# Patient Record
Sex: Male | Born: 1940 | Race: White | Hispanic: No | Marital: Married | Smoking: Never smoker
Health system: Southern US, Community
[De-identification: ages and names within clinical notes are randomized; demographics above are authoritative.]

## PROBLEM LIST (undated history)

## (undated) DIAGNOSIS — D126 Benign neoplasm of colon, unspecified: Secondary | ICD-10-CM

## (undated) DIAGNOSIS — N400 Enlarged prostate without lower urinary tract symptoms: Secondary | ICD-10-CM

## (undated) DIAGNOSIS — E785 Hyperlipidemia, unspecified: Secondary | ICD-10-CM

## (undated) DIAGNOSIS — N429 Disorder of prostate, unspecified: Secondary | ICD-10-CM

## (undated) DIAGNOSIS — C439 Malignant melanoma of skin, unspecified: Secondary | ICD-10-CM

## (undated) DIAGNOSIS — E0500 Thyrotoxicosis with diffuse goiter without thyrotoxic crisis or storm: Secondary | ICD-10-CM

## (undated) DIAGNOSIS — Z87442 Personal history of urinary calculi: Secondary | ICD-10-CM

## (undated) DIAGNOSIS — I6529 Occlusion and stenosis of unspecified carotid artery: Secondary | ICD-10-CM

## (undated) DIAGNOSIS — G43119 Migraine with aura, intractable, without status migrainosus: Secondary | ICD-10-CM

## (undated) DIAGNOSIS — K219 Gastro-esophageal reflux disease without esophagitis: Secondary | ICD-10-CM

## (undated) DIAGNOSIS — R0600 Dyspnea, unspecified: Secondary | ICD-10-CM

## (undated) DIAGNOSIS — J302 Other seasonal allergic rhinitis: Secondary | ICD-10-CM

## (undated) DIAGNOSIS — M1990 Unspecified osteoarthritis, unspecified site: Secondary | ICD-10-CM

## (undated) DIAGNOSIS — K648 Other hemorrhoids: Secondary | ICD-10-CM

## (undated) DIAGNOSIS — I639 Cerebral infarction, unspecified: Secondary | ICD-10-CM

## (undated) DIAGNOSIS — E079 Disorder of thyroid, unspecified: Secondary | ICD-10-CM

## (undated) HISTORY — PX: LITHOTRIPSY: SUR834

## (undated) HISTORY — DX: Benign neoplasm of colon, unspecified: D12.6

## (undated) HISTORY — PX: BACK SURGERY: SHX140

## (undated) HISTORY — DX: Disorder of prostate, unspecified: N42.9

## (undated) HISTORY — PX: ESOPHAGOGASTRODUODENOSCOPY: SHX1529

## (undated) HISTORY — DX: Occlusion and stenosis of unspecified carotid artery: I65.29

## (undated) HISTORY — PX: MELANOMA EXCISION: SHX5266

## (undated) HISTORY — DX: Hyperlipidemia, unspecified: E78.5

## (undated) HISTORY — DX: Unspecified osteoarthritis, unspecified site: M19.90

## (undated) HISTORY — PX: COLONOSCOPY: SHX174

## (undated) HISTORY — DX: Benign prostatic hyperplasia without lower urinary tract symptoms: N40.0

## (undated) HISTORY — DX: Migraine with aura, intractable, without status migrainosus: G43.119

## (undated) HISTORY — PX: HYDROCELE EXCISION: SHX482

## (undated) HISTORY — DX: Disorder of thyroid, unspecified: E07.9

## (undated) HISTORY — DX: Other hemorrhoids: K64.8

---

## 1997-11-19 ENCOUNTER — Emergency Department (HOSPITAL_COMMUNITY): Admission: EM | Admit: 1997-11-19 | Discharge: 1997-11-19 | Payer: Self-pay | Admitting: Emergency Medicine

## 2003-08-06 ENCOUNTER — Ambulatory Visit (HOSPITAL_COMMUNITY): Admission: EM | Admit: 2003-08-06 | Discharge: 2003-08-06 | Payer: Self-pay | Admitting: Emergency Medicine

## 2003-08-17 ENCOUNTER — Ambulatory Visit (HOSPITAL_COMMUNITY): Admission: RE | Admit: 2003-08-17 | Discharge: 2003-08-17 | Payer: Self-pay | Admitting: Urology

## 2009-09-04 ENCOUNTER — Ambulatory Visit (HOSPITAL_BASED_OUTPATIENT_CLINIC_OR_DEPARTMENT_OTHER): Admission: RE | Admit: 2009-09-04 | Discharge: 2009-09-04 | Payer: Self-pay | Admitting: Urology

## 2010-10-09 LAB — POCT HEMOGLOBIN-HEMACUE: Hemoglobin: 15.4 g/dL (ref 13.0–17.0)

## 2010-12-06 NOTE — Op Note (Signed)
NAMECHIEF, WALKUP                            ACCOUNT NO.:  [id number]   MEDICAL RECORD NO.:  [id number]                   PATIENT TYPE:  EMS   LOCATION:  ED                                   FACILITY:  [facility]   PHYSICIAN:  Bertram Millard. Dahlstedt, M.D.          DATE OF BIRTH:  09-22-1940   DATE OF PROCEDURE:  08/06/2003  DATE OF DISCHARGE:                                 OPERATIVE REPORT   PREOPERATIVE DIAGNOSIS:  Right upper ureteral stone.   POSTOPERATIVE DIAGNOSIS:  Right upper ureteral stone.   PRINCIPAL PROCEDURE:  1. Cystoscopy.  2. Double-J stent placement.  3. Interpretive fluoroscopy.   SURGEON:  Bertram Millard. Dahlstedt, M.D.   ANESTHESIA:  General endotracheal.   COMPLICATIONS:  None.   BRIEF HISTORY:  A 70 year old gentleman, who has had intermittent right back  pain for the past 2-3 weeks.  He had a flare-up of this about a week ago and  then this morning at 8:00, had severe onset of right flank pain associated  with nausea and vomiting.  It took quite a bit of Dilaudid in the emergency  room here at [facility] to get the patient comfortable.  Evaluation  revealed a right UPJ stone, measuring about 7 mm in size as well as one  other right renal stone.   I saw the patient in the emergency room.  As he has had intermittent pain  for a few weeks and a moderate-sized stone as well as significant  hydronephrosis, I recommended stent placement followed by lithotripsy.  I  would prefer in situ lithotripsy tomorrow, but the patient has been on  aspirin.   He presents at this time for cystoscopy and double-J stent placement.  Risks  and complications as well as alternatives have been discussed.   DESCRIPTION OF PROCEDURE:  The patient was administered 1 g of Ancef in the  holding area and taken to the operating room where a general anesthetic was  administered.  He was placed in the dorsal lithotomy position.  Genitalia  and perineum were prepped and draped.  A 22 French  panendoscope was advanced  through his urethra which was normal.  Prostate was nonobstructive.  The  bladder was then entered and inspected circumferentially.  It was normal.  The right ureteral orifice was cannulated with a 0.038 inch guidewire.  This  was navigated past the stone which was quite tight at the UPJ.  Over top of  this, a 6 French x 24 cm double-J stent was placed.  The string  was removed.  Good curls were seen proximally and distally using  fluoroscopic and cystoscopic views, respectively.  The bladder was drained  and the procedure terminated.  The patient tolerated the procedure well.  He  was awakened and taken to the PACU in stable condition.  Bertram Millard. Dahlstedt, M.D.    SMD/MEDQ  D:  08/06/2003  T:  08/06/2003  Job:  [id number]   cc:   Ernestina Penna, M.D.  [address] [place]  [place] [address]  Fax: [phone]

## 2012-01-26 ENCOUNTER — Ambulatory Visit: Payer: Medicare Other | Attending: Orthopedic Surgery | Admitting: Physical Therapy

## 2012-01-26 DIAGNOSIS — R5381 Other malaise: Secondary | ICD-10-CM | POA: Insufficient documentation

## 2012-01-26 DIAGNOSIS — IMO0001 Reserved for inherently not codable concepts without codable children: Secondary | ICD-10-CM | POA: Insufficient documentation

## 2012-01-26 DIAGNOSIS — M25579 Pain in unspecified ankle and joints of unspecified foot: Secondary | ICD-10-CM | POA: Insufficient documentation

## 2012-01-30 ENCOUNTER — Ambulatory Visit: Payer: Medicare Other | Admitting: *Deleted

## 2012-02-02 ENCOUNTER — Ambulatory Visit: Payer: Medicare Other | Admitting: Physical Therapy

## 2012-02-06 ENCOUNTER — Ambulatory Visit: Payer: Medicare Other | Admitting: *Deleted

## 2012-11-13 ENCOUNTER — Other Ambulatory Visit: Payer: Self-pay | Admitting: Family Medicine

## 2012-11-15 NOTE — Telephone Encounter (Signed)
LAST OV 6/13

## 2012-12-27 ENCOUNTER — Encounter: Payer: Self-pay | Admitting: General Practice

## 2012-12-27 ENCOUNTER — Ambulatory Visit (INDEPENDENT_AMBULATORY_CARE_PROVIDER_SITE_OTHER): Payer: Medicare Other | Admitting: General Practice

## 2012-12-27 VITALS — BP 138/76 | HR 70 | Temp 97.3°F | Ht 67.0 in | Wt 183.0 lb

## 2012-12-27 DIAGNOSIS — H6120 Impacted cerumen, unspecified ear: Secondary | ICD-10-CM

## 2012-12-27 DIAGNOSIS — H6122 Impacted cerumen, left ear: Secondary | ICD-10-CM

## 2012-12-27 NOTE — Progress Notes (Signed)
  Subjective:    Patient ID: Jacob Conner, male    DOB: [birth year], 72 y.o.   MRN: [id number]  HPI Presents today with ear pain and clogged sensation which started one week ago. Reports cleaning ears with Q-tip and symptoms started. Denies OTC medications.    Review of Systems  Constitutional: Negative for fever and chills.  HENT: Positive for ear pain. Negative for hearing loss, congestion, rhinorrhea, sneezing, neck pain, neck stiffness, postnasal drip and sinus pressure.        Left ear pain and discomfort  Eyes: Negative for pain.  Respiratory: Negative for chest tightness and shortness of breath.   Cardiovascular: Negative for chest pain and palpitations.  All other systems reviewed and are negative.       Objective:   Physical Exam  Constitutional: He is oriented to person, place, and time. He appears well-developed and well-nourished.  HENT:  Head: Normocephalic and atraumatic.  Right Ear: Tympanic membrane, external ear and ear canal normal.  Left Ear: There is tenderness.  Mouth/Throat: Oropharynx is clear and moist.  Cerumen impaction noted to left ear canal, TM non visible at this time  Eyes: EOM are normal.  Cardiovascular: Normal rate, regular rhythm and normal heart sounds.   Pulmonary/Chest: Effort normal and breath sounds normal. No respiratory distress. He exhibits no tenderness.  Neurological: He is alert and oriented to person, place, and time.  Skin: Skin is warm and dry.  Psychiatric: He has a normal mood and affect.          Assessment & Plan:  1. Cerumen impaction, left -left ear irrigation (small amount of cerumen removed) -TM more visible -patient reports less clogged sensation -RTO on Wednesday or Thursday for recheck and sooner if symptoms worsen -refrain from placing any objects in ears for cleaning -Patient verbalized understanding -Coralie Keens, FNP-C

## 2012-12-27 NOTE — Patient Instructions (Signed)
Cerumen Impaction A cerumen impaction is when the wax in your ear forms a plug. This plug usually causes reduced hearing. Sometimes it also causes an earache or dizziness. Removing a cerumen impaction can be difficult and painful. The wax sticks to the ear canal. The canal is sensitive and bleeds easily. If you try to remove a heavy wax buildup with a cotton tipped swab, you may push it in further. Irrigation with water, suction, and small ear curettes may be used to clear out the wax. If the impaction is fixed to the skin in the ear canal, ear drops may be needed for a few days to loosen the wax. People who build up a lot of wax frequently can use ear wax removal products available in your local drugstore. SEEK MEDICAL CARE IF:  You develop an earache, increased hearing loss, or marked dizziness. Document Released: 08/14/2004 Document Revised: 09/29/2011 Document Reviewed: 10/04/2009 Exit[employer] Patient Information 2014 [employer].  

## 2012-12-31 ENCOUNTER — Other Ambulatory Visit: Payer: Self-pay | Admitting: General Practice

## 2012-12-31 DIAGNOSIS — H6122 Impacted cerumen, left ear: Secondary | ICD-10-CM

## 2013-03-25 ENCOUNTER — Other Ambulatory Visit: Payer: Self-pay | Admitting: Family Medicine

## 2013-03-29 NOTE — Telephone Encounter (Signed)
No labwork since 2012

## 2013-04-05 ENCOUNTER — Ambulatory Visit (INDEPENDENT_AMBULATORY_CARE_PROVIDER_SITE_OTHER): Payer: Medicare Other | Admitting: Family Medicine

## 2013-04-05 ENCOUNTER — Encounter: Payer: Self-pay | Admitting: Family Medicine

## 2013-04-05 VITALS — BP 116/63 | HR 77 | Temp 97.7°F | Wt 186.3 lb

## 2013-04-05 DIAGNOSIS — H109 Unspecified conjunctivitis: Secondary | ICD-10-CM

## 2013-04-05 MED ORDER — POLYMYXIN B-TRIMETHOPRIM 10000-0.1 UNIT/ML-% OP SOLN: 1.0000 [drp] | OPHTHALMIC | Status: DC

## 2013-04-05 NOTE — Patient Instructions (Signed)

## 2013-04-05 NOTE — Progress Notes (Signed)
  Subjective:    Patient ID: Jacob Conner, male    DOB: [birth year], 72 y.o.   MRN: [id number]  HPI  This 72 y.o. male presents for evaluation of eye swelling and irritation for A week.  His wife states she has noticed that he has swelling and redness in Is bilateral conjunctival sacs..  Review of Systems C/o bilateral eye irritation and redness.   No chest pain, SOB, HA, dizziness, vision change, N/V, diarrhea, constipation, dysuria, urinary urgency or frequency, myalgias, arthralgias or rash.  Objective:   Physical Exam Vital signs noted  Well developed well nourished male.  HEENT - Head atraumatic Normocephalic                Eyes - PERRLA, Conjuctiva - injected and with edema bilateral Sclera- Clear EOMI                Ears - EAC's Wnl TM's Wnl Gross Hearing WNL                Nose - Nares patent                 Throat - oropharanx wnl Respiratory - Lungs CTA bilateral Cardiac - RRR S1 and S2 without murmur        Assessment & Plan:  Conjunctivitis - Plan: trimethoprim-polymyxin b (POLYTRIM) ophthalmic solution 1-2 gtt's OU qid x 7days

## 2013-04-19 ENCOUNTER — Ambulatory Visit (INDEPENDENT_AMBULATORY_CARE_PROVIDER_SITE_OTHER): Payer: Medicare Other | Admitting: Family Medicine

## 2013-04-19 ENCOUNTER — Ambulatory Visit (INDEPENDENT_AMBULATORY_CARE_PROVIDER_SITE_OTHER): Payer: Medicare Other

## 2013-04-19 ENCOUNTER — Encounter: Payer: Self-pay | Admitting: Family Medicine

## 2013-04-19 VITALS — BP 135/78 | HR 67 | Temp 97.1°F | Ht 66.0 in | Wt 185.0 lb

## 2013-04-19 DIAGNOSIS — E785 Hyperlipidemia, unspecified: Secondary | ICD-10-CM | POA: Insufficient documentation

## 2013-04-19 DIAGNOSIS — N4340 Spermatocele of epididymis, unspecified: Secondary | ICD-10-CM | POA: Insufficient documentation

## 2013-04-19 DIAGNOSIS — H109 Unspecified conjunctivitis: Secondary | ICD-10-CM

## 2013-04-19 DIAGNOSIS — Z77090 Contact with and (suspected) exposure to asbestos: Secondary | ICD-10-CM

## 2013-04-19 DIAGNOSIS — N400 Enlarged prostate without lower urinary tract symptoms: Secondary | ICD-10-CM

## 2013-04-19 DIAGNOSIS — G8929 Other chronic pain: Secondary | ICD-10-CM | POA: Insufficient documentation

## 2013-04-19 DIAGNOSIS — Z8669 Personal history of other diseases of the nervous system and sense organs: Secondary | ICD-10-CM

## 2013-04-19 DIAGNOSIS — R609 Edema, unspecified: Secondary | ICD-10-CM

## 2013-04-19 DIAGNOSIS — Z0000 Encounter for general adult medical examination without abnormal findings: Secondary | ICD-10-CM

## 2013-04-19 DIAGNOSIS — Z86010 Personal history of colonic polyps: Secondary | ICD-10-CM | POA: Insufficient documentation

## 2013-04-19 DIAGNOSIS — W57XXXA Bitten or stung by nonvenomous insect and other nonvenomous arthropods, initial encounter: Secondary | ICD-10-CM

## 2013-04-19 DIAGNOSIS — M25559 Pain in unspecified hip: Secondary | ICD-10-CM

## 2013-04-19 DIAGNOSIS — Z23 Encounter for immunization: Secondary | ICD-10-CM

## 2013-04-19 LAB — POCT CBC
Granulocyte percent: 65.7 %G (ref 37–80)
HCT, POC: 44.8 % (ref 43.5–53.7)
Hemoglobin: 14.5 g/dL (ref 14.1–18.1)
Lymph, poc: 1.9 (ref 0.6–3.4)
MCH, POC: 28.9 pg (ref 27–31.2)
MCHC: 32.4 g/dL (ref 31.8–35.4)
MCV: 89.2 fL (ref 80–97)
MPV: 6.4 fL (ref 0–99.8)
POC Granulocyte: 4.3 (ref 2–6.9)
POC LYMPH PERCENT: 28.8 %L (ref 10–50)
Platelet Count, POC: 338 10*3/uL (ref 142–424)
RBC: 5 M/uL (ref 4.69–6.13)
RDW, POC: 12.2 %
WBC: 6.6 10*3/uL (ref 4.6–10.2)

## 2013-04-19 LAB — POCT URINALYSIS DIPSTICK
Bilirubin, UA: NEGATIVE
Blood, UA: NEGATIVE
Glucose, UA: NEGATIVE
Ketones, UA: NEGATIVE
Leukocytes, UA: NEGATIVE
Nitrite, UA: NEGATIVE
Protein, UA: NEGATIVE
Spec Grav, UA: 1.015
Urobilinogen, UA: NEGATIVE
pH, UA: 5

## 2013-04-19 LAB — POCT UA - MICROSCOPIC ONLY
Bacteria, U Microscopic: NEGATIVE
Casts, Ur, LPF, POC: NEGATIVE
Crystals, Ur, HPF, POC: NEGATIVE
Epithelial cells, urine per micros: NEGATIVE
Mucus, UA: NEGATIVE
RBC, urine, microscopic: NEGATIVE
WBC, Ur, HPF, POC: NEGATIVE
Yeast, UA: NEGATIVE

## 2013-04-19 MED ORDER — CEPHALEXIN 500 MG PO CAPS: 500.0000 mg | ORAL_CAPSULE | Freq: Three times a day (TID) | ORAL | Status: DC

## 2013-04-19 MED ORDER — FINASTERIDE 5 MG PO TABS: ORAL_TABLET | ORAL | Status: DC

## 2013-04-19 MED ORDER — TOBRAMYCIN 0.3 % OP SOLN: 1.0000 [drp] | OPHTHALMIC | Status: DC

## 2013-04-19 NOTE — Progress Notes (Addendum)
Subjective:    Patient ID: Jacob Conner, male    DOB: April 05, 1941, 72 y.o.   MRN: [id number]  HPIpt here today for complete physical. Patient complains of conjunctivitis for a couple weeks with no response with current treatment. Patient also complains of edema in the lower ankles for 2-3 months. Another complaint is continuous fecal leakage after bowel movements. There is no blood noted. Another important bit of information has to do with taking Naprosyn 500 mg twice daily.    There are no active problems to display for this patient.  Outpatient Encounter Prescriptions as of 04/19/2013  Medication Sig Dispense Refill  . aspirin EC 81 MG tablet Take 81 mg by mouth daily.      . clobetasol cream (TEMOVATE) 0.05 %       . finasteride (PROSCAR) 5 MG tablet TAKE 1 TABLET ONCE DAILY.  30 tablet  0  . naproxen (NAPROSYN) 500 MG tablet       . trimethoprim-polymyxin b (POLYTRIM) ophthalmic solution Place 1 drop into the left eye every 4 (four) hours.  10 mL  0  . UNABLE TO FIND Take 1 tablet by mouth daily. Med Name: occuvit- eye vit      . [DISCONTINUED] Vitamin Mixture (VITAMIN E COMPLETE PO) Take by mouth.       No facility-administered encounter medications on file as of 04/19/2013.       Review of Systems  Constitutional: Negative.   HENT: Negative.   Eyes: Positive for discharge (matted and swollen every am).  Respiratory: Negative.   Cardiovascular: Positive for leg swelling.  Gastrointestinal: Negative.   Endocrine: Negative.   Genitourinary: Negative.   Musculoskeletal: Negative.   Skin: Negative.   Allergic/Immunologic: Negative.   Neurological: Negative.   Hematological: Negative.   Psychiatric/Behavioral: Negative.        Objective:   Physical Exam  Nursing note and vitals reviewed. Constitutional: He is oriented to person, place, and time. He appears well-developed and well-nourished. No distress.  HENT:  Head: Normocephalic and atraumatic.  Right Ear: External  ear normal.  Left Ear: External ear normal.  Nose: Nose normal.  Mouth/Throat: Oropharynx is clear and moist. No oropharyngeal exudate.  Eyes: EOM are normal. Pupils are equal, round, and reactive to light. Right eye exhibits discharge. Left eye exhibits discharge. No scleral icterus.  Patient has bilateral edema in both eyes with red conjunctiva and matting of both eyes  Neck: Normal range of motion. Neck supple. No tracheal deviation present. No thyromegaly present.  Cardiovascular: Normal rate, regular rhythm, normal heart sounds and intact distal pulses.  Exam reveals no gallop and no friction rub.   No murmur heard. At 72 per minute  Pulmonary/Chest: Effort normal and breath sounds normal. No respiratory distress. He has no wheezes. He has no rales. He exhibits no tenderness.  Abdominal: Soft. Bowel sounds are normal. He exhibits no mass. There is no tenderness. There is no rebound and no guarding.  Genitourinary: Rectum normal and penis normal.  The prostate is slightly enlarged without lumps. There were no rectal masses. There were no external hemorrhoids. 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  Musculoskeletal: Normal range of motion. He exhibits edema (slight ankle edema bilaterally). He exhibits no tenderness.  Lymphadenopathy:    He has no cervical adenopathy.  Neurological: He is alert and oriented to person, place, and time. He has normal reflexes. No cranial nerve deficit.  Skin: Skin is warm and dry. Rash noted. There is erythema. No pallor.  The  patient has what appears to be multiple bites on both lower extremities and abdomen  Psychiatric: He has a normal mood and affect. His behavior is normal. Judgment and thought content normal.   BP 135/78  Pulse 67  Temp(Src) 97.1 F (36.2 C) (Oral)  Ht 5\' 6"  (1.676 m)  Wt 185 lb (83.915 kg)  BMI 29.87 kg/m2  ZOX:[id number] from previous tracings  [facility] reading (PRIMARY) by  Dr.Jabree Pernice; chest x-ray; degenerative changes in spine  otherwise normal                                      Assessment & Plan:  1. Annual physical exam - POCT CBC - Hepatic function panel - BMP8+EGFR - PSA, total and free - NMR, lipoprofile - Vit D  25 hydroxy (rtn osteoporosis monitoring) - POCT UA - Microscopic Only - POCT urinalysis dipstick - DG Chest 2 View; Future - EKG 12-Lead  2. Hyperlipidemia - Hepatic function panel - BMP8+EGFR - NMR, lipoprofile  3. BPH (benign prostatic hyperplasia) - PSA, total and free - POCT UA - Microscopic Only - POCT urinalysis dipstick  4. Chronic arthralgias of knees and hips, unspecified laterality  5. 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 Spermatocele  7. Hx of colonic polyp  8. History of migraine headaches  9. Edema  10. Conjunctivitis - tobramycin (TOBREX) 0.3 % ophthalmic solution; Place 1-2 drops into both eyes every 4 (four) hours.  Dispense: 5 mL; Refill: 1 - cephALEXin (KEFLEX) 500 MG capsule; Take 1 capsule (500 mg total) by mouth 3 (three) times daily.  Dispense: 30 capsule; Refill: 1  11. Multiple insect bites - cephALEXin (KEFLEX) 500 MG capsule; Take 1 capsule (500 mg total) by mouth 3 (three) times daily.   Dispense: 30 capsule; Refill: 1  Orders Placed This Encounter  Procedures  . DG Chest 2 View    Standing Status: Future     Number of Occurrences: 1     Standing Expiration Date: 06/19/2014    Order Specific Question:  Reason for Exam (SYMPTOM  OR DIAGNOSIS REQUIRED)    Answer:  annual    Order Specific Question:  Preferred imaging location?    Answer:  Internal  . Pneumococcal polysaccharide vaccine 23-valent greater than or equal to 2yo subcutaneous/IM  . Hepatic function panel  . BMP8+EGFR  . PSA, total and free  . NMR, lipoprofile  . Vit D  25 hydroxy (rtn osteoporosis monitoring)  . POCT CBC  . POCT UA - Microscopic Only  . POCT urinalysis dipstick  . EKG 12-Lead   Patient Instructions  Continue current medications. Continue good therapeutic lifestyle  changes.  Fall precautions discussed with patient. Follow up as planned and earlier as needed.     Return to clinic in 2 weeks for followup Discontinue Naprosyn as this may be contributing to edema Bring back FOBT

## 2013-04-19 NOTE — Patient Instructions (Signed)
Continue current medications. Continue good therapeutic lifestyle changes.  Fall precautions discussed with patient. Follow up as planned and earlier as needed.   

## 2013-04-21 LAB — BMP8+EGFR
BUN/Creatinine Ratio: 20 (ref 10–22)
BUN: 16 mg/dL (ref 8–27)
CO2: 28 mmol/L (ref 18–29)
Calcium: 9.8 mg/dL (ref 8.6–10.2)
Chloride: 100 mmol/L (ref 97–108)
Creatinine, Ser: 0.79 mg/dL (ref 0.76–1.27)
GFR calc Af Amer: 102 mL/min/{1.73_m2} (ref >59)
GFR calc non Af Amer: 88 mL/min/{1.73_m2} (ref >59)
Glucose: 93 mg/dL (ref 65–99)
Potassium: 4.4 mmol/L (ref 3.5–5.2)
Sodium: 142 mmol/L (ref 134–144)

## 2013-04-21 LAB — NMR, LIPOPROFILE
Cholesterol: 192 mg/dL (ref <200)
HDL Cholesterol by NMR: 35 mg/dL — ABNORMAL LOW (ref >=40)
HDL Particle Number: 26.7 umol/L — ABNORMAL LOW (ref >=30.5)
LDL Particle Number: 2084 nmol/L — ABNORMAL HIGH (ref <1000)
LDL Size: 20.1 nm — ABNORMAL LOW (ref >20.5)
LDLC SERPL CALC-MCNC: 123 mg/dL — ABNORMAL HIGH (ref <100)
LP-IR Score: 48 — ABNORMAL HIGH (ref <=45)
Small LDL Particle Number: 1312 nmol/L — ABNORMAL HIGH (ref <=527)
Triglycerides by NMR: 171 mg/dL — ABNORMAL HIGH (ref <150)

## 2013-04-21 LAB — HEPATIC FUNCTION PANEL
ALT: 16 IU/L (ref 0–44)
AST: 18 IU/L (ref 0–40)
Albumin: 4.5 g/dL (ref 3.5–4.8)
Alkaline Phosphatase: 126 IU/L — ABNORMAL HIGH (ref 39–117)
Bilirubin, Direct: 0.1 mg/dL (ref 0.00–0.40)
Total Bilirubin: 0.5 mg/dL (ref 0.0–1.2)
Total Protein: 6.5 g/dL (ref 6.0–8.5)

## 2013-04-21 LAB — PSA, TOTAL AND FREE
PSA, Free Pct: 24.3 %
PSA, Free: 0.17 ng/mL
PSA: 0.7 ng/mL (ref 0.0–4.0)

## 2013-04-21 LAB — VITAMIN D 25 HYDROXY (VIT D DEFICIENCY, FRACTURES): Vit D, 25-Hydroxy: 28.4 ng/mL — ABNORMAL LOW (ref 30.0–100.0)

## 2013-04-21 NOTE — Progress Notes (Signed)
Notified pt's wife of results Verbalizes understanding

## 2013-05-10 ENCOUNTER — Ambulatory Visit (INDEPENDENT_AMBULATORY_CARE_PROVIDER_SITE_OTHER): Payer: Medicare Other | Admitting: Family Medicine

## 2013-05-10 ENCOUNTER — Encounter: Payer: Self-pay | Admitting: Family Medicine

## 2013-05-10 VITALS — BP 117/65 | HR 82 | Temp 97.3°F | Ht 66.0 in | Wt 183.0 lb

## 2013-05-10 DIAGNOSIS — N400 Enlarged prostate without lower urinary tract symptoms: Secondary | ICD-10-CM

## 2013-05-10 DIAGNOSIS — R5381 Other malaise: Secondary | ICD-10-CM

## 2013-05-10 DIAGNOSIS — J309 Allergic rhinitis, unspecified: Secondary | ICD-10-CM

## 2013-05-10 DIAGNOSIS — E559 Vitamin D deficiency, unspecified: Secondary | ICD-10-CM

## 2013-05-10 DIAGNOSIS — M7989 Other specified soft tissue disorders: Secondary | ICD-10-CM

## 2013-05-10 DIAGNOSIS — E785 Hyperlipidemia, unspecified: Secondary | ICD-10-CM

## 2013-05-10 DIAGNOSIS — R42 Dizziness and giddiness: Secondary | ICD-10-CM

## 2013-05-10 MED ORDER — MECLIZINE HCL 12.5 MG PO TABS: ORAL_TABLET | ORAL | Status: DC

## 2013-05-10 MED ORDER — FLUTICASONE PROPIONATE 50 MCG/ACT NA SUSP: NASAL | Status: DC

## 2013-05-10 MED ORDER — ATORVASTATIN CALCIUM 10 MG PO TABS: 10.0000 mg | ORAL_TABLET | Freq: Every day | ORAL | Status: DC

## 2013-05-10 MED ORDER — VITAMIN D (ERGOCALCIFEROL) 1.25 MG (50000 UNIT) PO CAPS: 50000.0000 [IU] | ORAL_CAPSULE | ORAL | Status: DC

## 2013-05-10 NOTE — Progress Notes (Signed)
Subjective:    Patient ID: Jacob Conner, male    DOB: 1940-12-08, 72 y.o.   MRN: [id number]  HPI Patient here today due to film over eyes, legs swelling, and to go over recent labwork. Weight was 185 pounds the end of September is 183 pounds today. He does complain of some dizziness especially with certain head movements. He denies any symptoms of allergic rhinitis. He does complain of some increased shortness of breath with activity but no chest pain or tightness. He also complains of being issues specifically when he arises in the morning how slow it is to get his urine started. He also has symptoms of urgency. Since the last visit he has not taken any more Naprosyn.    Patient Active Problem List   Diagnosis Date Noted  . BPH (benign prostatic hyperplasia) 04/19/2013  . Hyperlipidemia 04/19/2013  . 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 Spermatocele 04/19/2013  . Hx of colonic polyp 04/19/2013  . History of migraine headaches 04/19/2013   Outpatient Encounter Prescriptions as of 05/10/2013  Medication Sig Dispense Refill  . aspirin EC 81 MG tablet Take 81 mg by mouth daily.      . clobetasol cream (TEMOVATE) 0.05 %       . finasteride (PROSCAR) 5 MG tablet TAKE 1 TABLET ONCE DAILY.  30 tablet  6  . naproxen (NAPROSYN) 500 MG tablet       . tobramycin (TOBREX) 0.3 % ophthalmic solution Place 1-2 drops into both eyes every 4 (four) hours.  5 mL  1  . trimethoprim-polymyxin b (POLYTRIM) ophthalmic solution Place 1 drop into the left eye every 4 (four) hours.  10 mL  0  . UNABLE TO FIND Take 1 tablet by mouth daily. Med Name: occuvit- eye vit      . [DISCONTINUED] cephALEXin (KEFLEX) 500 MG capsule Take 1 capsule (500 mg total) by mouth 3 (three) times daily.  30 capsule  1   No facility-administered encounter medications on file as of 05/10/2013.    Review of Systems  Constitutional: Negative.   HENT: Negative.   Eyes: Positive for discharge  (clear film).       "film" over eyes   Respiratory: Negative.   Cardiovascular: Positive for leg swelling (worse at night).  Gastrointestinal: Negative.   Endocrine: Negative.   Genitourinary: Negative.   Musculoskeletal: Negative.   Skin: Negative.   Allergic/Immunologic: Negative.   Neurological: Negative.   Hematological: Negative.   Psychiatric/Behavioral: Negative.        Objective:   Physical Exam  Nursing note and vitals reviewed. Constitutional: He is oriented to person, place, and time. He appears well-developed and well-nourished. No distress.  HENT:  Head: Normocephalic and atraumatic.  Right Ear: External ear normal.  Left Ear: External ear normal.  Mouth/Throat: Oropharynx is clear and moist. No oropharyngeal exudate.  Left ear has slight cerumen present There was some nasal congestion bilateral  Eyes: Conjunctivae and EOM are normal. Right eye exhibits no discharge. Left eye exhibits no discharge. No scleral icterus.  Patient continues to have congestion bilaterally  Neck: Normal range of motion. Neck supple. No thyromegaly present.  Cardiovascular: Normal rate, regular rhythm and normal heart sounds.  Exam reveals no gallop and no friction rub.   No murmur heard. At 84 per minute  Pulmonary/Chest: Effort normal and breath sounds normal. No respiratory distress. He has no wheezes. He has no rales. He exhibits no tenderness.  Musculoskeletal: Normal range  of motion. He exhibits no tenderness. Edema: minimal edema today.  Lymphadenopathy:    He has no cervical adenopathy.  Neurological: He is alert and oriented to person, place, and time.  Skin: Skin is warm. No rash noted. He is not diaphoretic. No erythema. No pallor.  Healing insect bites on lower extremity  Psychiatric: He has a normal mood and affect. His behavior is normal. Judgment and thought content normal.   BP 117/65  Pulse 82  Temp(Src) 97.3 F (36.3 C) (Oral)  Ht 5\' 6"  (1.676 m)  Wt 183 lb  (83.008 kg)  BMI 29.55 kg/m2        Assessment & Plan:   1. Other malaise and fatigue   2. Leg swelling   3. Dizziness and giddiness   4. BPH (benign prostatic hyperplasia)   5. Allergic rhinitis   6. Hyperlipidemia   7. Vitamin D deficiency    Orders Placed This Encounter  Procedures  . Thyroid Panel With TSH   We will also make referrals to the cardiologist for a stress test and the urologist for his BPH  Meds ordered this encounter  Medications  . fluticasone (FLONASE) 50 MCG/ACT nasal spray    Sig: 1-2 sprays each nostril at bedtime    Dispense:  16 g    Refill:  6  . meclizine (ANTIVERT) 12.5 MG tablet    Sig: 1   4 times daily with meals and at bedtime    Dispense:  40 tablet    Refill:  1  . Vitamin D, Ergocalciferol, (DRISDOL) 50000 UNITS CAPS capsule    Sig: Take 1 capsule (50,000 Units total) by mouth every 7 (seven) days.    Dispense:  12 capsule    Refill:  1  . atorvastatin (LIPITOR) 10 MG tablet    Sig: Take 1 tablet (10 mg total) by mouth daily.    Dispense:  30 tablet    Refill:  3   Patient Instructions  Continue current medications. Continue good therapeutic lifestyle changes.  Fall precautions discussed with patient. Follow up as planned and earlier as needed.  Continue to watch sodium intake Take medication prescribed today as directed  continue to hold Naprosyn and if anything is needed for pain take extra strength Tylenol instead We will do referrals for a stress test , a visit with the urologist, and we will call you with the results of the thyroid profile once those test results are back   Nyra Capes MD

## 2013-05-10 NOTE — Addendum Note (Signed)
Addended by: Magdalene River on: 05/10/2013 09:01 AM   Modules accepted: Orders

## 2013-05-10 NOTE — Patient Instructions (Addendum)
Continue current medications. Continue good therapeutic lifestyle changes.  Fall precautions discussed with patient. Follow up as planned and earlier as needed.  Continue to watch sodium intake Take medication prescribed today as directed  continue to hold Naprosyn and if anything is needed for pain take extra strength Tylenol instead We will do referrals for a stress test , a visit with the urologist, and we will call you with the results of the thyroid profile once those test results are back

## 2013-05-11 ENCOUNTER — Other Ambulatory Visit: Payer: Self-pay | Admitting: *Deleted

## 2013-05-11 ENCOUNTER — Telehealth: Payer: Self-pay | Admitting: Family Medicine

## 2013-05-11 DIAGNOSIS — E0590 Thyrotoxicosis, unspecified without thyrotoxic crisis or storm: Secondary | ICD-10-CM

## 2013-05-11 LAB — THYROID PANEL WITH TSH
Free Thyroxine Index: 6.7 — ABNORMAL HIGH (ref 1.2–4.9)
T3 Uptake Ratio: 40 % — ABNORMAL HIGH (ref 24–39)
T4, Total: 16.7 ug/dL — ABNORMAL HIGH (ref 4.5–12.0)
TSH: 0.005 u[IU]/mL — ABNORMAL LOW (ref 0.450–4.500)

## 2013-05-11 NOTE — Telephone Encounter (Signed)
I called patient's wife with her husband's results of lab work from yesterday. The thyroid tests came back with him being very hyperthyroid. He will need a referral to a and endocrinologist. I am going to recommend Dr. Cherene Altes in [facility]. We will try to call and make arrangements to see him as soon as possible

## 2013-05-25 ENCOUNTER — Ambulatory Visit: Payer: Medicare Other | Admitting: Cardiology

## 2013-05-27 ENCOUNTER — Other Ambulatory Visit: Payer: Self-pay

## 2013-05-27 DIAGNOSIS — E0500 Thyrotoxicosis with diffuse goiter without thyrotoxic crisis or storm: Secondary | ICD-10-CM

## 2013-06-01 ENCOUNTER — Other Ambulatory Visit: Payer: Self-pay | Admitting: *Deleted

## 2013-06-03 ENCOUNTER — Other Ambulatory Visit (INDEPENDENT_AMBULATORY_CARE_PROVIDER_SITE_OTHER): Payer: Medicare Other

## 2013-06-03 DIAGNOSIS — E0500 Thyrotoxicosis with diffuse goiter without thyrotoxic crisis or storm: Secondary | ICD-10-CM

## 2013-06-03 NOTE — Progress Notes (Signed)
Labs collested for dr.kristen g hairston md fax [phone]

## 2013-06-04 LAB — T4, FREE: Free T4: 2.25 ng/dL — ABNORMAL HIGH (ref 0.82–1.77)

## 2013-06-04 LAB — BMP8+EGFR
BUN: 15 mg/dL (ref 8–27)
CO2: 26 mmol/L (ref 18–29)
Calcium: 10 mg/dL (ref 8.6–10.2)
Chloride: 102 mmol/L (ref 97–108)
Creatinine, Ser: 0.81 mg/dL (ref 0.76–1.27)
GFR calc non Af Amer: 88 mL/min/{1.73_m2} (ref >59)
Glucose: 110 mg/dL — ABNORMAL HIGH (ref 65–99)
Potassium: 4.8 mmol/L (ref 3.5–5.2)
Sodium: 141 mmol/L (ref 134–144)

## 2013-06-07 NOTE — Progress Notes (Signed)
Pt's wife notified of results. 

## 2013-06-08 ENCOUNTER — Encounter: Payer: Self-pay | Admitting: Cardiology

## 2013-06-08 ENCOUNTER — Ambulatory Visit (INDEPENDENT_AMBULATORY_CARE_PROVIDER_SITE_OTHER): Payer: Medicare Other | Admitting: Cardiology

## 2013-06-08 VITALS — BP 109/56 | HR 67 | Ht 66.0 in | Wt 178.0 lb

## 2013-06-08 DIAGNOSIS — R0609 Other forms of dyspnea: Secondary | ICD-10-CM

## 2013-06-08 DIAGNOSIS — R0600 Dyspnea, unspecified: Secondary | ICD-10-CM

## 2013-06-08 DIAGNOSIS — E0590 Thyrotoxicosis, unspecified without thyrotoxic crisis or storm: Secondary | ICD-10-CM

## 2013-06-08 NOTE — Patient Instructions (Signed)
Your physician recommends that you schedule a follow-up appointment in: 1 month with Dr. Branch. This appointment will be scheduled today before you leave.  Your physician recommends that you continue on your current medications as directed. Please refer to the Current Medication list given to you today.  Your physician has requested that you have an echocardiogram. Echocardiography is a painless test that uses sound waves to create images of your heart. It provides your doctor with information about the size and shape of your heart and how well your heart's chambers and valves are working. This procedure takes approximately one hour. There are no restrictions for this procedure.    

## 2013-06-08 NOTE — Progress Notes (Signed)
Clinical Summary Mr. Dorner is a 72 y.o.male referred by Dr Christell Constant for evaluation of dyspnea and lower extremity edema.   1. Dyspnea - reports some SOB and DOE over the same time. Still remains active, reports DOE at about 1/2 mile which is new. No palpitations at rest, though with exertion with activity.  No orthopnea, no PND. Leg swelling x 2 months - initiated on lasix yesterday by PCP  2. 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 Prostate disorder   . Allergy   . BPH (benign prostatic hyperplasia)   . Hyperlipidemia   . Arthritis      No Known Allergies   Current Outpatient Prescriptions  Medication Sig Dispense Refill  . aspirin EC 81 MG tablet Take 81 mg by mouth daily.      Marland Kitchen atorvastatin (LIPITOR) 10 MG tablet Take 1 tablet (10 mg total) by mouth daily.  30 tablet  3  . clobetasol cream (TEMOVATE) 0.05 %       . finasteride (PROSCAR) 5 MG tablet TAKE 1 TABLET ONCE DAILY.  30 tablet  6  . fluticasone (FLONASE) 50 MCG/ACT nasal spray 1-2 sprays each nostril at bedtime  16 g  6  . meclizine (ANTIVERT) 12.5 MG tablet 1   4 times daily with meals and at bedtime  40 tablet  1  . naproxen (NAPROSYN) 500 MG tablet       . tobramycin (TOBREX) 0.3 % ophthalmic solution Place 1-2 drops into both eyes every 4 (four) hours.  5 mL  1  . trimethoprim-polymyxin b (POLYTRIM) ophthalmic solution Place 1 drop into the left eye every 4 (four) hours.  10 mL  0  . UNABLE TO FIND Take 1 tablet by mouth daily. Med Name: occuvit- eye vit      . Vitamin D, Ergocalciferol, (DRISDOL) 50000 UNITS CAPS capsule Take 1 capsule (50,000 Units total) by mouth every 7 (seven) days.  12 capsule  1   No current facility-administered medications for this visit.     Past Surgical History  Procedure Laterality Date  . Lithotripsy    . Hydrocele excision       No Known  Allergies    Family History  Problem Relation Age of Onset  . Liver disease Mother      Social History Mr. Lewin reports that he has never smoked. His smokeless tobacco use includes Chew. Mr. Micke reports that he does not drink alcohol.   Review of Systems CONSTITUTIONAL: No weight loss, fever, chills, weakness or fatigue.  HEENT: Eyes: No visual loss, blurred vision, double vision or yellow sclerae.No hearing loss, sneezing, congestion, runny nose or sore throat.  SKIN: No rash or itching.  CARDIOVASCULAR: per HPI RESPIRATORY: per HPI  GASTROINTESTINAL: No anorexia, nausea, vomiting or diarrhea. No abdominal pain or blood.  GENITOURINARY: No burning on urination, no polyuria NEUROLOGICAL: No headache, dizziness, syncope, paralysis, ataxia, numbness or tingling in the extremities. No change in bowel or bladder control.  MUSCULOSKELETAL: No muscle, back pain, joint pain or stiffness.  LYMPHATICS: No enlarged nodes. No history of splenectomy.  PSYCHIATRIC: No history of depression or anxiety.  ENDOCRINOLOGIC: No reports of sweating, cold or heat intolerance. No polyuria or polydipsia.  Marland Kitchen   Physical Examination p 67 bp 109/56 Wt 178 lbs BMI 29 Gen: resting comfortably, no acute distress HEENT: no scleral icterus, pupils equal round  and reactive, no palptable cervical adenopathy,  CV: RRR, no m/r/g,no JVD, no carotid bruits Resp: Clear to auscultation bilaterally GI: abdomen is soft, non-tender, non-distended, normal bowel sounds, no hepatosplenomegaly MSK: extremities are warm, 3+ bilateral edema Skin: warm, no rash Neuro:  no focal deficits Psych: appropriate affect   Diagnostic Studies  04/19/13 EKG: SR, LAE, anteroseptal Q-waves  Pertinent labs 06/03/13: Free T4 2.25, Na 141 K 4.8 Cr 0.81 BUN 15 TSH 0.005 Hgb 14.5 Hct 44.8 LDL 123 HDL 35 TG 171 TC 192  Assessment and Plan  1. Dyspnea - mild dyspnea over the last few month with the development of significant  bilateral LE edema - will obtain an echocardiogram to evaluate for possible cardiac etiology of symptoms.       Antoine Poche, M.D., F.A.C.C.

## 2013-06-09 ENCOUNTER — Other Ambulatory Visit: Payer: Self-pay | Admitting: *Deleted

## 2013-06-09 MED ORDER — FUROSEMIDE 20 MG PO TABS: 20.0000 mg | ORAL_TABLET | Freq: Every day | ORAL | Status: DC

## 2013-06-15 ENCOUNTER — Other Ambulatory Visit (INDEPENDENT_AMBULATORY_CARE_PROVIDER_SITE_OTHER): Payer: Medicare Other

## 2013-06-15 ENCOUNTER — Other Ambulatory Visit: Payer: Self-pay

## 2013-06-15 DIAGNOSIS — R609 Edema, unspecified: Secondary | ICD-10-CM

## 2013-06-15 DIAGNOSIS — R0609 Other forms of dyspnea: Secondary | ICD-10-CM

## 2013-06-15 DIAGNOSIS — R0600 Dyspnea, unspecified: Secondary | ICD-10-CM

## 2013-06-28 ENCOUNTER — Telehealth: Payer: Self-pay | Admitting: Cardiology

## 2013-06-28 NOTE — Telephone Encounter (Signed)
Pt informed of results.

## 2013-06-28 NOTE — Telephone Encounter (Signed)
Message copied by Burnice Logan on Tue Jun 28, 2013 12:13 PM ------      Message from: [place] F      Created: Mon Jun 27, 2013  4:33 PM       Please let patient know his echo shows normal pumping function of the heart. The heart is a little bit stiff which can happen as we age, and that may be why he has had SOB and swelling in his legs. We will discuss further when I see him in follow up later this month                  Dina Rich MD ------

## 2013-07-05 ENCOUNTER — Encounter: Payer: Self-pay | Admitting: Cardiology

## 2013-07-05 ENCOUNTER — Ambulatory Visit (INDEPENDENT_AMBULATORY_CARE_PROVIDER_SITE_OTHER): Payer: Medicare Other | Admitting: Cardiology

## 2013-07-05 VITALS — BP 119/64 | HR 68 | Ht 66.0 in | Wt 177.4 lb

## 2013-07-05 DIAGNOSIS — I5189 Other ill-defined heart diseases: Secondary | ICD-10-CM

## 2013-07-05 DIAGNOSIS — I519 Heart disease, unspecified: Secondary | ICD-10-CM

## 2013-07-05 DIAGNOSIS — R0609 Other forms of dyspnea: Secondary | ICD-10-CM

## 2013-07-05 DIAGNOSIS — R0600 Dyspnea, unspecified: Secondary | ICD-10-CM

## 2013-07-05 NOTE — Patient Instructions (Signed)
Your physician recommends that you schedule a follow-up appointment in:  As needed  Your physician recommends that you continue on your current medications as directed. Please refer to the Current Medication list given to you today.  

## 2013-07-05 NOTE — Progress Notes (Signed)
Clinical Summary Mr. Portocarrero is a 72 y.o.male seen today for follow up of the following medical problems.    1. Dyspnea  - reports some SOB and DOE over the same time. Still remains active, reports DOE at about 1/2 mile which is new. No palpitations at rest, though with exertion and with activity. No orthopnea, no PND. Leg swelling x 2 months  - initiated on lasix by PCP with improved of LE edema - reports breathing is significantly improved.   2. Hyperthyroidism  - found on recent thyroid studies, recent diagnosis of Graves disease per family  - on methimazole   Past Medical History  Diagnosis Date  . Prostate disorder   . Allergy   . BPH (benign prostatic hyperplasia)   . Hyperlipidemia   . Arthritis      No Known Allergies   Current Outpatient Prescriptions  Medication Sig Dispense Refill  . Artificial Tear Ointment (ARTIFICIAL TEARS) ointment Place 1 application into both eyes as needed.      Marland Kitchen aspirin EC 81 MG tablet Take 81 mg by mouth daily.      . finasteride (PROSCAR) 5 MG tablet TAKE 1 TABLET ONCE DAILY.  30 tablet  6  . fluticasone (FLONASE) 50 MCG/ACT nasal spray Place 2 sprays into the nose daily as needed.      . furosemide (LASIX) 20 MG tablet Take 1 tablet (20 mg total) by mouth daily.  30 tablet  6  . methimazole (TAPAZOLE) 10 MG tablet Take 1 tablet by mouth 2 (two) times daily.      . Multiple Vitamins-Minerals (OCUVITE PO) Take 1 tablet by mouth daily.      . Vitamin D, Ergocalciferol, (DRISDOL) 50000 UNITS CAPS capsule Take 1 capsule (50,000 Units total) by mouth every 7 (seven) days.  12 capsule  1   No current facility-administered medications for this visit.     Past Surgical History  Procedure Laterality Date  . Lithotripsy    . Hydrocele excision       No Known Allergies    Family History  Problem Relation Age of Onset  . Liver disease Mother      Social History Mr. Haque reports that he has never smoked. His smokeless  tobacco use includes Chew. Mr. Beyl reports that he does not drink alcohol.   Review of Systems CONSTITUTIONAL: No weight loss, fever, chills, weakness or fatigue.  HEENT: Eyes: No visual loss, blurred vision, double vision or yellow sclerae.No hearing loss, sneezing, congestion, runny nose or sore throat.  SKIN: No rash or itching.  CARDIOVASCULAR: per HPI RESPIRATORY: No shortness of breath, cough or sputum.  GASTROINTESTINAL: No anorexia, nausea, vomiting or diarrhea. No abdominal pain or blood.  GENITOURINARY: No burning on urination, no polyuria NEUROLOGICAL: No headache, dizziness, syncope, paralysis, ataxia, numbness or tingling in the extremities. No change in bowel or bladder control.  MUSCULOSKELETAL: No muscle, back pain, joint pain or stiffness.  LYMPHATICS: No enlarged nodes. No history of splenectomy.  PSYCHIATRIC: No history of depression or anxiety.  ENDOCRINOLOGIC: No reports of sweating, cold or heat intolerance. No polyuria or polydipsia.  Marland Kitchen   Physical Examination   Gen: resting comfortably, no acute distress HEENT: no scleral icterus, pupils equal round and reactive, no palptable cervical adenopathy,  CV: RRR, no m/r/g, no JVD, no carotid bruits Resp: Clear to auscultation bilaterally GI: abdomen is soft, non-tender, non-distended, normal bowel sounds, no hepatosplenomegaly MSK: extremities are warm, no edema.  Skin: warm, no  rash Neuro:  no focal deficits Psych: appropriate affect   Diagnostic Studies 04/19/13 EKG: SR, LAE, anteroseptal Q-waves   Pertinent labs  06/03/13: Free T4 2.25, Na 141 K 4.8 Cr 0.81 BUN 15 TSH 0.005 Hgb 14.5 Hct 44.8 LDL 123 HDL 35 TG 171 TC 192  06/15/13 Echo LVEF 65-70%, mild LVH, grade II diastolic dysfunction    Assessment and Plan  1. Dyspnea  - improved since last visit, possibly related to grade II diastolic dysfunction. Volume status appears improved today - continue lasix as presrcibed - may improve as thyroid  function normalizes as well    May follow up in cardiology clinic as needed. No further cardiac testing or intervention planned at this time.    Antoine Poche, M.D., F.A.C.C.

## 2013-08-02 ENCOUNTER — Other Ambulatory Visit: Payer: Self-pay | Admitting: Family Medicine

## 2013-08-25 ENCOUNTER — Other Ambulatory Visit: Payer: Self-pay | Admitting: Family Medicine

## 2013-11-01 ENCOUNTER — Other Ambulatory Visit: Payer: Self-pay | Admitting: *Deleted

## 2013-11-01 DIAGNOSIS — R252 Cramp and spasm: Secondary | ICD-10-CM

## 2013-11-02 ENCOUNTER — Other Ambulatory Visit (INDEPENDENT_AMBULATORY_CARE_PROVIDER_SITE_OTHER): Payer: Medicare Other

## 2013-11-02 ENCOUNTER — Encounter (INDEPENDENT_AMBULATORY_CARE_PROVIDER_SITE_OTHER): Payer: Self-pay

## 2013-11-02 DIAGNOSIS — R252 Cramp and spasm: Secondary | ICD-10-CM

## 2013-11-02 LAB — POCT CBC
GRANULOCYTE PERCENT: 72.6 % (ref 37–80)
HEMATOCRIT: 50.7 % (ref 43.5–53.7)
Hemoglobin: 16.9 g/dL (ref 14.1–18.1)
LYMPH, POC: 1.8 (ref 0.6–3.4)
MCH, POC: 30.7 pg (ref 27–31.2)
MCHC: 33.3 g/dL (ref 31.8–35.4)
MCV: 92.1 fL (ref 80–97)
MPV: 6.9 fL (ref 0–99.8)
PLATELET COUNT, POC: 236 10*3/uL (ref 142–424)
POC GRANULOCYTE: 5.2 (ref 2–6.9)
POC LYMPH PERCENT: 25.5 %L (ref 10–50)
RBC: 5.5 M/uL (ref 4.69–6.13)
RDW, POC: 14.2 %
WBC: 7.2 10*3/uL (ref 4.6–10.2)

## 2013-11-02 NOTE — Progress Notes (Signed)
Pt came in for labs only 

## 2013-11-03 LAB — BMP8+EGFR
BUN/Creatinine Ratio: 20 (ref 10–22)
BUN: 20 mg/dL (ref 8–27)
CHLORIDE: 98 mmol/L (ref 97–108)
CO2: 27 mmol/L (ref 18–29)
Calcium: 9.9 mg/dL (ref 8.6–10.2)
Creatinine, Ser: 1.02 mg/dL (ref 0.76–1.27)
GFR, EST AFRICAN AMERICAN: 83 mL/min/{1.73_m2} (ref >59)
GFR, EST NON AFRICAN AMERICAN: 72 mL/min/{1.73_m2} (ref >59)
Glucose: 79 mg/dL (ref 65–99)
POTASSIUM: 3.9 mmol/L (ref 3.5–5.2)
Sodium: 141 mmol/L (ref 134–144)

## 2014-03-02 ENCOUNTER — Other Ambulatory Visit: Payer: Self-pay | Admitting: *Deleted

## 2014-03-02 DIAGNOSIS — E0500 Thyrotoxicosis with diffuse goiter without thyrotoxic crisis or storm: Secondary | ICD-10-CM

## 2014-03-03 ENCOUNTER — Other Ambulatory Visit (INDEPENDENT_AMBULATORY_CARE_PROVIDER_SITE_OTHER): Payer: Medicare Other

## 2014-03-03 DIAGNOSIS — E0500 Thyrotoxicosis with diffuse goiter without thyrotoxic crisis or storm: Secondary | ICD-10-CM

## 2014-03-04 LAB — TSH+FREE T4
FREE T4: 1.11 ng/dL (ref 0.82–1.77)
TSH: 5.98 u[IU]/mL — AB (ref 0.450–4.500)

## 2014-04-26 ENCOUNTER — Other Ambulatory Visit: Payer: Self-pay | Admitting: Family Medicine

## 2014-04-28 ENCOUNTER — Encounter: Payer: Self-pay | Admitting: Family Medicine

## 2014-04-28 ENCOUNTER — Ambulatory Visit (INDEPENDENT_AMBULATORY_CARE_PROVIDER_SITE_OTHER): Payer: Medicare Other

## 2014-04-28 ENCOUNTER — Ambulatory Visit (INDEPENDENT_AMBULATORY_CARE_PROVIDER_SITE_OTHER): Payer: Medicare Other | Admitting: Family Medicine

## 2014-04-28 ENCOUNTER — Encounter (INDEPENDENT_AMBULATORY_CARE_PROVIDER_SITE_OTHER): Payer: Self-pay

## 2014-04-28 VITALS — BP 109/67 | HR 64 | Temp 97.9°F | Ht 66.0 in | Wt 189.0 lb

## 2014-04-28 DIAGNOSIS — Z0000 Encounter for general adult medical examination without abnormal findings: Secondary | ICD-10-CM

## 2014-04-28 DIAGNOSIS — N400 Enlarged prostate without lower urinary tract symptoms: Secondary | ICD-10-CM

## 2014-04-28 DIAGNOSIS — E785 Hyperlipidemia, unspecified: Secondary | ICD-10-CM

## 2014-04-28 DIAGNOSIS — W57XXXA Bitten or stung by nonvenomous insect and other nonvenomous arthropods, initial encounter: Secondary | ICD-10-CM

## 2014-04-28 DIAGNOSIS — I700 Atherosclerosis of aorta: Secondary | ICD-10-CM

## 2014-04-28 DIAGNOSIS — E0590 Thyrotoxicosis, unspecified without thyrotoxic crisis or storm: Secondary | ICD-10-CM

## 2014-04-28 DIAGNOSIS — Z23 Encounter for immunization: Secondary | ICD-10-CM

## 2014-04-28 DIAGNOSIS — E559 Vitamin D deficiency, unspecified: Secondary | ICD-10-CM

## 2014-04-28 DIAGNOSIS — R8290 Unspecified abnormal findings in urine: Secondary | ICD-10-CM

## 2014-04-28 LAB — POCT URINALYSIS DIPSTICK
Bilirubin, UA: NEGATIVE
Blood, UA: NEGATIVE
Glucose, UA: NEGATIVE
Ketones, UA: NEGATIVE
NITRITE UA: NEGATIVE
Protein, UA: NEGATIVE
Spec Grav, UA: 1.025
UROBILINOGEN UA: NEGATIVE
pH, UA: 6

## 2014-04-28 LAB — POCT UA - MICROSCOPIC ONLY
CASTS, UR, LPF, POC: NEGATIVE
Crystals, Ur, HPF, POC: NEGATIVE
YEAST UA: NEGATIVE

## 2014-04-28 NOTE — Addendum Note (Signed)
Addended by: Zannie Cove on: 04/28/2014 12:40 PM   Modules accepted: Orders

## 2014-04-28 NOTE — Addendum Note (Signed)
Addended by: Pollyann Kennedy F on: 04/28/2014 05:11 PM   Modules accepted: Orders

## 2014-04-28 NOTE — Patient Instructions (Addendum)
Medicare Annual Wellness Visit  [facility] and the medical providers at [facility] strive to bring you the best medical care.  In doing so we not only want to address your current medical conditions and concerns but also to detect new conditions early and prevent illness, disease and health-related problems.    Medicare offers a yearly Wellness Visit which allows our clinical staff to assess your need for preventative services including immunizations, lifestyle education, counseling to decrease risk of preventable diseases and screening for fall risk and other medical concerns.    This visit is provided free of charge (no copay) for all Medicare recipients. The clinical pharmacists at [facility] have begun to conduct these Wellness Visits which will also include a thorough review of all your medications.    As you primary medical provider recommend that you make an appointment for your Annual Wellness Visit if you have not done so already this year.  You may set up this appointment before you leave today or you may call back ([phone]) and schedule an appointment.  Please make sure when you call that you mention that you are scheduling your Annual Wellness Visit with the clinical pharmacist so that the appointment may be made for the proper length of time.     Continue current medications. Continue good therapeutic lifestyle changes which include good diet and exercise. Fall precautions discussed with patient. If an FOBT was given today- please return it to our front desk. If you are over 82 years old - you may need Prevnar 25 or the adult Pneumonia vaccine.  Flu Shots will be available at our office starting mid- September. Please call and schedule a FLU CLINIC APPOINTMENT.   Continue followup with specialists regarding Graves' disease Return  the FOBT Drink plenty of fluids

## 2014-04-28 NOTE — Progress Notes (Addendum)
Subjective:    Patient ID: Jacob Conner, male    DOB: 11-11-1940, 73 y.o.   MRN: [id number]  HPI Pt here for annual physical. The patient comes to the visit today with his wife. He is due to get a prostate and PSA. He is also due for his fecal occult blood test. He will return to the office fasting for lab work. He'll also get his flu shot today. Is significant pain with the patient is that he has been treated and continuing to be treated for hyperthyroidism. He is followed by the endocrinologist and the ophthalmologist at [facility]. He is also been seen by the cardiologist. His thyroid issues seem to be finally improving. The patient has a history of multiple insect bites and these are currently in the healing phase .        Patient Active Problem List   Diagnosis Date Noted  . Hyperthyroidism 06/08/2013  . BPH (benign prostatic hyperplasia) 04/19/2013  . Hyperlipidemia 04/19/2013  . 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 Spermatocele 04/19/2013  . Hx of colonic polyp 04/19/2013  . History of migraine headaches 04/19/2013   Outpatient Encounter Prescriptions as of 04/28/2014  Medication Sig  . Artificial Tear Ointment (ARTIFICIAL TEARS) ointment Place 1 application into both eyes as needed.  Marland Kitchen aspirin EC 81 MG tablet Take 81 mg by mouth daily.  . finasteride (PROSCAR) 5 MG tablet TAKE 1 TABLET ONCE DAILY.  . furosemide (LASIX) 20 MG tablet Take 1 tablet (20 mg total) by mouth daily.  . methimazole (TAPAZOLE) 10 MG tablet Take 1 tablet by mouth 2 (two) times daily.  . [DISCONTINUED] Multiple Vitamins-Minerals (OCUVITE PO) Take 1 tablet by mouth daily.  . [DISCONTINUED] ammonium lactate (LAC-HYDRIN) 12 % lotion AS DIRECTED  . [DISCONTINUED] predniSONE (DELTASONE) 20 MG tablet Take 60 mg by mouth daily.  . [DISCONTINUED] Propylene Glycol-Glycerin (SOOTHE) 0.6-0.6 % SOLN Apply to eye. As needed  . [DISCONTINUED] Vitamin D,  Ergocalciferol, (DRISDOL) 50000 UNITS CAPS capsule TAKE 1 CAPSULE ON THE SAME DAY EVERY WEEK.    Review of Systems  Constitutional: Negative.   HENT: Negative.   Eyes: Negative.   Respiratory: Negative.   Cardiovascular: Negative.   Gastrointestinal: Negative.   Endocrine: Negative.   Genitourinary: Negative.   Musculoskeletal: Negative.   Skin: Negative.   Allergic/Immunologic: Negative.   Neurological: Negative.   Hematological: Negative.   Psychiatric/Behavioral: Negative.        Objective:   Physical Exam  Nursing note and vitals reviewed. Constitutional: He is oriented to person, place, and time. He appears well-developed and well-nourished.  HENT:  Head: Normocephalic and atraumatic.  Right Ear: External ear normal.  Left Ear: External ear normal.  Mouth/Throat: Oropharynx is clear and moist. No oropharyngeal exudate.  Nasal congestion bilaterally  Eyes: Conjunctivae and EOM are normal. Pupils are equal, round, and reactive to light. Right eye exhibits no discharge. Left eye exhibits no discharge. No scleral icterus.  Neck: Normal range of motion. Neck supple. No thyromegaly present.  No carotid bruits or  adenopathy  Cardiovascular: Normal rate, regular rhythm, normal heart sounds and intact distal pulses.   No murmur heard. At 60 per minute  Pulmonary/Chest: Effort normal and breath sounds normal. No respiratory distress. He has no wheezes. He has no rales. He exhibits no tenderness.  No axillary adenopathy  Abdominal: Soft. Bowel sounds are normal. He exhibits no mass. There is no tenderness. There is no rebound  and no guarding.  No abdominal tenderness or masses  Genitourinary: Rectum normal and penis normal. No penile tenderness.  The prostate was slightly enlarged, but soft and smooth. There were no rectal masses. There were no inguinal hernias. The external genitalia were normal. There were no inguinal nodes.  Musculoskeletal: Normal range of motion. He  exhibits no edema and no tenderness.  Lymphadenopathy:    He has no cervical adenopathy.  Neurological: He is alert and oriented to person, place, and time. He has normal reflexes. No cranial nerve deficit.  Skin: Skin is warm and dry. Rash noted. No erythema. No pallor.  The patient has multiple healing bug bites on his lower extremities  Psychiatric: He has a normal mood and affect. His behavior is normal. Judgment and thought content normal.   BP 109/67  Pulse 64  Temp(Src) 97.9 F (36.6 C) (Oral)  Ht 5' 6"  (1.676 m)  Wt 189 lb (85.73 kg)  BMI 30.52 kg/m2  Preliminary reading by Chipper Herb, MD--chest x-ray- degenerative changes in the thoracic spine and atherosclerotic changes in the thoracic aorta       Assessment & Plan:  1. BPH (benign prostatic hyperplasia) - POCT UA - Microscopic Only - POCT urinalysis dipstick - POCT CBC; Future - PSA, total and free; Future  2. Hyperlipidemia - POCT CBC; Future - BMP8+EGFR; Future - Hepatic function panel; Future - NMR, lipoprofile; Future - DG Chest 2 View; Future  3. Hyperthyroidism - POCT CBC; Future - DG Chest 2 View; Future  4. Vitamin D deficiency - POCT CBC; Future - Vit D  25 hydroxy (rtn osteoporosis monitoring); Future  5. Physical exam, annual - DG Chest 2 View; Future  6. Multiple insect bites  7. Aortic arch atherosclerosis  Patient Instructions                       Medicare Annual Wellness Visit  [facility] and the medical providers at [facility] strive to bring you the best medical care.  In doing so we not only want to address your current medical conditions and concerns but also to detect new conditions early and prevent illness, disease and health-related problems.    Medicare offers a yearly Wellness Visit which allows our clinical staff to assess your need for preventative services including immunizations, lifestyle education, counseling to decrease risk of preventable  diseases and screening for fall risk and other medical concerns.    This visit is provided free of charge (no copay) for all Medicare recipients. The clinical pharmacists at [facility] have begun to conduct these Wellness Visits which will also include a thorough review of all your medications.    As you primary medical provider recommend that you make an appointment for your Annual Wellness Visit if you have not done so already this year.  You may set up this appointment before you leave today or you may call back ([phone]) and schedule an appointment.  Please make sure when you call that you mention that you are scheduling your Annual Wellness Visit with the clinical pharmacist so that the appointment may be made for the proper length of time.     Continue current medications. Continue good therapeutic lifestyle changes which include good diet and exercise. Fall precautions discussed with patient. If an FOBT was given today- please return it to our front desk. If you are over 79 years old - you may need Prevnar 63 or the adult Pneumonia  vaccine.  Flu Shots will be available at our office starting mid- September. Please call and schedule a FLU CLINIC APPOINTMENT.   Continue followup with specialists regarding Graves' disease Return  the FOBT Drink plenty of fluids   Arrie Senate MD

## 2014-04-30 LAB — URINE CULTURE

## 2014-05-05 ENCOUNTER — Other Ambulatory Visit (INDEPENDENT_AMBULATORY_CARE_PROVIDER_SITE_OTHER): Payer: Medicare Other

## 2014-05-05 DIAGNOSIS — E559 Vitamin D deficiency, unspecified: Secondary | ICD-10-CM

## 2014-05-05 DIAGNOSIS — E0590 Thyrotoxicosis, unspecified without thyrotoxic crisis or storm: Secondary | ICD-10-CM

## 2014-05-05 DIAGNOSIS — E785 Hyperlipidemia, unspecified: Secondary | ICD-10-CM

## 2014-05-05 DIAGNOSIS — N400 Enlarged prostate without lower urinary tract symptoms: Secondary | ICD-10-CM

## 2014-05-05 NOTE — Addendum Note (Signed)
Addended by: Selmer Dominion on: 05/05/2014 08:58 AM   Modules accepted: Orders

## 2014-05-05 NOTE — Progress Notes (Signed)
Lab only 

## 2014-05-06 LAB — BMP8+EGFR
BUN/Creatinine Ratio: 11 (ref 10–22)
BUN: 12 mg/dL (ref 8–27)
CHLORIDE: 101 mmol/L (ref 97–108)
CO2: 26 mmol/L (ref 18–29)
Calcium: 9.7 mg/dL (ref 8.6–10.2)
Creatinine, Ser: 1.1 mg/dL (ref 0.76–1.27)
GFR calc non Af Amer: 65 mL/min/{1.73_m2} (ref >59)
GFR, EST AFRICAN AMERICAN: 76 mL/min/{1.73_m2} (ref >59)
Glucose: 90 mg/dL (ref 65–99)
Potassium: 4.4 mmol/L (ref 3.5–5.2)
SODIUM: 140 mmol/L (ref 134–144)

## 2014-05-06 LAB — PSA, TOTAL AND FREE
PSA FREE: 0.1 ng/mL
PSA, Free Pct: 14.3 %
PSA: 0.7 ng/mL (ref 0.0–4.0)

## 2014-05-06 LAB — THYROID PANEL WITH TSH
Free Thyroxine Index: 1.9 (ref 1.2–4.9)
T3 Uptake Ratio: 26 % (ref 24–39)
T4, Total: 7.4 ug/dL (ref 4.5–12.0)
TSH: 5.42 u[IU]/mL — AB (ref 0.450–4.500)

## 2014-05-06 LAB — CBC WITH DIFFERENTIAL
Basophils Absolute: 0 10*3/uL (ref 0.0–0.2)
Basos: 0 %
Eos: 3 %
Eosinophils Absolute: 0.2 10*3/uL (ref 0.0–0.4)
HEMATOCRIT: 44.2 % (ref 37.5–51.0)
Hemoglobin: 15 g/dL (ref 12.6–17.7)
IMMATURE GRANULOCYTES: 0 %
Immature Grans (Abs): 0 10*3/uL (ref 0.0–0.1)
LYMPHS ABS: 1.5 10*3/uL (ref 0.7–3.1)
LYMPHS: 27 %
MCH: 30.4 pg (ref 26.6–33.0)
MCHC: 33.9 g/dL (ref 31.5–35.7)
MCV: 90 fL (ref 79–97)
MONOCYTES: 10 %
Monocytes Absolute: 0.6 10*3/uL (ref 0.1–0.9)
Neutrophils Absolute: 3.4 10*3/uL (ref 1.4–7.0)
Neutrophils Relative %: 60 %
PLATELETS: 289 10*3/uL (ref 150–379)
RBC: 4.94 x10E6/uL (ref 4.14–5.80)
RDW: 13 % (ref 12.3–15.4)
WBC: 5.6 10*3/uL (ref 3.4–10.8)

## 2014-05-06 LAB — NMR, LIPOPROFILE
Cholesterol: 237 mg/dL — ABNORMAL HIGH (ref 100–199)
HDL CHOLESTEROL BY NMR: 38 mg/dL — AB (ref >39)
HDL PARTICLE NUMBER: 27.4 umol/L — AB (ref >=30.5)
LDL Particle Number: 2202 nmol/L — ABNORMAL HIGH (ref <1000)
LDL Size: 20.1 nm (ref >20.5)
LDLC SERPL CALC-MCNC: 169 mg/dL — AB (ref 0–99)
LP-IR Score: 49 — ABNORMAL HIGH (ref <=45)
SMALL LDL PARTICLE NUMBER: 1167 nmol/L — AB (ref <=527)
Triglycerides by NMR: 148 mg/dL (ref 0–149)

## 2014-05-06 LAB — HEPATIC FUNCTION PANEL
ALBUMIN: 4.5 g/dL (ref 3.5–4.8)
ALK PHOS: 109 IU/L (ref 39–117)
ALT: 11 IU/L (ref 0–44)
AST: 17 IU/L (ref 0–40)
BILIRUBIN TOTAL: 0.5 mg/dL (ref 0.0–1.2)
Bilirubin, Direct: 0.09 mg/dL (ref 0.00–0.40)
Total Protein: 6.3 g/dL (ref 6.0–8.5)

## 2014-05-06 LAB — VITAMIN D 25 HYDROXY (VIT D DEFICIENCY, FRACTURES): Vit D, 25-Hydroxy: 22.8 ng/mL — ABNORMAL LOW (ref 30.0–100.0)

## 2014-05-17 ENCOUNTER — Other Ambulatory Visit: Payer: Self-pay | Admitting: *Deleted

## 2014-05-17 MED ORDER — VITAMIN D (ERGOCALCIFEROL) 1.25 MG (50000 UNIT) PO CAPS: 50000.0000 [IU] | ORAL_CAPSULE | ORAL | Status: DC

## 2014-05-24 ENCOUNTER — Ambulatory Visit: Payer: Medicare Other | Admitting: Nurse Practitioner

## 2014-05-25 ENCOUNTER — Encounter: Payer: Self-pay | Admitting: Nurse Practitioner

## 2014-05-25 ENCOUNTER — Ambulatory Visit (INDEPENDENT_AMBULATORY_CARE_PROVIDER_SITE_OTHER): Payer: Medicare Other | Admitting: Nurse Practitioner

## 2014-05-25 ENCOUNTER — Ambulatory Visit (INDEPENDENT_AMBULATORY_CARE_PROVIDER_SITE_OTHER): Payer: Medicare Other

## 2014-05-25 VITALS — BP 148/79 | HR 67 | Temp 97.0°F | Ht 66.0 in | Wt 188.6 lb

## 2014-05-25 DIAGNOSIS — M79645 Pain in left finger(s): Secondary | ICD-10-CM

## 2014-05-25 NOTE — Progress Notes (Signed)
   Subjective:    Patient ID: Jacob Conner, male    DOB: [birth year], 73 y.o.   MRN: [id number]  HPI Patient was working in his wood shop with a wood splitter - a piece of wood flew out of spliter and hit the tip of his left thumb- Happened a week ago and it is still bothering him.    Review of Systems  Constitutional: Negative.   HENT: Negative.   Respiratory: Negative.   Cardiovascular: Negative.   Genitourinary: Negative.   Neurological: Negative.   Psychiatric/Behavioral: Negative.   All other systems reviewed and are negative.      Objective:   Physical Exam  Constitutional: He is oriented to person, place, and time. He appears well-developed and well-nourished.  Cardiovascular: Normal rate, regular rhythm and normal heart sounds.   Pulmonary/Chest: Effort normal and breath sounds normal.  Musculoskeletal:  Pain along left distal PIP joint - pain with flexion and extension.  Neurological: He is alert and oriented to person, place, and time.  Skin: Skin is warm.  Psychiatric: He has a normal mood and affect. His behavior is normal. Judgment and thought content normal.   BP 148/79 mmHg  Pulse 67  Temp(Src) 97 F (36.1 C) (Oral)  Ht 5\' 6"  (1.676 m)  Wt 188 lb 9.6 oz (85.548 kg)  BMI 30.46 kg/m2  Xray- lots of degenerative changesto distal PIP joint- foreign body in soft tissue of left thumb-Preliminary reading by Ronnald Collum, FNP  [facility]        Assessment & Plan:   1. Thumb pain, left    Tylenol OTC Epsom salt soaks RTO prn  Mary-Margaret Hassell Done, FNP

## 2014-07-25 DIAGNOSIS — E0500 Thyrotoxicosis with diffuse goiter without thyrotoxic crisis or storm: Secondary | ICD-10-CM | POA: Diagnosis not present

## 2014-07-27 DIAGNOSIS — H2513 Age-related nuclear cataract, bilateral: Secondary | ICD-10-CM | POA: Diagnosis not present

## 2014-07-27 DIAGNOSIS — Z7982 Long term (current) use of aspirin: Secondary | ICD-10-CM | POA: Diagnosis not present

## 2014-07-27 DIAGNOSIS — E0500 Thyrotoxicosis with diffuse goiter without thyrotoxic crisis or storm: Secondary | ICD-10-CM | POA: Diagnosis not present

## 2014-07-27 DIAGNOSIS — Z85820 Personal history of malignant melanoma of skin: Secondary | ICD-10-CM | POA: Diagnosis not present

## 2014-07-27 DIAGNOSIS — H47233 Glaucomatous optic atrophy, bilateral: Secondary | ICD-10-CM | POA: Diagnosis not present

## 2014-07-27 DIAGNOSIS — H43812 Vitreous degeneration, left eye: Secondary | ICD-10-CM | POA: Diagnosis not present

## 2014-07-27 DIAGNOSIS — H04129 Dry eye syndrome of unspecified lacrimal gland: Secondary | ICD-10-CM | POA: Diagnosis not present

## 2014-07-27 DIAGNOSIS — H532 Diplopia: Secondary | ICD-10-CM | POA: Diagnosis not present

## 2014-08-26 DIAGNOSIS — B070 Plantar wart: Secondary | ICD-10-CM | POA: Diagnosis not present

## 2014-09-15 ENCOUNTER — Other Ambulatory Visit: Payer: Self-pay | Admitting: Family Medicine

## 2014-09-26 DIAGNOSIS — H903 Sensorineural hearing loss, bilateral: Secondary | ICD-10-CM | POA: Diagnosis not present

## 2014-10-24 DIAGNOSIS — H47233 Glaucomatous optic atrophy, bilateral: Secondary | ICD-10-CM | POA: Diagnosis not present

## 2014-10-24 DIAGNOSIS — H43812 Vitreous degeneration, left eye: Secondary | ICD-10-CM | POA: Diagnosis not present

## 2014-10-24 DIAGNOSIS — H04129 Dry eye syndrome of unspecified lacrimal gland: Secondary | ICD-10-CM | POA: Diagnosis not present

## 2014-10-24 DIAGNOSIS — Z87891 Personal history of nicotine dependence: Secondary | ICD-10-CM | POA: Diagnosis not present

## 2014-10-24 DIAGNOSIS — E0500 Thyrotoxicosis with diffuse goiter without thyrotoxic crisis or storm: Secondary | ICD-10-CM | POA: Diagnosis not present

## 2014-10-24 DIAGNOSIS — H2513 Age-related nuclear cataract, bilateral: Secondary | ICD-10-CM | POA: Diagnosis not present

## 2014-10-26 ENCOUNTER — Encounter (INDEPENDENT_AMBULATORY_CARE_PROVIDER_SITE_OTHER): Payer: Self-pay

## 2014-10-26 ENCOUNTER — Ambulatory Visit (INDEPENDENT_AMBULATORY_CARE_PROVIDER_SITE_OTHER): Payer: Medicare Other | Admitting: Family Medicine

## 2014-10-26 ENCOUNTER — Encounter: Payer: Self-pay | Admitting: Family Medicine

## 2014-10-26 VITALS — BP 138/80 | HR 60 | Temp 97.0°F | Ht 66.0 in | Wt 184.0 lb

## 2014-10-26 DIAGNOSIS — N400 Enlarged prostate without lower urinary tract symptoms: Secondary | ICD-10-CM | POA: Diagnosis not present

## 2014-10-26 DIAGNOSIS — E0590 Thyrotoxicosis, unspecified without thyrotoxic crisis or storm: Secondary | ICD-10-CM

## 2014-10-26 DIAGNOSIS — E559 Vitamin D deficiency, unspecified: Secondary | ICD-10-CM | POA: Diagnosis not present

## 2014-10-26 DIAGNOSIS — E785 Hyperlipidemia, unspecified: Secondary | ICD-10-CM

## 2014-10-26 LAB — POCT CBC
GRANULOCYTE PERCENT: 63.2 % (ref 37–80)
HCT, POC: 45.2 % (ref 43.5–53.7)
Hemoglobin: 15.2 g/dL (ref 14.1–18.1)
LYMPH, POC: 1.9 (ref 0.6–3.4)
MCH, POC: 30.2 pg (ref 27–31.2)
MCHC: 33.6 g/dL (ref 31.8–35.4)
MCV: 89.9 fL (ref 80–97)
MPV: 6.4 fL (ref 0–99.8)
POC GRANULOCYTE: 3.9 (ref 2–6.9)
POC LYMPH PERCENT: 30.8 %L (ref 10–50)
Platelet Count, POC: 305 10*3/uL (ref 142–424)
RBC: 5.02 M/uL (ref 4.69–6.13)
RDW, POC: 13 %
WBC: 6.1 10*3/uL (ref 4.6–10.2)

## 2014-10-26 MED ORDER — FUROSEMIDE 20 MG PO TABS: ORAL_TABLET | ORAL | Status: DC

## 2014-10-26 MED ORDER — FINASTERIDE 5 MG PO TABS: ORAL_TABLET | ORAL | Status: DC

## 2014-10-26 NOTE — Progress Notes (Signed)
Subjective:    Patient ID: Jacob Conner, male    DOB: [birth year], 74 y.o.   MRN: [id number]  HPI Pt here for follow up and management of chronic medical problems which includes hypertension and hyperlipidemia. He is taking medications regularly. The patient has a history of Graves' disease and he has been seeing the endocrinologist and the ophthalmologist regarding this. He saw both of them yesterday and thyroid functions were checked by them and everything is good with control of his Graves' disease. He is coming in today with his wife and is usually has minimal complaints he is due to get lab work today and will be given an FOBT to return. As usual he has minimal complaints. He denies chest pain, shortness of breath GI symptoms or problems with passing his water.        Patient Active Problem List   Diagnosis Date Noted  . Hyperthyroidism 06/08/2013  . BPH (benign prostatic hyperplasia) 04/19/2013  . Hyperlipidemia 04/19/2013  . 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 Spermatocele 04/19/2013  . Hx of colonic polyp 04/19/2013  . History of migraine headaches 04/19/2013   Outpatient Encounter Prescriptions as of 10/26/2014  Medication Sig  . Artificial Tear Ointment (ARTIFICIAL TEARS) ointment Place 1 application into both eyes as needed.  Marland Kitchen aspirin EC 81 MG tablet Take 81 mg by mouth daily.  . finasteride (PROSCAR) 5 MG tablet TAKE (1) TABLET BY MOUTH ONCE DAILY.  . furosemide (LASIX) 20 MG tablet TAKE (1) TABLET BY MOUTH ONCE DAILY.  . methimazole (TAPAZOLE) 5 MG tablet Take 1 tablet by mouth daily.  . Vitamin D, Ergocalciferol, (DRISDOL) 50000 UNITS CAPS capsule TAKE 1 CAPSULE BY MOUTH ONCE A WEEK.  . [DISCONTINUED] methimazole (TAPAZOLE) 10 MG tablet Take 1 tablet by mouth 2 (two) times daily.    Review of Systems  Constitutional: Negative.   HENT: Negative.   Eyes: Negative.   Respiratory: Negative.   Cardiovascular: Negative.     Gastrointestinal: Negative.   Endocrine: Negative.   Genitourinary: Negative.   Musculoskeletal: Negative.   Skin: Negative.   Allergic/Immunologic: Negative.   Neurological: Negative.   Hematological: Negative.   Psychiatric/Behavioral: Negative.        Objective:   Physical Exam  Constitutional: He is oriented to person, place, and time. He appears well-developed and well-nourished. No distress.  HENT:  Head: Normocephalic and atraumatic.  Right Ear: External ear normal.  Left Ear: External ear normal.  Mouth/Throat: Oropharynx is clear and moist. No oropharyngeal exudate.  Minimal nasal congestion bilaterally  Eyes: Conjunctivae and EOM are normal. Pupils are equal, round, and reactive to light. Right eye exhibits no discharge. Left eye exhibits no discharge. No scleral icterus.  Neck: Normal range of motion. Neck supple. No thyromegaly present.  No thyromegaly or masses in the neck.  Cardiovascular: Normal rate, regular rhythm and normal heart sounds.   No murmur heard. At 72/m  Pulmonary/Chest: Effort normal and breath sounds normal. No respiratory distress. He has no wheezes. He has no rales. He exhibits no tenderness.  Abdominal: Soft. Bowel sounds are normal. He exhibits no mass. There is no tenderness. There is no rebound and no guarding.  Musculoskeletal: Normal range of motion. He exhibits no edema.  Lymphadenopathy:    He has no cervical adenopathy.  Neurological: He is alert and oriented to person, place, and time. He has normal reflexes. No cranial nerve deficit.  Skin: Skin is warm and dry. No  rash noted. No erythema. No pallor.  Psychiatric: He has a normal mood and affect. His behavior is normal. Judgment and thought content normal.  Nursing note and vitals reviewed.  BP 153/85 mmHg  Pulse 60  Temp(Src) 97 F (36.1 C) (Oral)  Ht _0  (1.676 m)  Wt 184 lb (83.462 kg)  BMI 29.71 kg/m2   Repeat blood pressure 138/80 right arm sitting     Assessment &  Plan:  1. BPH (benign prostatic hyperplasia) -No problems with voiding at this point in time and prostate exam is not due today. - POCT CBC  2. Hyperlipidemia -The patient will continue with aggressive therapeutic lifestyle changes and any medication needed will be determined when lab work is returned - POCT CBC - BMP8+EGFR - Hepatic function panel - NMR, lipoprofile  3. Hyperthyroidism -This is being followed by his endocrinologist currently every 4 months. - POCT CBC  4. Vitamin D deficiency -We will determine the need for any vitamin D replacement when lab work is returned - POCT CBC - Vit D  25 hydroxy (rtn osteoporosis monitoring)  5. Hyperlipidemia -Continue with aggressive therapeutic lifestyle changes pending results of lab work - POCT CBC - BMP8+EGFR - Hepatic function panel - NMR, lipoprofile  Patient Instructions                       Medicare Annual Wellness Visit  [facility] and the medical providers at [facility] strive to bring you the best medical care.  In doing so we not only want to address your current medical conditions and concerns but also to detect new conditions early and prevent illness, disease and health-related problems.    Medicare offers a yearly Wellness Visit which allows our clinical staff to assess your need for preventative services including immunizations, lifestyle education, counseling to decrease risk of preventable diseases and screening for fall risk and other medical concerns.    This visit is provided free of charge (no copay) for all Medicare recipients. The clinical pharmacists at [facility] have begun to conduct these Wellness Visits which will also include a thorough review of all your medications.    As you primary medical provider recommend that you make an appointment for your Annual Wellness Visit if you have not done so already this year.  You may set up this appointment before  you leave today or you may call back [phone]) and schedule an appointment.  Please make sure when you call that you mention that you are scheduling your Annual Wellness Visit with the clinical pharmacist so that the appointment may be made for the proper length of time.     Continue current medications. Continue good therapeutic lifestyle changes which include good diet and exercise. Fall precautions discussed with patient. If an FOBT was given today- please return it to our front desk. If you are over 65 years old - you may need Prevnar 71 or the adult Pneumonia vaccine.  Flu Shots are still available at our office. If you still haven't had one please call to set up a nurse visit to get one.   After your visit with [place] today you will receive a survey in the mail or online from [employer] regarding your care with [place]. Please take a moment to fill this out. Your feedback is very important to [place] as you can help [place] better understand your patient needs as well as improve your experience and satisfaction. WE  CARE ABOUT YOU!!!   The patient should continue to follow-up with his ophthalmologist and his endocrinologist He should follow good dietary habits and watch his sodium intake as closely as possible   Arrie Senate MD

## 2014-10-26 NOTE — Patient Instructions (Addendum)
Medicare Annual Wellness Visit  [facility] and the medical providers at [facility] strive to bring you the best medical care.  In doing so we not only want to address your current medical conditions and concerns but also to detect new conditions early and prevent illness, disease and health-related problems.    Medicare offers a yearly Wellness Visit which allows our clinical staff to assess your need for preventative services including immunizations, lifestyle education, counseling to decrease risk of preventable diseases and screening for fall risk and other medical concerns.    This visit is provided free of charge (no copay) for all Medicare recipients. The clinical pharmacists at [facility] have begun to conduct these Wellness Visits which will also include a thorough review of all your medications.    As you primary medical provider recommend that you make an appointment for your Annual Wellness Visit if you have not done so already this year.  You may set up this appointment before you leave today or you may call back ([phone]) and schedule an appointment.  Please make sure when you call that you mention that you are scheduling your Annual Wellness Visit with the clinical pharmacist so that the appointment may be made for the proper length of time.     Continue current medications. Continue good therapeutic lifestyle changes which include good diet and exercise. Fall precautions discussed with patient. If an FOBT was given today- please return it to our front desk. If you are over 39 years old - you may need Prevnar 33 or the adult Pneumonia vaccine.  Flu Shots are still available at our office. If you still haven't had one please call to set up a nurse visit to get one.   After your visit with [place] today you will receive a survey in the mail or online from [employer] regarding your care with [place]. Please take a moment to  fill this out. Your feedback is very important to [place] as you can help [place] better understand your patient needs as well as improve your experience and satisfaction. WE CARE ABOUT YOU!!!   The patient should continue to follow-up with his ophthalmologist and his endocrinologist He should follow good dietary habits and watch his sodium intake as closely as possible

## 2014-10-27 LAB — HEPATIC FUNCTION PANEL
ALK PHOS: 110 IU/L (ref 39–117)
ALT: 11 IU/L (ref 0–44)
AST: 15 IU/L (ref 0–40)
Albumin: 4.4 g/dL (ref 3.5–4.8)
BILIRUBIN, DIRECT: 0.12 mg/dL (ref 0.00–0.40)
Bilirubin Total: 0.5 mg/dL (ref 0.0–1.2)
Total Protein: 6.4 g/dL (ref 6.0–8.5)

## 2014-10-27 LAB — NMR, LIPOPROFILE
CHOLESTEROL: 246 mg/dL — AB (ref 100–199)
HDL Cholesterol by NMR: 45 mg/dL (ref >39)
HDL Particle Number: 28 umol/L — ABNORMAL LOW (ref >=30.5)
LDL Particle Number: 2115 nmol/L — ABNORMAL HIGH (ref <1000)
LDL SIZE: 20.6 nm (ref >20.5)
LDL-C: 173 mg/dL — AB (ref 0–99)
LP-IR SCORE: 62 — AB (ref <=45)
SMALL LDL PARTICLE NUMBER: 784 nmol/L — AB (ref <=527)
Triglycerides by NMR: 140 mg/dL (ref 0–149)

## 2014-10-27 LAB — BMP8+EGFR
BUN/Creatinine Ratio: 18 (ref 10–22)
BUN: 17 mg/dL (ref 8–27)
CHLORIDE: 100 mmol/L (ref 97–108)
CO2: 26 mmol/L (ref 18–29)
Calcium: 9.4 mg/dL (ref 8.6–10.2)
Creatinine, Ser: 0.94 mg/dL (ref 0.76–1.27)
GFR calc non Af Amer: 79 mL/min/{1.73_m2} (ref >59)
GFR, EST AFRICAN AMERICAN: 91 mL/min/{1.73_m2} (ref >59)
Glucose: 82 mg/dL (ref 65–99)
Potassium: 4.1 mmol/L (ref 3.5–5.2)
Sodium: 140 mmol/L (ref 134–144)

## 2014-10-27 LAB — VITAMIN D 25 HYDROXY (VIT D DEFICIENCY, FRACTURES): VIT D 25 HYDROXY: 27.2 ng/mL — AB (ref 30.0–100.0)

## 2014-11-20 ENCOUNTER — Other Ambulatory Visit: Payer: Self-pay | Admitting: Family Medicine

## 2014-11-21 ENCOUNTER — Other Ambulatory Visit: Payer: Self-pay

## 2014-11-21 MED ORDER — VITAMIN D (ERGOCALCIFEROL) 1.25 MG (50000 UNIT) PO CAPS: ORAL_CAPSULE | ORAL | Status: DC

## 2014-11-30 DIAGNOSIS — M65332 Trigger finger, left middle finger: Secondary | ICD-10-CM | POA: Diagnosis not present

## 2014-12-08 DIAGNOSIS — H5203 Hypermetropia, bilateral: Secondary | ICD-10-CM | POA: Diagnosis not present

## 2014-12-23 ENCOUNTER — Other Ambulatory Visit: Payer: Self-pay | Admitting: Family Medicine

## 2015-01-11 DIAGNOSIS — M65332 Trigger finger, left middle finger: Secondary | ICD-10-CM | POA: Diagnosis not present

## 2015-01-18 ENCOUNTER — Other Ambulatory Visit: Payer: Self-pay | Admitting: Family Medicine

## 2015-01-23 DIAGNOSIS — H11823 Conjunctivochalasis, bilateral: Secondary | ICD-10-CM | POA: Diagnosis not present

## 2015-01-23 DIAGNOSIS — H43812 Vitreous degeneration, left eye: Secondary | ICD-10-CM | POA: Diagnosis not present

## 2015-01-23 DIAGNOSIS — H04123 Dry eye syndrome of bilateral lacrimal glands: Secondary | ICD-10-CM | POA: Diagnosis not present

## 2015-01-23 DIAGNOSIS — H2513 Age-related nuclear cataract, bilateral: Secondary | ICD-10-CM | POA: Diagnosis not present

## 2015-01-23 DIAGNOSIS — H47233 Glaucomatous optic atrophy, bilateral: Secondary | ICD-10-CM | POA: Diagnosis not present

## 2015-01-23 DIAGNOSIS — E0500 Thyrotoxicosis with diffuse goiter without thyrotoxic crisis or storm: Secondary | ICD-10-CM | POA: Diagnosis not present

## 2015-01-24 DIAGNOSIS — Z08 Encounter for follow-up examination after completed treatment for malignant neoplasm: Secondary | ICD-10-CM | POA: Diagnosis not present

## 2015-01-24 DIAGNOSIS — L814 Other melanin hyperpigmentation: Secondary | ICD-10-CM | POA: Diagnosis not present

## 2015-01-24 DIAGNOSIS — L570 Actinic keratosis: Secondary | ICD-10-CM | POA: Diagnosis not present

## 2015-01-24 DIAGNOSIS — D225 Melanocytic nevi of trunk: Secondary | ICD-10-CM | POA: Diagnosis not present

## 2015-01-24 DIAGNOSIS — Z85820 Personal history of malignant melanoma of skin: Secondary | ICD-10-CM | POA: Diagnosis not present

## 2015-03-27 DIAGNOSIS — E0500 Thyrotoxicosis with diffuse goiter without thyrotoxic crisis or storm: Secondary | ICD-10-CM | POA: Diagnosis not present

## 2015-04-23 DIAGNOSIS — M1711 Unilateral primary osteoarthritis, right knee: Secondary | ICD-10-CM | POA: Diagnosis not present

## 2015-05-16 ENCOUNTER — Ambulatory Visit (INDEPENDENT_AMBULATORY_CARE_PROVIDER_SITE_OTHER): Payer: Medicare Other

## 2015-05-16 DIAGNOSIS — Z23 Encounter for immunization: Secondary | ICD-10-CM

## 2015-05-25 DIAGNOSIS — M65341 Trigger finger, right ring finger: Secondary | ICD-10-CM | POA: Diagnosis not present

## 2015-05-25 DIAGNOSIS — M65332 Trigger finger, left middle finger: Secondary | ICD-10-CM | POA: Diagnosis not present

## 2015-05-28 DIAGNOSIS — H47233 Glaucomatous optic atrophy, bilateral: Secondary | ICD-10-CM | POA: Diagnosis not present

## 2015-05-28 DIAGNOSIS — H2513 Age-related nuclear cataract, bilateral: Secondary | ICD-10-CM | POA: Diagnosis not present

## 2015-05-28 DIAGNOSIS — H04123 Dry eye syndrome of bilateral lacrimal glands: Secondary | ICD-10-CM | POA: Diagnosis not present

## 2015-05-28 DIAGNOSIS — H04129 Dry eye syndrome of unspecified lacrimal gland: Secondary | ICD-10-CM | POA: Diagnosis not present

## 2015-05-28 DIAGNOSIS — E0500 Thyrotoxicosis with diffuse goiter without thyrotoxic crisis or storm: Secondary | ICD-10-CM | POA: Diagnosis not present

## 2015-05-28 DIAGNOSIS — F17220 Nicotine dependence, chewing tobacco, uncomplicated: Secondary | ICD-10-CM | POA: Diagnosis not present

## 2015-05-28 DIAGNOSIS — H43812 Vitreous degeneration, left eye: Secondary | ICD-10-CM | POA: Diagnosis not present

## 2015-05-28 DIAGNOSIS — H11823 Conjunctivochalasis, bilateral: Secondary | ICD-10-CM | POA: Diagnosis not present

## 2015-06-01 ENCOUNTER — Other Ambulatory Visit: Payer: Self-pay | Admitting: Family Medicine

## 2015-07-03 ENCOUNTER — Other Ambulatory Visit: Payer: Self-pay | Admitting: Family Medicine

## 2015-07-25 DIAGNOSIS — Z85820 Personal history of malignant melanoma of skin: Secondary | ICD-10-CM | POA: Diagnosis not present

## 2015-07-25 DIAGNOSIS — L570 Actinic keratosis: Secondary | ICD-10-CM | POA: Diagnosis not present

## 2015-07-25 DIAGNOSIS — L821 Other seborrheic keratosis: Secondary | ICD-10-CM | POA: Diagnosis not present

## 2015-07-26 ENCOUNTER — Other Ambulatory Visit: Payer: Self-pay | Admitting: Family Medicine

## 2015-07-26 NOTE — Telephone Encounter (Signed)
Last seen and last Vit D 10/26/14  DWM   27.2

## 2015-08-07 DIAGNOSIS — E0500 Thyrotoxicosis with diffuse goiter without thyrotoxic crisis or storm: Secondary | ICD-10-CM | POA: Diagnosis not present

## 2015-08-08 DIAGNOSIS — M1711 Unilateral primary osteoarthritis, right knee: Secondary | ICD-10-CM | POA: Diagnosis not present

## 2015-09-10 ENCOUNTER — Other Ambulatory Visit: Payer: Self-pay | Admitting: Family Medicine

## 2015-09-25 DIAGNOSIS — H6123 Impacted cerumen, bilateral: Secondary | ICD-10-CM | POA: Diagnosis not present

## 2015-09-27 DIAGNOSIS — Z87891 Personal history of nicotine dependence: Secondary | ICD-10-CM | POA: Diagnosis not present

## 2015-09-27 DIAGNOSIS — E0500 Thyrotoxicosis with diffuse goiter without thyrotoxic crisis or storm: Secondary | ICD-10-CM | POA: Diagnosis not present

## 2015-09-27 DIAGNOSIS — H2513 Age-related nuclear cataract, bilateral: Secondary | ICD-10-CM | POA: Diagnosis not present

## 2015-09-27 DIAGNOSIS — Z79899 Other long term (current) drug therapy: Secondary | ICD-10-CM | POA: Diagnosis not present

## 2015-09-27 DIAGNOSIS — H47393 Other disorders of optic disc, bilateral: Secondary | ICD-10-CM | POA: Diagnosis not present

## 2015-09-27 DIAGNOSIS — H47399 Other disorders of optic disc, unspecified eye: Secondary | ICD-10-CM | POA: Diagnosis not present

## 2015-09-29 ENCOUNTER — Other Ambulatory Visit: Payer: Self-pay | Admitting: Family Medicine

## 2015-10-01 ENCOUNTER — Other Ambulatory Visit: Payer: Self-pay | Admitting: Family Medicine

## 2015-10-01 NOTE — Telephone Encounter (Signed)
Last labs 10/2014

## 2015-10-06 ENCOUNTER — Other Ambulatory Visit: Payer: Self-pay | Admitting: Family Medicine

## 2015-10-12 ENCOUNTER — Encounter: Payer: Self-pay | Admitting: Family Medicine

## 2015-10-12 ENCOUNTER — Ambulatory Visit (INDEPENDENT_AMBULATORY_CARE_PROVIDER_SITE_OTHER): Payer: Medicare Other | Admitting: Family Medicine

## 2015-10-12 VITALS — BP 108/63 | HR 56 | Temp 96.6°F | Ht 66.0 in | Wt 176.0 lb

## 2015-10-12 DIAGNOSIS — E559 Vitamin D deficiency, unspecified: Secondary | ICD-10-CM

## 2015-10-12 DIAGNOSIS — E0590 Thyrotoxicosis, unspecified without thyrotoxic crisis or storm: Secondary | ICD-10-CM | POA: Diagnosis not present

## 2015-10-12 DIAGNOSIS — E0500 Thyrotoxicosis with diffuse goiter without thyrotoxic crisis or storm: Secondary | ICD-10-CM | POA: Diagnosis not present

## 2015-10-12 DIAGNOSIS — E785 Hyperlipidemia, unspecified: Secondary | ICD-10-CM | POA: Diagnosis not present

## 2015-10-12 DIAGNOSIS — Z1211 Encounter for screening for malignant neoplasm of colon: Secondary | ICD-10-CM | POA: Diagnosis not present

## 2015-10-12 DIAGNOSIS — N400 Enlarged prostate without lower urinary tract symptoms: Secondary | ICD-10-CM

## 2015-10-12 LAB — URINALYSIS, COMPLETE
Bilirubin, UA: NEGATIVE
Glucose, UA: NEGATIVE
KETONES UA: NEGATIVE
Leukocytes, UA: NEGATIVE
NITRITE UA: NEGATIVE
RBC, UA: NEGATIVE
Specific Gravity, UA: 1.02 (ref 1.005–1.030)
Urobilinogen, Ur: 0.2 mg/dL (ref 0.2–1.0)
pH, UA: 6.5 (ref 5.0–7.5)

## 2015-10-12 LAB — MICROSCOPIC EXAMINATION
BACTERIA UA: NONE SEEN
EPITHELIAL CELLS (NON RENAL): NONE SEEN /HPF (ref 0–10)
RBC MICROSCOPIC, UA: NONE SEEN /HPF (ref 0–?)
WBC UA: NONE SEEN /HPF (ref 0–?)

## 2015-10-12 NOTE — Progress Notes (Signed)
Subjective:    Patient ID: Jacob Conner, male    DOB: Mar 01, 1941, 75 y.o.   MRN: [id number]  HPI Pt here for follow up and management of chronic medical problems which includes hypothyroid and hyperlipidemia. He is taking medications regularly.The patient continues to follow-up with his ophthalmologist and endocrinologist at [facility] because of the Graves' disease and its effect on his eyes. He sees the endocrinologist every 6 months and sees the ophthalmologist yearly. He is feeling well. He denies any chest pain or shortness of breath. He denies any problems with swallowing heartburn indigestion nausea vomiting diarrhea or blood in the stool. He is passing his water without problems just frequently when taking the furosemide. He has not seen any blood in the urine. He stays active at home because he has to take care of his 2 grandsons.     Patient Active Problem List   Diagnosis Date Noted  . Hyperthyroidism 06/08/2013  . BPH (benign prostatic hyperplasia) 04/19/2013  . Hyperlipidemia 04/19/2013  . 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 Spermatocele 04/19/2013  . Hx of colonic polyp 04/19/2013  . History of migraine headaches 04/19/2013   Outpatient Encounter Prescriptions as of 10/12/2015  Medication Sig  . Artificial Tear Ointment (ARTIFICIAL TEARS) ointment Place 1 application into both eyes as needed.  Marland Kitchen aspirin EC 81 MG tablet Take 81 mg by mouth daily.  . finasteride (PROSCAR) 5 MG tablet TAKE (1) TABLET BY MOUTH ONCE DAILY.  . furosemide (LASIX) 20 MG tablet TAKE (1) TABLET BY MOUTH ONCE DAILY.  . methimazole (TAPAZOLE) 5 MG tablet Take 1 tablet by mouth daily.  . Vitamin D, Ergocalciferol, (DRISDOL) 50000 units CAPS capsule TAKE 1 CAPSULE BY MOUTH ONCE A WEEK.   No facility-administered encounter medications on file as of 10/12/2015.      Review of Systems  Constitutional: Negative.   HENT:  Negative.   Eyes: Negative.   Respiratory: Negative.   Cardiovascular: Negative.   Gastrointestinal: Negative.   Endocrine: Negative.   Genitourinary: Negative.   Musculoskeletal: Negative.   Skin: Negative.   Allergic/Immunologic: Negative.   Neurological: Negative.   Hematological: Negative.   Psychiatric/Behavioral: Negative.        Objective:   Physical Exam  Constitutional: He is oriented to person, place, and time. He appears well-developed and well-nourished.  HENT:  Head: Normocephalic and atraumatic.  Right Ear: External ear normal.  Left Ear: External ear normal.  Nose: Nose normal.  Mouth/Throat: Oropharynx is clear and moist. No oropharyngeal exudate.  Eyes: Conjunctivae and EOM are normal. Pupils are equal, round, and reactive to light. Right eye exhibits no discharge. Left eye exhibits no discharge. No scleral icterus.  Neck: Normal range of motion. Neck supple. No thyromegaly present.  No thyromegaly.  Cardiovascular: Normal rate, regular rhythm and intact distal pulses.   No murmur heard. The heart is regular at 72/m  Pulmonary/Chest: Effort normal and breath sounds normal. No respiratory distress. He has no wheezes. He has no rales. He exhibits no tenderness.  Clear anteriorly and posteriorly and no axillary adenopathy  Abdominal: Soft. Bowel sounds are normal. He exhibits no mass. There is no tenderness. There is no rebound and no guarding.  Genitourinary: Rectum normal, prostate normal and penis normal.  The prostate is minimally enlarged if any. There is no lumps or masses the rectal exam was clear of masses external genitalia were within normal limits  Musculoskeletal: Normal range  of motion. He exhibits no edema or tenderness.  Lymphadenopathy:    He has no cervical adenopathy.  Neurological: He is alert and oriented to person, place, and time. He has normal reflexes. No cranial nerve deficit.  Skin: Skin is warm and dry. No rash noted.  Psychiatric: He  has a normal mood and affect. His behavior is normal. Judgment and thought content normal.  Nursing note and vitals reviewed.  BP 108/63 mmHg  Pulse 56  Temp(Src) 96.6 F (35.9 C) (Oral)  Ht 5' 6"  (1.676 m)  Wt 176 lb (79.833 kg)  BMI 28.42 kg/m2        Assessment & Plan:  1. Hyperlipidemia -Continue aggressive therapeutic lifestyle changes - BMP8+EGFR - CBC with Differential/Platelet - Hepatic function panel - NMR, lipoprofile  2. Hyperthyroidism -Continue with follow-up with endocrinology and ophthalmology - CBC with Differential/Platelet - Thyroid Panel With TSH  3. Vitamin D deficiency -Continue current treatment pending results of lab work - CBC with Differential/Platelet - VITAMIN D 25 Hydroxy (Vit-D Deficiency, Fractures)  4. BPH (benign prostatic hyperplasia) -The prostate is minimally enlarged and the patient's frequency seems to be more related to taking the fluid pill and not stay. There were no lumps or masses. - CBC with Differential/Platelet - Urinalysis, Complete - PSA, total and free  5. Special screening for malignant neoplasms, colon - Fecal occult blood, imunochemical; Future - CBC with Differential/Platelet  6. Graves disease -Continue follow-up with endocrinology  7. Ophthalmic Graves disease -Continue follow-up with ophthalmology  Patient Instructions                       Medicare Annual Wellness Visit  [facility] and the medical providers at [facility] strive to bring you the best medical care.  In doing so we not only want to address your current medical conditions and concerns but also to detect new conditions early and prevent illness, disease and health-related problems.    Medicare offers a yearly Wellness Visit which allows our clinical staff to assess your need for preventative services including immunizations, lifestyle education, counseling to decrease risk of preventable diseases and screening for fall  risk and other medical concerns.    This visit is provided free of charge (no copay) for all Medicare recipients. The clinical pharmacists at [facility] have begun to conduct these Wellness Visits which will also include a thorough review of all your medications.    As you primary medical provider recommend that you make an appointment for your Annual Wellness Visit if you have not done so already this year.  You may set up this appointment before you leave today or you may call back ([phone]) and schedule an appointment.  Please make sure when you call that you mention that you are scheduling your Annual Wellness Visit with the clinical pharmacist so that the appointment may be made for the proper length of time.     Continue current medications. Continue good therapeutic lifestyle changes which include good diet and exercise. Fall precautions discussed with patient. If an FOBT was given today- please return it to our front desk. If you are over 56 years old - you may need Prevnar 92 or the adult Pneumonia vaccine.  **Flu shots are available--- please call and schedule a FLU-CLINIC appointment**  After your visit with [place] today you will receive a survey in the mail or online from [employer] regarding your care with [place]. Please take a  moment to fill this out. Your feedback is very important to [place] as you can help [place] better understand your patient needs as well as improve your experience and satisfaction. WE CARE ABOUT YOU!!!   Continue to follow-up with endocrinology and ophthalmology Stay active and drink plenty of fluids and watch sugar intake as closely as possible   Arrie Senate MD

## 2015-10-12 NOTE — Patient Instructions (Addendum)
Medicare Annual Wellness Visit  [facility] and the medical providers at [facility] strive to bring you the best medical care.  In doing so we not only want to address your current medical conditions and concerns but also to detect new conditions early and prevent illness, disease and health-related problems.    Medicare offers a yearly Wellness Visit which allows our clinical staff to assess your need for preventative services including immunizations, lifestyle education, counseling to decrease risk of preventable diseases and screening for fall risk and other medical concerns.    This visit is provided free of charge (no copay) for all Medicare recipients. The clinical pharmacists at [facility] have begun to conduct these Wellness Visits which will also include a thorough review of all your medications.    As you primary medical provider recommend that you make an appointment for your Annual Wellness Visit if you have not done so already this year.  You may set up this appointment before you leave today or you may call back [phone] and schedule an appointment.  Please make sure when you call that you mention that you are scheduling your Annual Wellness Visit with the clinical pharmacist so that the appointment may be made for the proper length of time.     Continue current medications. Continue good therapeutic lifestyle changes which include good diet and exercise. Fall precautions discussed with patient. If an FOBT was given today- please return it to our front desk. If you are over 77 years old - you may need Prevnar 70 or the adult Pneumonia vaccine.  **Flu shots are available--- please call and schedule a FLU-CLINIC appointment**  After your visit with [place] today you will receive a survey in the mail or online from [employer] regarding your care with [place]. Please take a moment to fill this out. Your feedback is very  important to [place] as you can help [place] better understand your patient needs as well as improve your experience and satisfaction. WE CARE ABOUT YOU!!!   Continue to follow-up with endocrinology and ophthalmology Stay active and drink plenty of fluids and watch sugar intake as closely as possible

## 2015-10-13 LAB — VITAMIN D 25 HYDROXY (VIT D DEFICIENCY, FRACTURES): Vit D, 25-Hydroxy: 35.4 ng/mL (ref 30.0–100.0)

## 2015-10-13 LAB — NMR, LIPOPROFILE
Cholesterol: 253 mg/dL — ABNORMAL HIGH (ref 100–199)
HDL CHOLESTEROL BY NMR: 38 mg/dL — AB (ref >39)
HDL Particle Number: 28.4 umol/L — ABNORMAL LOW (ref >=30.5)
LDL Particle Number: 2436 nmol/L — ABNORMAL HIGH (ref <1000)
LDL Size: 20.4 nm (ref >20.5)
LDL-C: 187 mg/dL — ABNORMAL HIGH (ref 0–99)
LP-IR Score: 62 — ABNORMAL HIGH (ref <=45)
SMALL LDL PARTICLE NUMBER: 1092 nmol/L — AB (ref <=527)
TRIGLYCERIDES BY NMR: 139 mg/dL (ref 0–149)

## 2015-10-13 LAB — CBC WITH DIFFERENTIAL/PLATELET
BASOS ABS: 0 10*3/uL (ref 0.0–0.2)
Basos: 0 %
EOS (ABSOLUTE): 0.1 10*3/uL (ref 0.0–0.4)
Eos: 2 %
Hematocrit: 45.3 % (ref 37.5–51.0)
Hemoglobin: 15.2 g/dL (ref 12.6–17.7)
Immature Grans (Abs): 0 10*3/uL (ref 0.0–0.1)
Immature Granulocytes: 0 %
LYMPHS ABS: 1.8 10*3/uL (ref 0.7–3.1)
LYMPHS: 31 %
MCH: 29.8 pg (ref 26.6–33.0)
MCHC: 33.6 g/dL (ref 31.5–35.7)
MCV: 89 fL (ref 79–97)
MONOCYTES: 8 %
Monocytes Absolute: 0.5 10*3/uL (ref 0.1–0.9)
NEUTROS ABS: 3.3 10*3/uL (ref 1.4–7.0)
Neutrophils: 59 %
PLATELETS: 313 10*3/uL (ref 150–379)
RBC: 5.1 x10E6/uL (ref 4.14–5.80)
RDW: 13.8 % (ref 12.3–15.4)
WBC: 5.6 10*3/uL (ref 3.4–10.8)

## 2015-10-13 LAB — BMP8+EGFR
BUN/Creatinine Ratio: 17 (ref 10–22)
BUN: 17 mg/dL (ref 8–27)
CALCIUM: 9.5 mg/dL (ref 8.6–10.2)
CHLORIDE: 99 mmol/L (ref 96–106)
CO2: 27 mmol/L (ref 18–29)
CREATININE: 1.03 mg/dL (ref 0.76–1.27)
GFR, EST AFRICAN AMERICAN: 81 mL/min/{1.73_m2} (ref >59)
GFR, EST NON AFRICAN AMERICAN: 70 mL/min/{1.73_m2} (ref >59)
GLUCOSE: 85 mg/dL (ref 65–99)
POTASSIUM: 4.1 mmol/L (ref 3.5–5.2)
Sodium: 142 mmol/L (ref 134–144)

## 2015-10-13 LAB — THYROID PANEL WITH TSH
FREE THYROXINE INDEX: 1.9 (ref 1.2–4.9)
T3 UPTAKE RATIO: 24 % (ref 24–39)
T4, Total: 7.9 ug/dL (ref 4.5–12.0)
TSH: 2.44 u[IU]/mL (ref 0.450–4.500)

## 2015-10-13 LAB — HEPATIC FUNCTION PANEL
ALBUMIN: 4.5 g/dL (ref 3.5–4.8)
ALT: 12 IU/L (ref 0–44)
AST: 16 IU/L (ref 0–40)
Alkaline Phosphatase: 112 IU/L (ref 39–117)
Bilirubin Total: 0.5 mg/dL (ref 0.0–1.2)
Bilirubin, Direct: 0.12 mg/dL (ref 0.00–0.40)
TOTAL PROTEIN: 6.9 g/dL (ref 6.0–8.5)

## 2015-10-13 LAB — PSA, TOTAL AND FREE
PROSTATE SPECIFIC AG, SERUM: 1.1 ng/mL (ref 0.0–4.0)
PSA, Free Pct: 25.5 %
PSA, Free: 0.28 ng/mL

## 2015-10-16 ENCOUNTER — Telehealth: Payer: Self-pay | Admitting: Family Medicine

## 2015-10-16 MED ORDER — FINASTERIDE 5 MG PO TABS: ORAL_TABLET | ORAL | Status: DC

## 2015-10-16 NOTE — Telephone Encounter (Signed)
Pt aware.

## 2015-11-23 ENCOUNTER — Encounter (INDEPENDENT_AMBULATORY_CARE_PROVIDER_SITE_OTHER): Payer: Self-pay

## 2015-12-08 ENCOUNTER — Other Ambulatory Visit: Payer: Self-pay | Admitting: Family Medicine

## 2015-12-10 NOTE — Telephone Encounter (Signed)
Last seen 10/12/15 DWM  Last Vit D 10/12/15  35.4

## 2015-12-11 DIAGNOSIS — E0500 Thyrotoxicosis with diffuse goiter without thyrotoxic crisis or storm: Secondary | ICD-10-CM | POA: Diagnosis not present

## 2016-01-04 DIAGNOSIS — M65341 Trigger finger, right ring finger: Secondary | ICD-10-CM | POA: Diagnosis not present

## 2016-01-04 DIAGNOSIS — M65332 Trigger finger, left middle finger: Secondary | ICD-10-CM | POA: Diagnosis not present

## 2016-01-24 DIAGNOSIS — L821 Other seborrheic keratosis: Secondary | ICD-10-CM | POA: Diagnosis not present

## 2016-01-24 DIAGNOSIS — D1801 Hemangioma of skin and subcutaneous tissue: Secondary | ICD-10-CM | POA: Diagnosis not present

## 2016-01-24 DIAGNOSIS — L570 Actinic keratosis: Secondary | ICD-10-CM | POA: Diagnosis not present

## 2016-01-24 DIAGNOSIS — D235 Other benign neoplasm of skin of trunk: Secondary | ICD-10-CM | POA: Diagnosis not present

## 2016-01-24 DIAGNOSIS — L814 Other melanin hyperpigmentation: Secondary | ICD-10-CM | POA: Diagnosis not present

## 2016-01-24 DIAGNOSIS — Z85820 Personal history of malignant melanoma of skin: Secondary | ICD-10-CM | POA: Diagnosis not present

## 2016-03-14 ENCOUNTER — Other Ambulatory Visit: Payer: Self-pay | Admitting: Family Medicine

## 2016-04-07 DIAGNOSIS — Z87891 Personal history of nicotine dependence: Secondary | ICD-10-CM | POA: Diagnosis not present

## 2016-04-07 DIAGNOSIS — H11423 Conjunctival edema, bilateral: Secondary | ICD-10-CM | POA: Diagnosis not present

## 2016-04-07 DIAGNOSIS — E0500 Thyrotoxicosis with diffuse goiter without thyrotoxic crisis or storm: Secondary | ICD-10-CM | POA: Diagnosis not present

## 2016-04-07 DIAGNOSIS — H2513 Age-related nuclear cataract, bilateral: Secondary | ICD-10-CM | POA: Diagnosis not present

## 2016-04-07 DIAGNOSIS — H47239 Glaucomatous optic atrophy, unspecified eye: Secondary | ICD-10-CM | POA: Diagnosis not present

## 2016-04-07 DIAGNOSIS — H11823 Conjunctivochalasis, bilateral: Secondary | ICD-10-CM | POA: Diagnosis not present

## 2016-04-14 ENCOUNTER — Encounter: Payer: Self-pay | Admitting: Family Medicine

## 2016-04-14 ENCOUNTER — Ambulatory Visit (INDEPENDENT_AMBULATORY_CARE_PROVIDER_SITE_OTHER): Payer: Medicare Other | Admitting: Otolaryngology

## 2016-04-14 ENCOUNTER — Ambulatory Visit (INDEPENDENT_AMBULATORY_CARE_PROVIDER_SITE_OTHER): Payer: Medicare Other | Admitting: Family Medicine

## 2016-04-14 VITALS — BP 126/73 | HR 59 | Temp 96.9°F | Ht 66.0 in | Wt 182.0 lb

## 2016-04-14 DIAGNOSIS — E559 Vitamin D deficiency, unspecified: Secondary | ICD-10-CM

## 2016-04-14 DIAGNOSIS — H60332 Swimmer's ear, left ear: Secondary | ICD-10-CM | POA: Diagnosis not present

## 2016-04-14 DIAGNOSIS — E0500 Thyrotoxicosis with diffuse goiter without thyrotoxic crisis or storm: Secondary | ICD-10-CM

## 2016-04-14 DIAGNOSIS — Z800 Family history of malignant neoplasm of digestive organs: Secondary | ICD-10-CM

## 2016-04-14 DIAGNOSIS — Z23 Encounter for immunization: Secondary | ICD-10-CM

## 2016-04-14 DIAGNOSIS — I700 Atherosclerosis of aorta: Secondary | ICD-10-CM

## 2016-04-14 DIAGNOSIS — E785 Hyperlipidemia, unspecified: Secondary | ICD-10-CM | POA: Diagnosis not present

## 2016-04-14 DIAGNOSIS — E0590 Thyrotoxicosis, unspecified without thyrotoxic crisis or storm: Secondary | ICD-10-CM

## 2016-04-14 DIAGNOSIS — N400 Enlarged prostate without lower urinary tract symptoms: Secondary | ICD-10-CM | POA: Diagnosis not present

## 2016-04-14 NOTE — Progress Notes (Signed)
Subjective:    Patient ID: Jacob Conner, male    DOB: 04/06/1941, 75 y.o.   MRN: [id number]  HPI Pt here for follow up and management of chronic medical problems which includes hypothyroid and hyperlipidemia. He is taking medications regularly.The patient is doing well overall. He does complain of his left ear being stopped up. He'll get lab work today. We will make sure that he takes a copy of this lab with him to see his other physicians regarding especially his hyper thyroidism or Graves' disease. The patient denies any chest pain or shortness of breath anymore than usual. He denies any heartburn indigestion nausea or vomiting. He does have very soft bowel movements and complains of keeping himself clean. He does go frequently to pass his water because of taking a fluid pill this seems to settle down by the nighttime. He does complain of right knee pain especially with going down hills. He does not have any circulation issues with his legs like his father had according to him. In other words he denies any claudication symptoms.     Patient Active Problem List   Diagnosis Date Noted  . Graves disease 10/12/2015  . 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 Hyperthyroidism 06/08/2013  . BPH (benign prostatic hyperplasia) 04/19/2013  . Hyperlipidemia 04/19/2013  . 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 Spermatocele 04/19/2013  . Hx of colonic polyp 04/19/2013  . History of migraine headaches 04/19/2013   Outpatient Encounter Prescriptions as of 04/14/2016  Medication Sig  . Artificial Tear Ointment (ARTIFICIAL TEARS) ointment Place 1 application into both eyes as needed.  Marland Kitchen aspirin EC 81 MG tablet Take 81 mg by mouth daily.  . finasteride (PROSCAR) 5 MG tablet TAKE (1) TABLET BY MOUTH ONCE DAILY.  . furosemide (LASIX) 20 MG tablet TAKE (1) TABLET BY MOUTH ONCE DAILY.  . methimazole (TAPAZOLE) 5 MG tablet Take 1 tablet by mouth daily.  . Vitamin D,  Ergocalciferol, (DRISDOL) 50000 units CAPS capsule TAKE 1 CAPSULE BY MOUTH ONCE A WEEK.   No facility-administered encounter medications on file as of 04/14/2016.       Review of Systems  Constitutional: Negative.   HENT: Positive for ear pain. Facial swelling: left ear issues - ENT appt today.   Eyes: Negative.   Respiratory: Negative.   Cardiovascular: Negative.   Gastrointestinal: Negative.   Endocrine: Negative.   Genitourinary: Negative.   Musculoskeletal: Negative.   Skin: Negative.   Allergic/Immunologic: Negative.   Neurological: Negative.   Hematological: Negative.   Psychiatric/Behavioral: Negative.        Objective:   Physical Exam  Constitutional: He is oriented to person, place, and time. He appears well-developed and well-nourished. No distress.  Pleasant and alert  HENT:  Head: Normocephalic and atraumatic.  Right Ear: External ear normal.  Nose: Nose normal.  Mouth/Throat: Oropharynx is clear and moist. No oropharyngeal exudate.  There is swelling and inflammation in the left ear canal. No wax was visible the eardrum was not visible. The right ear canal was clear.  Eyes: Conjunctivae and EOM are normal. Pupils are equal, round, and reactive to light. Right eye exhibits no discharge. Left eye exhibits no discharge. No scleral icterus.  Neck: Normal range of motion. Neck supple. No thyromegaly present.  No bruits thyromegaly or anterior cervical adenopathy  Cardiovascular: Normal rate, regular rhythm and intact distal pulses.   No murmur heard. Heart is regular at 60/m  Pulmonary/Chest: Effort normal and  breath sounds normal. No respiratory distress. He has no wheezes. He has no rales. He exhibits no tenderness.  Clear anteriorly and posteriorly with no axillary adenopathy  Abdominal: Soft. Bowel sounds are normal. He exhibits no mass. There is no tenderness. There is no rebound and no guarding.  No abdominal tenderness masses or organ enlargement. No bruits.    Musculoskeletal: Normal range of motion. He exhibits no edema.  Lymphadenopathy:    He has no cervical adenopathy.  Neurological: He is alert and oriented to person, place, and time.  Skin: Skin is warm and dry. No rash noted. No erythema.  Psychiatric: He has a normal mood and affect. His behavior is normal. Judgment and thought content normal.  Nursing note and vitals reviewed.  BP 126/73 (BP Location: Right Arm)   Pulse (!) 59   Temp (!) 96.9 F (36.1 C) (Oral)   Ht 5' 6"  (1.676 m)   Wt 182 lb (82.6 kg)   BMI 29.38 kg/m         Assessment & Plan:  1. Hyperlipidemia -Continue aggressive therapeutic lifestyle changes pending results of lab work - BMP8+EGFR - CBC with Differential/Platelet - NMR, lipoprofile - Hepatic function panel  2. Hyperthyroidism -Continue follow-up with endocrinology and ophthalmology and make sure that he takes a copy of this blood work with him to his next visit - CBC with Differential/Platelet - Thyroid Panel With TSH  3. Vitamin D deficiency -Continue current treatment pending results of lab work - CBC with Differential/Platelet - VITAMIN D 25 Hydroxy (Vit-D Deficiency, Fractures)  4. BPH (benign prostatic hyperplasia) -The patient has urinary frequency but mostly secondary to taking his fluid pill. - CBC with Differential/Platelet  5. Aortic arch atherosclerosis ([place]) -Continue aggressive therapeutic lifestyle changes  6. Graves disease -Continue follow-up with endocrinology  7. Ophthalmic Graves disease -Continue follow-up with ophthalmology  8. Primary osteoarthritis of right knee -If problems continue consider injection by orthopedist  9. Family history colon cancer? -The patient's father had a colostomy. -We will discuss this with the gastroenterologist especially to see if the patient needs a colonoscopy sooner than originally planned.  10. Cellulitis left ear canal -The ear nose and throat as planned  Patient  Instructions                       Medicare Annual Wellness Visit  [facility] and the medical providers at [facility] strive to bring you the best medical care.  In doing so we not only want to address your current medical conditions and concerns but also to detect new conditions early and prevent illness, disease and health-related problems.    Medicare offers a yearly Wellness Visit which allows our clinical staff to assess your need for preventative services including immunizations, lifestyle education, counseling to decrease risk of preventable diseases and screening for fall risk and other medical concerns.    This visit is provided free of charge (no copay) for all Medicare recipients. The clinical pharmacists at [facility] have begun to conduct these Wellness Visits which will also include a thorough review of all your medications.    As you primary medical provider recommend that you make an appointment for your Annual Wellness Visit if you have not done so already this year.  You may set up this appointment before you leave today or you may call back [phone]) and schedule an appointment.  Please make sure when you call that you mention that  you are scheduling your Annual Wellness Visit with the clinical pharmacist so that the appointment may be made for the proper length of time.     Continue current medications. Continue good therapeutic lifestyle changes which include good diet and exercise. Fall precautions discussed with patient. If an FOBT was given today- please return it to our front desk. If you are over 76 years old - you may need Prevnar 86 or the adult Pneumonia vaccine.  **Flu shots are available--- please call and schedule a FLU-CLINIC appointment**  After your visit with [place] today you will receive a survey in the mail or online from [employer] regarding your care with [place]. Please take a moment to fill this out. Your feedback  is very important to [place] as you can help [place] better understand your patient needs as well as improve your experience and satisfaction. WE CARE ABOUT YOU!!!   Flu shot that you received today may make your arm sore Continue to follow-up with ophthalmology and endocrinology See ear nose and throat as planned We will follow-up with the gastroenterologist about your need for your next colonoscopy especially with your dad's history. Follow-up with ear nose and throat as planned    Arrie Senate MD

## 2016-04-14 NOTE — Patient Instructions (Addendum)
Medicare Annual Wellness Visit  [facility] and the medical providers at [facility] strive to bring you the best medical care.  In doing so we not only want to address your current medical conditions and concerns but also to detect new conditions early and prevent illness, disease and health-related problems.    Medicare offers a yearly Wellness Visit which allows our clinical staff to assess your need for preventative services including immunizations, lifestyle education, counseling to decrease risk of preventable diseases and screening for fall risk and other medical concerns.    This visit is provided free of charge (no copay) for all Medicare recipients. The clinical pharmacists at [facility] have begun to conduct these Wellness Visits which will also include a thorough review of all your medications.    As you primary medical provider recommend that you make an appointment for your Annual Wellness Visit if you have not done so already this year.  You may set up this appointment before you leave today or you may call back [phone] and schedule an appointment.  Please make sure when you call that you mention that you are scheduling your Annual Wellness Visit with the clinical pharmacist so that the appointment may be made for the proper length of time.     Continue current medications. Continue good therapeutic lifestyle changes which include good diet and exercise. Fall precautions discussed with patient. If an FOBT was given today- please return it to our front desk. If you are over 77 years old - you may need Prevnar 50 or the adult Pneumonia vaccine.  **Flu shots are available--- please call and schedule a FLU-CLINIC appointment**  After your visit with [place] today you will receive a survey in the mail or online from [employer] regarding your care with [place]. Please take a moment to fill this out. Your feedback is very  important to [place] as you can help [place] better understand your patient needs as well as improve your experience and satisfaction. WE CARE ABOUT YOU!!!   Flu shot that you received today may make your arm sore Continue to follow-up with ophthalmology and endocrinology See ear nose and throat as planned We will follow-up with the gastroenterologist about your need for your next colonoscopy especially with your dad's history. Follow-up with ear nose and throat as planned

## 2016-04-15 ENCOUNTER — Telehealth: Payer: Self-pay | Admitting: *Deleted

## 2016-04-15 LAB — CBC WITH DIFFERENTIAL/PLATELET
BASOS: 0 %
Basophils Absolute: 0 10*3/uL (ref 0.0–0.2)
EOS (ABSOLUTE): 0.2 10*3/uL (ref 0.0–0.4)
EOS: 3 %
HEMATOCRIT: 43.9 % (ref 37.5–51.0)
HEMOGLOBIN: 15.4 g/dL (ref 12.6–17.7)
IMMATURE GRANS (ABS): 0 10*3/uL (ref 0.0–0.1)
IMMATURE GRANULOCYTES: 0 %
LYMPHS: 27 %
Lymphocytes Absolute: 1.9 10*3/uL (ref 0.7–3.1)
MCH: 30.5 pg (ref 26.6–33.0)
MCHC: 35.1 g/dL (ref 31.5–35.7)
MCV: 87 fL (ref 79–97)
MONOCYTES: 8 %
MONOS ABS: 0.6 10*3/uL (ref 0.1–0.9)
NEUTROS PCT: 62 %
Neutrophils Absolute: 4.4 10*3/uL (ref 1.4–7.0)
Platelets: 279 10*3/uL (ref 150–379)
RBC: 5.05 x10E6/uL (ref 4.14–5.80)
RDW: 13.6 % (ref 12.3–15.4)
WBC: 7.1 10*3/uL (ref 3.4–10.8)

## 2016-04-15 LAB — BMP8+EGFR
BUN / CREAT RATIO: 14 (ref 10–24)
BUN: 14 mg/dL (ref 8–27)
CALCIUM: 9.6 mg/dL (ref 8.6–10.2)
CHLORIDE: 100 mmol/L (ref 96–106)
CO2: 28 mmol/L (ref 18–29)
CREATININE: 1.03 mg/dL (ref 0.76–1.27)
GFR calc non Af Amer: 70 mL/min/{1.73_m2} (ref >59)
GFR, EST AFRICAN AMERICAN: 81 mL/min/{1.73_m2} (ref >59)
Glucose: 99 mg/dL (ref 65–99)
Potassium: 4.4 mmol/L (ref 3.5–5.2)
Sodium: 141 mmol/L (ref 134–144)

## 2016-04-15 LAB — HEPATIC FUNCTION PANEL
ALK PHOS: 113 IU/L (ref 39–117)
ALT: 13 IU/L (ref 0–44)
AST: 18 IU/L (ref 0–40)
Albumin: 4.4 g/dL (ref 3.5–4.8)
BILIRUBIN TOTAL: 0.5 mg/dL (ref 0.0–1.2)
BILIRUBIN, DIRECT: 0.09 mg/dL (ref 0.00–0.40)
Total Protein: 6.6 g/dL (ref 6.0–8.5)

## 2016-04-15 LAB — THYROID PANEL WITH TSH
Free Thyroxine Index: 1.8 (ref 1.2–4.9)
T3 Uptake Ratio: 25 % (ref 24–39)
T4, Total: 7.1 ug/dL (ref 4.5–12.0)
TSH: 2.9 u[IU]/mL (ref 0.450–4.500)

## 2016-04-15 LAB — NMR, LIPOPROFILE
Cholesterol: 269 mg/dL — ABNORMAL HIGH (ref 100–199)
HDL CHOLESTEROL BY NMR: 36 mg/dL — AB (ref >39)
HDL PARTICLE NUMBER: 27.1 umol/L — AB (ref >=30.5)
LDL Particle Number: 2527 nmol/L — ABNORMAL HIGH (ref <1000)
LDL SIZE: 20 nm (ref >20.5)
LDL-C: 192 mg/dL — ABNORMAL HIGH (ref 0–99)
LP-IR Score: 67 — ABNORMAL HIGH (ref <=45)
SMALL LDL PARTICLE NUMBER: 1765 nmol/L — AB (ref <=527)
Triglycerides by NMR: 206 mg/dL — ABNORMAL HIGH (ref 0–149)

## 2016-04-15 LAB — VITAMIN D 25 HYDROXY (VIT D DEFICIENCY, FRACTURES): Vit D, 25-Hydroxy: 34.4 ng/mL (ref 30.0–100.0)

## 2016-04-15 NOTE — Telephone Encounter (Signed)
I have left a message with patient's wife for patient to call back. We have no records from previous colonoscopy, however per [facility] records there is indication that he has history of colon polyps. We will need to obtain last colonoscopy report.

## 2016-04-15 NOTE — Telephone Encounter (Signed)
Patient wife returned phone call ° °

## 2016-04-15 NOTE — Telephone Encounter (Signed)
-----   Message from Jerene Bears, MD sent at 04/14/2016  4:03 PM EDT ----- Contacted by Dr. Tawanna Sat office Pt with fam hx of colon cancer Reportedly been 6 yrs since colon If so, he can be scheduled directly for colon assuming he meets [facility] criteria and is not on blood thinners JMP  ----- Message ----- From: Zannie Cove, LPN Sent: [id number]   9:29 AM To: Jerene Bears, MD  Hey Dr Hilarie Fredrickson,  Dr Redge Gainer wonders if you could review this pt's chart and see if he should have a colon soon. It's been 6 years since his last and he does have some bowel irritation. His father had colon cancer. Thanks for helping.  Georgina Pillion, LPN

## 2016-04-17 NOTE — Telephone Encounter (Signed)
Per patient's wife, patient is already scheduled to see Dr Amedeo Plenty in the office. Dr Amedeo Plenty is who did patient's last procedure. Wife states that she called her and was told that we could not give her any information since she was not on his HIPAA form so they went ahead and rescheduled with Dr Amedeo Plenty.

## 2016-04-29 DIAGNOSIS — H60332 Swimmer's ear, left ear: Secondary | ICD-10-CM | POA: Diagnosis not present

## 2016-04-30 DIAGNOSIS — Z1211 Encounter for screening for malignant neoplasm of colon: Secondary | ICD-10-CM | POA: Diagnosis not present

## 2016-07-18 ENCOUNTER — Other Ambulatory Visit: Payer: Self-pay | Admitting: Family Medicine

## 2016-08-05 DIAGNOSIS — L570 Actinic keratosis: Secondary | ICD-10-CM | POA: Diagnosis not present

## 2016-08-05 DIAGNOSIS — L821 Other seborrheic keratosis: Secondary | ICD-10-CM | POA: Diagnosis not present

## 2016-08-05 DIAGNOSIS — D235 Other benign neoplasm of skin of trunk: Secondary | ICD-10-CM | POA: Diagnosis not present

## 2016-08-05 DIAGNOSIS — L814 Other melanin hyperpigmentation: Secondary | ICD-10-CM | POA: Diagnosis not present

## 2016-08-05 DIAGNOSIS — Z85820 Personal history of malignant melanoma of skin: Secondary | ICD-10-CM | POA: Diagnosis not present

## 2016-09-02 DIAGNOSIS — H6121 Impacted cerumen, right ear: Secondary | ICD-10-CM | POA: Diagnosis not present

## 2016-09-16 DIAGNOSIS — Z79899 Other long term (current) drug therapy: Secondary | ICD-10-CM | POA: Diagnosis not present

## 2016-09-16 DIAGNOSIS — E0500 Thyrotoxicosis with diffuse goiter without thyrotoxic crisis or storm: Secondary | ICD-10-CM | POA: Diagnosis not present

## 2016-10-14 ENCOUNTER — Encounter: Payer: Self-pay | Admitting: Family Medicine

## 2016-10-14 ENCOUNTER — Ambulatory Visit (INDEPENDENT_AMBULATORY_CARE_PROVIDER_SITE_OTHER): Payer: Medicare Other

## 2016-10-14 ENCOUNTER — Ambulatory Visit (INDEPENDENT_AMBULATORY_CARE_PROVIDER_SITE_OTHER): Payer: Medicare Other | Admitting: Family Medicine

## 2016-10-14 ENCOUNTER — Other Ambulatory Visit: Payer: Self-pay | Admitting: Family Medicine

## 2016-10-14 VITALS — BP 129/67 | HR 62 | Temp 96.8°F | Ht 66.0 in | Wt 182.0 lb

## 2016-10-14 DIAGNOSIS — E7800 Pure hypercholesterolemia, unspecified: Secondary | ICD-10-CM

## 2016-10-14 DIAGNOSIS — R0989 Other specified symptoms and signs involving the circulatory and respiratory systems: Secondary | ICD-10-CM | POA: Diagnosis not present

## 2016-10-14 DIAGNOSIS — I700 Atherosclerosis of aorta: Secondary | ICD-10-CM

## 2016-10-14 DIAGNOSIS — I739 Peripheral vascular disease, unspecified: Secondary | ICD-10-CM

## 2016-10-14 DIAGNOSIS — Z0000 Encounter for general adult medical examination without abnormal findings: Secondary | ICD-10-CM

## 2016-10-14 DIAGNOSIS — Z800 Family history of malignant neoplasm of digestive organs: Secondary | ICD-10-CM

## 2016-10-14 DIAGNOSIS — N400 Enlarged prostate without lower urinary tract symptoms: Secondary | ICD-10-CM

## 2016-10-14 DIAGNOSIS — E559 Vitamin D deficiency, unspecified: Secondary | ICD-10-CM

## 2016-10-14 DIAGNOSIS — G43809 Other migraine, not intractable, without status migrainosus: Secondary | ICD-10-CM

## 2016-10-14 DIAGNOSIS — E0500 Thyrotoxicosis with diffuse goiter without thyrotoxic crisis or storm: Secondary | ICD-10-CM

## 2016-10-14 DIAGNOSIS — E0590 Thyrotoxicosis, unspecified without thyrotoxic crisis or storm: Secondary | ICD-10-CM

## 2016-10-14 DIAGNOSIS — Z1211 Encounter for screening for malignant neoplasm of colon: Secondary | ICD-10-CM

## 2016-10-14 NOTE — Progress Notes (Signed)
Subjective:    Patient ID: Jacob Conner, male    DOB: 11-Mar-1941, 76 y.o.   MRN: [id number]  HPI  Patient is here today for annual wellness exam and follow up of chronic medical problems which includes hyperlipidemia and hyperthyroidism. He is taking medication regularly.This patient doing well overall and only complains today of some headaches. He is being followed by the endocrinologist for his Graves' disease and hyperthyroidism. He is due to get an EKG chest x-ray and lab work today. He will return for the lab work fasting. His vital signs are stable. The patient is also followed by his ophthalmologist because of the Graves' disease. He has seen the cardiologist in the past. The patient does complain of headaches as mentioned. These headaches have bothered him most of his life but seemed to have become more frequent recently especially affecting his vision. 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 They occur about every 3 months and come on without him being aware that you're going to happen. He has seen the neurologist in the distant past but not recently. The patient denies any chest pain or shortness of breath. He denies any problems with his stomach including nausea vomiting diarrhea blood in the stool or black tarry bowel movements or change in bowel habits. It is noteworthy that he is last colonoscopy was in February 2011. His father did have colon cancer. He will need to have a repeat colonoscopy and we will arrange for him to see the gastroenterologist who did the previous colonoscopy. He's passing his water without problems. Patient is passing his water without any problems.    Patient Active Problem List   Diagnosis Date Noted  . Graves disease 10/12/2015  . 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 Hyperthyroidism 06/08/2013  . BPH (benign prostatic hyperplasia) 04/19/2013  . Hyperlipidemia 04/19/2013  . 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 Spermatocele 04/19/2013  . Hx of colonic polyp 04/19/2013  . History of migraine headaches 04/19/2013   Outpatient Encounter Prescriptions as of 10/14/2016  Medication Sig  . cholecalciferol (VITAMIN D) 1000 units tablet Take 1,000 Units by mouth daily.  . Artificial Tear Ointment (ARTIFICIAL TEARS) ointment Place 1 application into both eyes as needed.  Marland Kitchen aspirin EC 81 MG tablet Take 81 mg by mouth daily.  . finasteride (PROSCAR) 5 MG tablet TAKE (1) TABLET BY MOUTH ONCE DAILY.  . furosemide (LASIX) 20 MG tablet TAKE (1) TABLET BY MOUTH ONCE DAILY.  . methimazole (TAPAZOLE) 5 MG tablet Take 1 tablet by mouth daily.  . [DISCONTINUED] Vitamin D, Ergocalciferol, (DRISDOL) 50000 units CAPS capsule TAKE 1 CAPSULE BY MOUTH ONCE A WEEK.   No facility-administered encounter medications on file as of 10/14/2016.       Review of Systems  Constitutional: Negative.   HENT: Negative.   Eyes: Negative.   Respiratory: Negative.   Cardiovascular: Negative.   Gastrointestinal: Negative.   Endocrine: Negative.   Genitourinary: Negative.   Musculoskeletal: Negative.   Skin: Negative.   Allergic/Immunologic: Negative.   Neurological: Positive for headaches (frequently).  Hematological: Negative.   Psychiatric/Behavioral: Negative.        Objective:   Physical Exam  Constitutional: He is oriented to person, place, and time. He appears well-developed and well-nourished. No distress.  Patient is pleasant and alert.  HENT:  Head: Normocephalic and atraumatic.  Right Ear: External ear normal.  Left Ear: External ear normal.  Mouth/Throat: Oropharynx is clear and moist.  No oropharyngeal exudate.  Nasal congestion bilaterally  Eyes: Conjunctivae and EOM are normal. Pupils are equal, round, and reactive to light. Right eye exhibits no discharge. Left eye exhibits no discharge. No scleral icterus.  Neck: Normal range of motion. Neck supple. No thyromegaly present.  No bruits thyromegaly  palpable or anterior cervical adenopathy  Cardiovascular: Normal rate, regular rhythm and normal heart sounds.   No murmur heard. Distal pulses in the right foot were difficult to palpate. The heart had a regular rate and rhythm at 60/m.  Pulmonary/Chest: Effort normal and breath sounds normal. No respiratory distress. He has no wheezes. He has no rales. He exhibits no tenderness.  The chest is clear anteriorly and posteriorly and there is no axillary adenopathy  Abdominal: Soft. Bowel sounds are normal. He exhibits no mass. There is no tenderness. There is no rebound and no guarding.  No abdominal tenderness masses or bruits.  Genitourinary: Rectum normal and penis normal.  Genitourinary Comments: The prostate is slightly enlarged but soft and without lumps or masses. The rectal exam was negative for masses. The testicle on the left appears to have a hydrocele which is been palpated in the past and there is no change in this. Otherwise the external genitalia were normal.  Musculoskeletal: Normal range of motion. He exhibits no edema.  The patient does have a lot of arthritic changes in the joints of both hands. 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  Lymphadenopathy:    He has no cervical adenopathy.  Neurological: He is alert and oriented to person, place, and time. He has normal reflexes. No cranial nerve deficit.  Skin: Skin is warm and dry. No rash noted.  Psychiatric: He has a normal mood and affect. His behavior is normal. Judgment and thought content normal.  Nursing note and vitals reviewed.  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    Assessment & Plan:  1. Annual physical exam -The patient is past due on his  colonoscopy because of the positive family history of colon cancer with his father and we will make sure that this is done as soon as possible.-The patient has been having these headaches more frequently and because of the increased frequency we we will arrange for a visit with a neurologist for further evaluation.  - EKG 12-Lead - Urinalysis, Complete - CBC with Differential/Platelet; Future - BMP8+EGFR; Future - Hepatic function panel; Future - VITAMIN D 25 Hydroxy (Vit-D Deficiency, Fractures); Future - Lipid panel; Future - PSA, total and free; Future - Thyroid Panel With TSH; Future  2. Pure hypercholesterolemia -Continue current treatment pending results of lab work - CBC with Differential/Platelet; Future - BMP8+EGFR; Future - Hepatic function panel; Future - Lipid panel; Future  3. Hyperthyroidism -Continue to follow-up with endocrinology - CBC with Differential/Platelet; Future - Thyroid Panel With TSH; Future  4. Vitamin D deficiency -Continue current treatment pending results of lab work - CBC with Differential/Platelet; Future - VITAMIN D 25 Hydroxy (Vit-D Deficiency, Fractures); Future  5. Benign prostatic hyperplasia, unspecified whether lower urinary tract symptoms present -No major complaints with voiding habits -  Urinalysis, Complete - CBC with Differential/Platelet; Future  6. Aortic arch atherosclerosis ([place]) -Continue aggressive therapeutic lifestyle changes pending results of lab work - CBC with Differential/Platelet; Future - Lipid panel; Future  7. Graves disease -Follow-up with endocrinology and ophthalmology as planned - CBC with Differential/Platelet; Future - Thyroid Panel With TSH; Future  8. Family history of colon cancer -Arrange for colonoscopy - Ambulatory referral to Gastroenterology  9. Screen for colon cancer -FOBT and arrange for colonoscopy - Ambulatory referral to Gastroenterology  10. Other migraine without status migrainosus,  not intractable - Ambulatory referral to Neurology  11. Decreased pulse - US Venous Img Lower Unilateral Right; Future  12. Peripheral vascular insufficiency (HCC) -Arterial Dopplers.  Patient Instructions                       Medicare Annual Wellness Visit  [facility] and the medical providers at [facility] strive to bring you the best medical care.  In doing so we not only want to address your current medical conditions and concerns but also to detect new conditions early and prevent illness, disease and health-related problems.    Medicare offers a yearly Wellness Visit which allows our clinical staff to assess your need for preventative services including immunizations, lifestyle education, counseling to decrease risk of preventable diseases and screening for fall risk and other medical concerns.    This visit is provided free of charge (no copay) for all Medicare recipients. The clinical pharmacists at [facility] have begun to conduct these Wellness Visits which will also include a thorough review of all your medications.    As you primary medical provider recommend that you make an appointment for your Annual Wellness Visit if you have not done so already this year.  You may set up this appointment before you leave today or you may call back [phone]) and schedule an appointment.  Please make sure when you call that you mention that you are scheduling your Annual Wellness Visit with the clinical pharmacist so that the appointment may be made for the proper length of time.     Continue current medications. Continue good therapeutic lifestyle changes which include good diet and exercise. Fall precautions discussed with patient. If an FOBT was given today- please return it to our front desk. If you are over 75 years old - you may need Prevnar 91 or the adult Pneumonia vaccine.  **Flu shots are available--- please call and schedule a  FLU-CLINIC appointment**  After your visit with [place] today you will receive a survey in the mail or online from [employer] regarding your care with [place]. Please take a moment to fill this out. Your feedback is very important to [place] as you can help [place] better understand your patient needs as well as improve your experience and satisfaction. WE CARE ABOUT YOU!!!    The patient will be scheduled for arterial Dopplers of the right lower extremity. He will also be scheduled for a visit with the neurologist to further evaluate his increasing frequency of headaches and a visit with the gastroenterologist for a repeat colonoscopy because of family history of colon cancer.  Arrie Senate MD

## 2016-10-14 NOTE — Patient Instructions (Addendum)
Medicare Annual Wellness Visit  [facility] and the medical providers at [facility] strive to bring you the best medical care.  In doing so we not only want to address your current medical conditions and concerns but also to detect new conditions early and prevent illness, disease and health-related problems.    Medicare offers a yearly Wellness Visit which allows our clinical staff to assess your need for preventative services including immunizations, lifestyle education, counseling to decrease risk of preventable diseases and screening for fall risk and other medical concerns.    This visit is provided free of charge (no copay) for all Medicare recipients. The clinical pharmacists at [facility] have begun to conduct these Wellness Visits which will also include a thorough review of all your medications.    As you primary medical provider recommend that you make an appointment for your Annual Wellness Visit if you have not done so already this year.  You may set up this appointment before you leave today or you may call back ([phone]) and schedule an appointment.  Please make sure when you call that you mention that you are scheduling your Annual Wellness Visit with the clinical pharmacist so that the appointment may be made for the proper length of time.     Continue current medications. Continue good therapeutic lifestyle changes which include good diet and exercise. Fall precautions discussed with patient. If an FOBT was given today- please return it to our front desk. If you are over 23 years old - you may need Prevnar 50 or the adult Pneumonia vaccine.  **Flu shots are available--- please call and schedule a FLU-CLINIC appointment**  After your visit with [place] today you will receive a survey in the mail or online from [employer] regarding your care with [place]. Please take a moment to fill this out. Your feedback is very  important to [place] as you can help [place] better understand your patient needs as well as improve your experience and satisfaction. WE CARE ABOUT YOU!!!   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

## 2016-10-15 ENCOUNTER — Other Ambulatory Visit: Payer: Self-pay

## 2016-10-15 ENCOUNTER — Encounter (INDEPENDENT_AMBULATORY_CARE_PROVIDER_SITE_OTHER): Payer: Self-pay | Admitting: *Deleted

## 2016-10-15 ENCOUNTER — Telehealth: Payer: Self-pay

## 2016-10-15 DIAGNOSIS — R0989 Other specified symptoms and signs involving the circulatory and respiratory systems: Secondary | ICD-10-CM

## 2016-10-15 NOTE — Telephone Encounter (Signed)
Patient's wife called and the patient had most recently seen Dr. Amedeo Plenty and not Dr. Melony Overly. He saw Dr. Amedeo Plenty a little over year ago according to his wife and Dr. Amedeo Plenty said he would do the colonoscopy if the patient wanted but he did not want to do that. So what we're going to do just may sure we do a fecal occult blood test card yearly. Also, the patient is going to get arterial Dopplers on Monday and if these show blockages in the right lower extremity he will need to see the cardiologist just to be safe for his heart for the future even though he is not having any symptoms. All of this was as a result of speaking to the patient's wife.

## 2016-10-16 ENCOUNTER — Other Ambulatory Visit: Payer: Self-pay | Admitting: Family Medicine

## 2016-10-20 ENCOUNTER — Other Ambulatory Visit: Payer: Medicare Other

## 2016-10-20 ENCOUNTER — Ambulatory Visit (HOSPITAL_COMMUNITY)
Admission: RE | Admit: 2016-10-20 | Discharge: 2016-10-20 | Disposition: A | Discharge status: Home / Self Care | Payer: Medicare Other | Source: Ambulatory Visit | Attending: Family Medicine | Admitting: Family Medicine

## 2016-10-20 DIAGNOSIS — E785 Hyperlipidemia, unspecified: Secondary | ICD-10-CM | POA: Insufficient documentation

## 2016-10-20 DIAGNOSIS — N400 Enlarged prostate without lower urinary tract symptoms: Secondary | ICD-10-CM | POA: Diagnosis not present

## 2016-10-20 DIAGNOSIS — R0989 Other specified symptoms and signs involving the circulatory and respiratory systems: Secondary | ICD-10-CM | POA: Insufficient documentation

## 2016-10-20 DIAGNOSIS — E7800 Pure hypercholesterolemia, unspecified: Secondary | ICD-10-CM | POA: Diagnosis not present

## 2016-10-20 DIAGNOSIS — Z0000 Encounter for general adult medical examination without abnormal findings: Secondary | ICD-10-CM

## 2016-10-20 DIAGNOSIS — E559 Vitamin D deficiency, unspecified: Secondary | ICD-10-CM

## 2016-10-20 DIAGNOSIS — E0590 Thyrotoxicosis, unspecified without thyrotoxic crisis or storm: Secondary | ICD-10-CM

## 2016-10-20 DIAGNOSIS — E0500 Thyrotoxicosis with diffuse goiter without thyrotoxic crisis or storm: Secondary | ICD-10-CM

## 2016-10-20 DIAGNOSIS — M899 Disorder of bone, unspecified: Secondary | ICD-10-CM | POA: Diagnosis not present

## 2016-10-20 DIAGNOSIS — I700 Atherosclerosis of aorta: Secondary | ICD-10-CM

## 2016-10-21 LAB — CBC WITH DIFFERENTIAL/PLATELET
Basophils Absolute: 0 10*3/uL (ref 0.0–0.2)
Basos: 0 %
EOS (ABSOLUTE): 0.2 10*3/uL (ref 0.0–0.4)
EOS: 3 %
HEMATOCRIT: 41.9 % (ref 37.5–51.0)
HEMOGLOBIN: 14.5 g/dL (ref 13.0–17.7)
Immature Grans (Abs): 0 10*3/uL (ref 0.0–0.1)
Immature Granulocytes: 0 %
LYMPHS ABS: 1.7 10*3/uL (ref 0.7–3.1)
Lymphs: 32 %
MCH: 30.5 pg (ref 26.6–33.0)
MCHC: 34.6 g/dL (ref 31.5–35.7)
MCV: 88 fL (ref 79–97)
MONOCYTES: 10 %
Monocytes Absolute: 0.5 10*3/uL (ref 0.1–0.9)
NEUTROS ABS: 2.9 10*3/uL (ref 1.4–7.0)
Neutrophils: 55 %
Platelets: 260 10*3/uL (ref 150–379)
RBC: 4.76 x10E6/uL (ref 4.14–5.80)
RDW: 13.7 % (ref 12.3–15.4)
WBC: 5.3 10*3/uL (ref 3.4–10.8)

## 2016-10-21 LAB — THYROID PANEL WITH TSH
Free Thyroxine Index: 1.4 (ref 1.2–4.9)
T3 Uptake Ratio: 24 % (ref 24–39)
T4, Total: 6 ug/dL (ref 4.5–12.0)
TSH: 2.05 u[IU]/mL (ref 0.450–4.500)

## 2016-10-21 LAB — BMP8+EGFR
BUN/Creatinine Ratio: 16 (ref 10–24)
BUN: 16 mg/dL (ref 8–27)
CO2: 26 mmol/L (ref 18–29)
CREATININE: 0.97 mg/dL (ref 0.76–1.27)
Calcium: 8.9 mg/dL (ref 8.6–10.2)
Chloride: 101 mmol/L (ref 96–106)
GFR calc Af Amer: 87 mL/min/{1.73_m2} (ref >59)
GFR, EST NON AFRICAN AMERICAN: 75 mL/min/{1.73_m2} (ref >59)
Glucose: 93 mg/dL (ref 65–99)
Potassium: 4 mmol/L (ref 3.5–5.2)
Sodium: 141 mmol/L (ref 134–144)

## 2016-10-21 LAB — LIPID PANEL
CHOL/HDL RATIO: 6.6 ratio — AB (ref 0.0–5.0)
Cholesterol, Total: 236 mg/dL — ABNORMAL HIGH (ref 100–199)
HDL: 36 mg/dL — ABNORMAL LOW (ref >39)
LDL CALC: 173 mg/dL — AB (ref 0–99)
Triglycerides: 137 mg/dL (ref 0–149)
VLDL CHOLESTEROL CAL: 27 mg/dL (ref 5–40)

## 2016-10-21 LAB — HEPATIC FUNCTION PANEL
ALBUMIN: 4.2 g/dL (ref 3.5–4.8)
ALK PHOS: 93 IU/L (ref 39–117)
ALT: 12 IU/L (ref 0–44)
AST: 16 IU/L (ref 0–40)
BILIRUBIN, DIRECT: 0.09 mg/dL (ref 0.00–0.40)
Bilirubin Total: 0.3 mg/dL (ref 0.0–1.2)
TOTAL PROTEIN: 6.2 g/dL (ref 6.0–8.5)

## 2016-10-21 LAB — PSA, TOTAL AND FREE
PROSTATE SPECIFIC AG, SERUM: 0.5 ng/mL (ref 0.0–4.0)
PSA FREE: 0.08 ng/mL
PSA, Free Pct: 16 %

## 2016-10-21 LAB — VITAMIN D 25 HYDROXY (VIT D DEFICIENCY, FRACTURES): Vit D, 25-Hydroxy: 39.3 ng/mL (ref 30.0–100.0)

## 2016-10-23 DIAGNOSIS — H11423 Conjunctival edema, bilateral: Secondary | ICD-10-CM | POA: Diagnosis not present

## 2016-10-23 DIAGNOSIS — E0500 Thyrotoxicosis with diffuse goiter without thyrotoxic crisis or storm: Secondary | ICD-10-CM | POA: Diagnosis not present

## 2016-10-23 DIAGNOSIS — H11441 Conjunctival cysts, right eye: Secondary | ICD-10-CM | POA: Diagnosis not present

## 2016-10-23 DIAGNOSIS — G43109 Migraine with aura, not intractable, without status migrainosus: Secondary | ICD-10-CM | POA: Diagnosis not present

## 2016-10-23 DIAGNOSIS — H04123 Dry eye syndrome of bilateral lacrimal glands: Secondary | ICD-10-CM | POA: Diagnosis not present

## 2016-10-23 DIAGNOSIS — Z87891 Personal history of nicotine dependence: Secondary | ICD-10-CM | POA: Diagnosis not present

## 2016-10-23 DIAGNOSIS — H2513 Age-related nuclear cataract, bilateral: Secondary | ICD-10-CM | POA: Diagnosis not present

## 2016-10-23 DIAGNOSIS — H11823 Conjunctivochalasis, bilateral: Secondary | ICD-10-CM | POA: Diagnosis not present

## 2016-11-10 ENCOUNTER — Ambulatory Visit (INDEPENDENT_AMBULATORY_CARE_PROVIDER_SITE_OTHER): Payer: Medicare Other | Admitting: Neurology

## 2016-11-10 ENCOUNTER — Encounter: Payer: Self-pay | Admitting: Neurology

## 2016-11-10 DIAGNOSIS — G43119 Migraine with aura, intractable, without status migrainosus: Secondary | ICD-10-CM

## 2016-11-10 HISTORY — DX: Migraine with aura, intractable, without status migrainosus: G43.119

## 2016-11-10 MED ORDER — TOPIRAMATE 25 MG PO TABS
ORAL_TABLET | ORAL | 3 refills | Status: DC
Start: 2016-11-10 — End: 2017-01-14

## 2016-11-10 NOTE — Patient Instructions (Signed)
   We will start topamax for the headache.   Topamax (topiramate) is a seizure medication that has an FDA approval for seizures and for migraine headache. Potential side effects of this medication include weight loss, cognitive slowing, tingling in the fingers and toes, and carbonated drinks will taste bad. If any significant side effects are noted on this drug, please contact our office.

## 2016-11-10 NOTE — Progress Notes (Signed)
Reason for visit: Classic migraine headache  Referring physician: Dr. Izora Ribas is a 76 y.o. male  History of present illness:  Jacob Conner is a 76 year old right-handed white male with a history of migraine headaches since he was in his early 60s. The headaches generally have been fairly infrequently occurring once or twice a year. The patient generally has a visual aura that may last all day if he does not get to sleep. If the patient gets to sleep the visual aura will be over in about one hour. The patient gets patchy scotomata in the visual field that crosses midline, he may get sparkles as well. After the visual episodes are over, the patient will have problems with feeling spacey. He may get confused, and he may have aphasia with the episodes. The patient in the past has had events where he will have some tingling in the upper lips and hands. This has not occurred recently. Occasionally, he may have some nausea, but no vomiting. He denies any weakness of the extremities, he may have some dizziness and some slight gait instability. The day following the visual deficit, he will get a pounding headache when he bends or stoops. Currently he is having about 2 headaches a week, this began about 6 weeks ago. He has been under some stress with raising 2 grandchildren. The patient denies any other particular activators for the headache. He has been seen by Dr. Theodoro Clock in the distant past for these headaches. His father and his paternal grandmother also had similar events.  Past Medical History:  Diagnosis Date  . Allergy   . Arthritis   . BPH (benign prostatic hyperplasia)   . 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 Graves disease   . Hyperlipidemia   . Prostate disorder   . Thyroid disease    Hyperthyroidism    Past Surgical History:  Procedure Laterality Date  . HYDROCELE EXCISION    . LITHOTRIPSY      Family History  Problem Relation Age of Onset  .  Liver disease Mother     Social history:  reports that he has never smoked. His smokeless tobacco use includes Chew. He reports that he does not drink alcohol or use drugs.  Medications:  Prior to Admission medications   Medication Sig Start Date End Date Taking? Authorizing Provider  Artificial Tear Ointment (ARTIFICIAL TEARS) ointment Place 1 application into both eyes as needed.   Yes Historical Provider, MD  aspirin EC 81 MG tablet Take 81 mg by mouth daily.   Yes Historical Provider, MD  finasteride (PROSCAR) 5 MG tablet TAKE 1 TABLET ONCE DAILY. 10/16/16  Yes Chipper Herb, MD  furosemide (LASIX) 20 MG tablet TAKE (1) TABLET BY MOUTH ONCE DAILY. 12/10/15  Yes Chipper Herb, MD  methimazole (TAPAZOLE) 5 MG tablet Take 1 tablet by mouth daily. 09/15/14  Yes Historical Provider, MD  Vitamin D, Ergocalciferol, (DRISDOL) 50000 units CAPS capsule TAKE 1 CAPSULE BY MOUTH ONCE A WEEK. 10/16/16  Yes Chipper Herb, MD     No Known Allergies  ROS:  Out of a complete 14 system review of symptoms, the patient complains only of the following symptoms, and all other reviewed systems are negative.  Swelling in the legs Hearing loss, ringing in the ears Joint swelling Headache Decreased energy  Blood pressure 131/71, pulse (!) 59, height 5\' 6"  (1.676 m), weight 182 lb 8 oz (82.8 kg).  Physical Exam  General: The  patient is alert and cooperative at the time of the examination.  Eyes: Pupils are equal, round, and reactive to light. Discs are flat bilaterally.  Neck: The neck is supple, no carotid bruits are noted.  Respiratory: The respiratory examination is clear.  Cardiovascular: The cardiovascular examination reveals a regular rate and rhythm, no obvious murmurs or rubs are noted.  Skin: Extremities are without significant edema.  Neurologic Exam  Mental status: The patient is alert and oriented x 3 at the time of the examination. The patient has apparent normal recent and remote  memory, with an apparently normal attention span and concentration ability.  Cranial nerves: Facial symmetry is present. There is good sensation of the face to pinprick and soft touch bilaterally. The strength of the facial muscles and the muscles to head turning and shoulder shrug are normal bilaterally. Speech is well enunciated, no aphasia or dysarthria is noted. Extraocular movements are full. Visual fields are full. The tongue is midline, and the patient has symmetric elevation of the soft palate. No obvious hearing deficits are noted.  Motor: The motor testing reveals 5 over 5 strength of all 4 extremities. Good symmetric motor tone is noted throughout.  Sensory: Sensory testing is intact to pinprick, soft touch, and vibration sensation on all 4 extremities. Position sensation is normal in arms, decreased in both feet. No evidence of extinction is noted.  Coordination: Cerebellar testing reveals good finger-nose-finger and heel-to-shin bilaterally.  Gait and station: Gait is normal. Tandem gait is slightly unsteady. Romberg is negative. No drift is seen.  Reflexes: Deep tendon reflexes are symmetric and normal bilaterally. Toes are downgoing bilaterally.   Assessment/Plan:  1. Classic migraine headache  The patient is having classic migraine that seems to improve with sleep. The frequency of the events has increased over the last 6 weeks. The patient will be placed on Topamax for the headache. He will follow-up in about 3 months. He will call for any dose adjustments of the medication.  Jill Alexanders MD 11/10/2016 9:47 AM  [facility] [address] [place], [place] [address]  Phone [phone] Fax [phone]

## 2016-11-19 ENCOUNTER — Other Ambulatory Visit: Payer: Self-pay | Admitting: Family Medicine

## 2017-01-13 DIAGNOSIS — Z79899 Other long term (current) drug therapy: Secondary | ICD-10-CM | POA: Diagnosis not present

## 2017-01-13 DIAGNOSIS — E0500 Thyrotoxicosis with diffuse goiter without thyrotoxic crisis or storm: Secondary | ICD-10-CM | POA: Diagnosis not present

## 2017-01-14 ENCOUNTER — Telehealth: Payer: Self-pay | Admitting: Neurology

## 2017-01-14 MED ORDER — ZONISAMIDE 25 MG PO CAPS: ORAL_CAPSULE | ORAL | 1 refills | Status: DC

## 2017-01-14 NOTE — Telephone Encounter (Signed)
Pt wife calling OT:LXBWIOMBTD (TOPAMAX) 25 MG tablet She said that it is helping with the headaches but draining him in other ways such as speech, and not being able to think clearly.  Please call

## 2017-01-14 NOTE — Telephone Encounter (Signed)
I called the patient. The patient has done very well on the Topamax with control the headaches at 50 mg, but he has severe cognitive side effects, has difficulty with cognitive processing.  When he tried to back down to 1 tablet of Topamax at night the headaches came back. The patient will be laced on Zonegran, he'll taper off of the Topamax by going on 25 mg at night for a week and then stop. He will be going up on the Zonegran by 25 mg a week until he gets to 75 mg at night. He will call for any dose adjustments.

## 2017-01-14 NOTE — Addendum Note (Signed)
Addended by: Kathrynn Ducking on: 01/14/2017 12:25 PM   Modules accepted: Orders

## 2017-01-19 ENCOUNTER — Other Ambulatory Visit: Payer: Self-pay | Admitting: Family Medicine

## 2017-02-03 DIAGNOSIS — Z85820 Personal history of malignant melanoma of skin: Secondary | ICD-10-CM | POA: Diagnosis not present

## 2017-02-03 DIAGNOSIS — L570 Actinic keratosis: Secondary | ICD-10-CM | POA: Diagnosis not present

## 2017-02-03 DIAGNOSIS — L249 Irritant contact dermatitis, unspecified cause: Secondary | ICD-10-CM | POA: Diagnosis not present

## 2017-02-03 DIAGNOSIS — L814 Other melanin hyperpigmentation: Secondary | ICD-10-CM | POA: Diagnosis not present

## 2017-02-03 DIAGNOSIS — D225 Melanocytic nevi of trunk: Secondary | ICD-10-CM | POA: Diagnosis not present

## 2017-02-10 ENCOUNTER — Ambulatory Visit (INDEPENDENT_AMBULATORY_CARE_PROVIDER_SITE_OTHER): Payer: Medicare Other | Admitting: Adult Health

## 2017-02-10 ENCOUNTER — Encounter: Payer: Self-pay | Admitting: Adult Health

## 2017-02-10 VITALS — BP 110/59 | HR 60 | Wt 177.8 lb

## 2017-02-10 DIAGNOSIS — G43119 Migraine with aura, intractable, without status migrainosus: Secondary | ICD-10-CM | POA: Diagnosis not present

## 2017-02-10 NOTE — Progress Notes (Signed)
I have read the note, and I agree with the clinical assessment and plan.  Hermann Dottavio KEITH   

## 2017-02-10 NOTE — Progress Notes (Signed)
PATIENT: Jacob Conner DOB: 1940/08/16  REASON FOR VISIT: follow up- migraine headache HISTORY FROM: patient  HISTORY OF PRESENT ILLNESS: Jacob Conner is a 76 year old male with a history of migraine headaches. He returns today for follow-up. He was originally placed on Topamax however he was unable to tolerate this medication. He was switched to [place]. He reports that this medication seems to be working well. He did have two headaches this past week. He reports that his headaches always consists of changes in his vision. He states that he will have lightning bolts that start on the right periphery and shoot across to the left periphery. He states if he is able to lay down these typically resolve within an hour. He reports that a headache never follows. He just recently worked up to 75 mg. Reports he's been taking this for about a week. He reports that he is tolerating the medication well. He returns today for an evaluation.  HISTORY 11/10/16: Jacob Conner is a 76 year old right-handed white male with a history of migraine headaches since he was in his early 15s. The headaches generally have been fairly infrequently occurring once or twice a year. The patient generally has a visual aura that may last all day if he does not get to sleep. If the patient gets to sleep the visual aura will be over in about one hour. The patient gets patchy scotomata in the visual field that crosses midline, he may get sparkles as well. After the visual episodes are over, the patient will have problems with feeling spacey. He may get confused, and he may have aphasia with the episodes. The patient in the past has had events where he will have some tingling in the upper lips and hands. This has not occurred recently. Occasionally, he may have some nausea, but no vomiting. He denies any weakness of the extremities, he may have some dizziness and some slight gait instability. The day following the visual deficit, he will get a  pounding headache when he bends or stoops. Currently he is having about 2 headaches a week, this began about 6 weeks ago. He has been under some stress with raising 2 grandchildren. The patient denies any other particular activators for the headache. He has been seen by Dr. Theodoro Clock in the distant past for these headaches. His father and his paternal grandmother also had similar events.   REVIEW OF SYSTEMS: Out of a complete 14 system review of symptoms, the patient complains only of the following symptoms, and all other reviewed systems are negative.  See history of present illness  ALLERGIES: Allergies  Allergen Reactions  . Topamax [Topiramate]     Cognitive clouding    HOME MEDICATIONS: Outpatient Medications Prior to Visit  Medication Sig Dispense Refill  . Artificial Tear Ointment (ARTIFICIAL TEARS) ointment Place 1 application into both eyes as needed.    Marland Kitchen aspirin EC 81 MG tablet Take 81 mg by mouth daily.    . finasteride (PROSCAR) 5 MG tablet TAKE 1 TABLET ONCE DAILY. 30 tablet 2  . furosemide (LASIX) 20 MG tablet TAKE (1) TABLET BY MOUTH ONCE DAILY. 30 tablet 3  . Vitamin D, Ergocalciferol, (DRISDOL) 50000 units CAPS capsule TAKE 1 CAPSULE BY MOUTH ONCE A WEEK. 12 capsule 0  . zonisamide (ZONEGRAN) 25 MG capsule Take one capsule at night for one week, then take 2 capsules at night for one week, then take 3 capsules at night 90 capsule 1  . methimazole (TAPAZOLE) 5 MG  tablet Take 1 tablet by mouth daily.     No facility-administered medications prior to visit.     PAST MEDICAL HISTORY: Past Medical History:  Diagnosis Date  . Allergy   . Arthritis   . BPH (benign prostatic hyperplasia)   . 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 Graves disease   . Hyperlipidemia   . Prostate disorder   . Thyroid disease    Hyperthyroidism    PAST SURGICAL HISTORY: Past Surgical History:  Procedure Laterality Date  . HYDROCELE EXCISION    . LITHOTRIPSY       FAMILY HISTORY: Family History  Problem Relation Age of Onset  . Liver disease Mother     SOCIAL HISTORY: Social History   Social History  . Marital status: Married    Spouse name: Butch Penny  . Number of children: N/A  . Years of education: N/A   Occupational History  . Not on file.   Social History Main Topics  . Smoking status: Never Smoker  . Smokeless tobacco: Current User    Types: Chew     Comment: chew 1 pack per day since he was 76 years old  . Alcohol use No  . Drug use: No  . Sexual activity: Not on file   Other Topics Concern  . Not on file   Social History Narrative   Lives with wife   Caffeine use: 1 cup coffee per day, 1 soda per day   Right-handed      PHYSICAL EXAM  Vitals:   02/10/17 1121  BP: (!) 110/59  Pulse: 60  Weight: 177 lb 12.8 oz (80.6 kg)   Body mass index is 28.7 kg/m.  Generalized: Well developed, in no acute distress   Neurological examination  Mentation: Alert oriented to time, place, history taking. Follows all commands speech and language fluent Cranial nerve II-XII: Pupils were equal round reactive to light. Extraocular movements were full, visual field were full on confrontational test. Facial sensation and strength were normal. Uvula tongue midline. Head turning and shoulder shrug  were normal and symmetric. Motor: The motor testing reveals 5 over 5 strength of all 4 extremities. Good symmetric motor tone is noted throughout.  Sensory: Sensory testing is intact to soft touch on all 4 extremities. No evidence of extinction is noted.  Coordination: Cerebellar testing reveals good finger-nose-finger and heel-to-shin bilaterally.  Gait and station: Gait is normal. Tandem gait is normal. Romberg is negative. No drift is seen.  Reflexes: Deep tendon reflexes are symmetric and normal bilaterally.   DIAGNOSTIC DATA (LABS, IMAGING, TESTING) - I reviewed patient records, labs, notes, testing and imaging myself where  available.  Lab Results  Component Value Date   WBC 5.3 10/20/2016   HGB 14.5 10/20/2016   HCT 41.9 10/20/2016   MCV 88 10/20/2016   PLT 260 10/20/2016      Component Value Date/Time   NA 141 10/20/2016 0803   K 4.0 10/20/2016 0803   CL 101 10/20/2016 0803   CO2 26 10/20/2016 0803   GLUCOSE 93 10/20/2016 0803   BUN 16 10/20/2016 0803   CREATININE 0.97 10/20/2016 0803   CALCIUM 8.9 10/20/2016 0803   PROT 6.2 10/20/2016 0803   ALBUMIN 4.2 10/20/2016 0803   AST 16 10/20/2016 0803   ALT 12 10/20/2016 0803   ALKPHOS 93 10/20/2016 0803   BILITOT 0.3 10/20/2016 0803   GFRNONAA 75 10/20/2016 0803   GFRAA 87 10/20/2016 0803   Lab Results  Component Value Date  CHOL 236 (H) 10/20/2016   HDL 36 (L) 10/20/2016   LDLCALC 173 (H) 10/20/2016   TRIG 137 10/20/2016   CHOLHDL 6.6 (H) 10/20/2016    Lab Results  Component Value Date   TSH 2.050 10/20/2016      ASSESSMENT AND PLAN 77 y.o. year old male  has a past medical history of Allergy; Arthritis; BPH (benign prostatic hyperplasia); Classical migraine with intractable migraine (11/10/2016); Graves disease; Hyperlipidemia; Prostate disorder; and Thyroid disease. here with:  1. Migraine headache  The patient will remain on Zonegran 75 mg at bedtime. If his headache frequency increases he will let us know. He is advised that if his symptoms worsen or something new. The patient let us know. He will follow-up in 6 months or sooner if needed.  I spent 15 minutes with the patient. 50% of this time was spent discussing medication    Ward Givens, MSN, NP-C 02/10/2017, 11:22 AM [facility] [address], [place], [place] [address] [phone]

## 2017-02-10 NOTE — Patient Instructions (Addendum)
Your Plan:  Continue Zonegran 75 mg at bedtime If your symptoms worsen or you develop new symptoms please let us know.   Thank you for coming to see [place] at [facility]. I hope we have been able to provide you high quality care today.  You may receive a patient satisfaction survey over the next few weeks. We would appreciate your feedback and comments so that we may continue to improve ourselves and the health of our patients.

## 2017-03-11 DIAGNOSIS — H6123 Impacted cerumen, bilateral: Secondary | ICD-10-CM | POA: Diagnosis not present

## 2017-03-23 ENCOUNTER — Other Ambulatory Visit: Payer: Self-pay | Admitting: Neurology

## 2017-03-25 DIAGNOSIS — H2513 Age-related nuclear cataract, bilateral: Secondary | ICD-10-CM | POA: Diagnosis not present

## 2017-03-25 DIAGNOSIS — E0500 Thyrotoxicosis with diffuse goiter without thyrotoxic crisis or storm: Secondary | ICD-10-CM | POA: Diagnosis not present

## 2017-04-29 DIAGNOSIS — E0500 Thyrotoxicosis with diffuse goiter without thyrotoxic crisis or storm: Secondary | ICD-10-CM | POA: Diagnosis not present

## 2017-04-29 DIAGNOSIS — R51 Headache: Secondary | ICD-10-CM | POA: Diagnosis not present

## 2017-04-29 DIAGNOSIS — H0520 Unspecified exophthalmos: Secondary | ICD-10-CM | POA: Diagnosis not present

## 2017-04-29 DIAGNOSIS — H5359 Other color vision deficiencies: Secondary | ICD-10-CM | POA: Diagnosis not present

## 2017-05-04 ENCOUNTER — Ambulatory Visit (INDEPENDENT_AMBULATORY_CARE_PROVIDER_SITE_OTHER): Payer: Medicare Other | Admitting: *Deleted

## 2017-05-04 ENCOUNTER — Other Ambulatory Visit: Payer: Self-pay | Admitting: Family Medicine

## 2017-05-04 DIAGNOSIS — Z23 Encounter for immunization: Secondary | ICD-10-CM

## 2017-05-05 ENCOUNTER — Other Ambulatory Visit: Payer: Self-pay | Admitting: Family Medicine

## 2017-05-13 ENCOUNTER — Ambulatory Visit: Payer: Medicare Other

## 2017-05-14 DIAGNOSIS — R51 Headache: Secondary | ICD-10-CM | POA: Diagnosis not present

## 2017-05-14 DIAGNOSIS — I639 Cerebral infarction, unspecified: Secondary | ICD-10-CM | POA: Diagnosis not present

## 2017-05-14 DIAGNOSIS — E0500 Thyrotoxicosis with diffuse goiter without thyrotoxic crisis or storm: Secondary | ICD-10-CM | POA: Diagnosis not present

## 2017-05-15 ENCOUNTER — Telehealth: Payer: Self-pay | Admitting: Neurology

## 2017-05-15 DIAGNOSIS — I639 Cerebral infarction, unspecified: Secondary | ICD-10-CM

## 2017-05-15 NOTE — Telephone Encounter (Signed)
Pt wife(on DPR) calling to inform pt went to his new Dr re: his Graves Disease.  In questioning, pt wife states the Dr ordered a MRI with concern of a stroke in pt history.  Pt wife states Dr called them back last night and stated, MRI showed infarct in frontal lobe and could not tell when it was.  Pt wife states he is having no symptoms. Pt wife wants Dr Jannifer Franklin to be notified and to call pt.  The wife says she is working [employer] and it is best to call pt.  Wife states if Dr Jannifer Franklin is not avail to reach husband please call wife on mobile and she will attempt to take call.

## 2017-05-15 NOTE — Telephone Encounter (Signed)
I called the patient, talk with the wife.  The patient was seen by an ophthalmologist for Graves' disease, she set him up for MRI of the brain, this apparently showed a chronic frontal stroke.  Apparently this study was done at [facility], but I see no evidence of the report in care everywhere.  We will call the office of Dr. Bryon Lions and get the report of the MRI.

## 2017-05-16 ENCOUNTER — Other Ambulatory Visit: Payer: Self-pay | Admitting: Family Medicine

## 2017-05-19 DIAGNOSIS — E0500 Thyrotoxicosis with diffuse goiter without thyrotoxic crisis or storm: Secondary | ICD-10-CM | POA: Diagnosis not present

## 2017-05-19 NOTE — Addendum Note (Signed)
Addended by: Kathrynn Ducking on: 05/19/2017 06:20 PM   Modules accepted: Orders

## 2017-05-19 NOTE — Telephone Encounter (Signed)
Received MRI brain report from [facility] completed on 05/14/17. Gave to CW,MD for review.

## 2017-05-19 NOTE — Telephone Encounter (Signed)
The report of the MRI of the brain was obtained, this was done on 14 May 2017.  This shows an acute or subacute punctate infarct in the high right frontal lobe.  This event likely was completely asymptomatic.  This has nothing to do with his chronic headache.  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  Otherwise, no significant small vessel disease was noted.  Talk with the patient, I will get a 2D echocardiogram and a carotid Doppler study set up to evaluate this recent stroke.

## 2017-05-25 ENCOUNTER — Telehealth: Payer: Self-pay | Admitting: *Deleted

## 2017-05-25 NOTE — Telephone Encounter (Signed)
R/c pt Cd form [facility], Cd on [employer]

## 2017-06-03 ENCOUNTER — Other Ambulatory Visit: Payer: Self-pay | Admitting: Family Medicine

## 2017-06-09 ENCOUNTER — Ambulatory Visit (HOSPITAL_BASED_OUTPATIENT_CLINIC_OR_DEPARTMENT_OTHER)
Admission: RE | Admit: 2017-06-09 | Discharge: 2017-06-09 | Disposition: A | Discharge status: Home / Self Care | Payer: Medicare Other | Source: Ambulatory Visit | Attending: Neurology | Admitting: Neurology

## 2017-06-09 ENCOUNTER — Ambulatory Visit (HOSPITAL_COMMUNITY)
Admission: RE | Admit: 2017-06-09 | Discharge: 2017-06-09 | Disposition: A | Discharge status: Home / Self Care | Payer: Medicare Other | Source: Ambulatory Visit | Attending: Neurology | Admitting: Neurology

## 2017-06-09 ENCOUNTER — Telehealth: Payer: Self-pay | Admitting: Neurology

## 2017-06-09 DIAGNOSIS — I639 Cerebral infarction, unspecified: Secondary | ICD-10-CM

## 2017-06-09 DIAGNOSIS — Z8673 Personal history of transient ischemic attack (TIA), and cerebral infarction without residual deficits: Secondary | ICD-10-CM | POA: Diagnosis not present

## 2017-06-09 DIAGNOSIS — E785 Hyperlipidemia, unspecified: Secondary | ICD-10-CM | POA: Diagnosis not present

## 2017-06-09 DIAGNOSIS — I6521 Occlusion and stenosis of right carotid artery: Secondary | ICD-10-CM

## 2017-06-09 NOTE — Telephone Encounter (Signed)
  I called the patient, talk with the wife.  The echocardiogram on the heart was unremarkable.  2D echo 06/09/17:  Study Conclusions  - Left ventricle: The cavity size was normal. There was moderate concentric hypertrophy. Systolic function was normal. The estimated ejection fraction was in the range of 60% to 65%. Wall motion was normal; there were no regional wall motion abnormalities. Doppler parameters are consistent with abnormal left ventricular relaxation (grade 1 diastolic dysfunction). Doppler parameters are consistent with elevated ventricular end-diastolic filling pressure. - Aortic valve: Trileaflet; mildly thickened, mildly calcified leaflets. There was trivial regurgitation. - Mitral valve: There was trivial regurgitation. - Left atrium: The atrium was moderately dilated. - Right ventricle: The cavity size was normal. Wall thickness was normal. Systolic function was normal. - Right atrium: The atrium was normal in size. - Pulmonary arteries: Systolic pressure could not be accurately estimated. - Inferior vena cava: The vessel was normal in size. - Pericardium, extracardiac: There was no pericardial effusion.  Impressions:  - No cardiac source of emboli was indentified.

## 2017-06-09 NOTE — Telephone Encounter (Signed)
I called the patient.  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  I have recommended a surgical referral, they are amenable to this, I will get this set up.

## 2017-06-09 NOTE — Progress Notes (Signed)
Carotid duplex completed. 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 Left - No significant ICA stenosis. Bilateral -Vertebral artery flow is antegrade.  [place] Michaelia Beilfuss,RVS 06/09/2017 12:26 PM

## 2017-06-09 NOTE — Telephone Encounter (Signed)
Pt wife(on DPR ) has called she is aware of Dr Tobey Grim entry @ 1:14 she has asked that this message be sent stating she would very much like to be contacted just as soon as there is additional information avail.  The home and mobile # 's are fine to use, either one.

## 2017-06-09 NOTE — Progress Notes (Signed)
  Echocardiogram 2D Echocardiogram has been performed.  Jacob Conner 06/09/2017, 11:05 AM

## 2017-06-10 ENCOUNTER — Other Ambulatory Visit: Payer: Self-pay

## 2017-06-10 DIAGNOSIS — I6521 Occlusion and stenosis of right carotid artery: Secondary | ICD-10-CM

## 2017-06-15 ENCOUNTER — Telehealth: Payer: Self-pay | Admitting: *Deleted

## 2017-06-15 NOTE — Telephone Encounter (Signed)
Patient's wife called and requested appointment with" first available surgeon". Was sched. To see Dr. Donnetta Hutching in January, refer from Dr. Jannifer Franklin. States they do not want to wait that long. Office to call her back with appointment.

## 2017-06-24 ENCOUNTER — Other Ambulatory Visit: Payer: Self-pay

## 2017-06-24 ENCOUNTER — Ambulatory Visit (HOSPITAL_COMMUNITY)
Admission: RE | Admit: 2017-06-24 | Discharge: 2017-06-24 | Disposition: A | Discharge status: Home / Self Care | Payer: Medicare Other | Source: Ambulatory Visit | Attending: Vascular Surgery | Admitting: Vascular Surgery

## 2017-06-24 DIAGNOSIS — I6521 Occlusion and stenosis of right carotid artery: Secondary | ICD-10-CM

## 2017-06-24 DIAGNOSIS — Z01818 Encounter for other preprocedural examination: Secondary | ICD-10-CM

## 2017-06-24 DIAGNOSIS — I6529 Occlusion and stenosis of unspecified carotid artery: Secondary | ICD-10-CM

## 2017-06-24 LAB — VAS US CAROTID
RCCADSYS: -31 cm/s
RCCAPDIAS: -8 cm/s
RCCAPSYS: -61 cm/s
RIGHT CCA MID DIAS: 13 cm/s
RIGHT ECA DIAS: -14 cm/s
RIGHT VERTEBRAL DIAS: 8 cm/s

## 2017-06-25 ENCOUNTER — Ambulatory Visit (HOSPITAL_COMMUNITY)
Admission: RE | Admit: 2017-06-25 | Discharge: 2017-06-25 | Disposition: A | Discharge status: Home / Self Care | Payer: Medicare Other | Source: Ambulatory Visit | Attending: Vascular Surgery | Admitting: Vascular Surgery

## 2017-06-25 DIAGNOSIS — I6529 Occlusion and stenosis of unspecified carotid artery: Secondary | ICD-10-CM

## 2017-06-25 DIAGNOSIS — I6521 Occlusion and stenosis of right carotid artery: Secondary | ICD-10-CM | POA: Insufficient documentation

## 2017-06-25 LAB — POCT I-STAT CREATININE: CREATININE: 1.2 mg/dL (ref 0.61–1.24)

## 2017-06-25 MED ORDER — IOPAMIDOL (ISOVUE-370) INJECTION 76%
INTRAVENOUS | Status: AC
  Administered 2017-06-25: 50 mL
  Filled 2017-06-25: qty 50

## 2017-06-26 ENCOUNTER — Encounter: Payer: Self-pay | Admitting: Vascular Surgery

## 2017-06-26 ENCOUNTER — Encounter: Payer: Self-pay | Admitting: *Deleted

## 2017-06-26 ENCOUNTER — Ambulatory Visit: Payer: Medicare Other | Admitting: Vascular Surgery

## 2017-06-26 ENCOUNTER — Other Ambulatory Visit: Payer: Self-pay

## 2017-06-26 VITALS — BP 133/81 | HR 60 | Temp 97.5°F | Resp 16 | Ht 66.0 in | Wt 174.0 lb

## 2017-06-26 DIAGNOSIS — I6521 Occlusion and stenosis of right carotid artery: Secondary | ICD-10-CM | POA: Diagnosis not present

## 2017-06-27 ENCOUNTER — Other Ambulatory Visit: Payer: Self-pay | Admitting: Family Medicine

## 2017-06-27 NOTE — Progress Notes (Signed)
Patient ID: Jacob Conner, male   DOB: [birth year], 76 y.o.   MRN: [id number]  Reason for Consult: New Patient (Initial Visit) (carotid stenosis  right)   Referred by Kathrynn Ducking, MD  Subjective:     HPI:  Jacob Conner is a 76 y.o. 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 He does not have associated neurologic deficits. Has never had stroke, tia symptoms. Denies amaurosis. Takes aspirin daily. No blood thinners. Walks without limitation.  Past Medical History:  Diagnosis Date  . Allergy   . Arthritis   . BPH (benign prostatic hyperplasia)   . 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 Graves disease   . Hyperlipidemia   . Prostate disorder   . Thyroid disease    Hyperthyroidism   Family History  Problem Relation Age of Onset  . Liver disease Mother    Past Surgical History:  Procedure Laterality Date  . HYDROCELE EXCISION    . LITHOTRIPSY      Short Social History:  Social History   Tobacco Use  . Smoking status: Never Smoker  . Smokeless tobacco: Current User    Types: Chew  . Tobacco comment: chew 1 pack per day since he was 76 years old  Substance Use Topics  . Alcohol use: No    Allergies  Allergen Reactions  . Topiramate Other (See Comments)    Cognitive clouding Cognitive clouding    Current Outpatient Medications  Medication Sig Dispense Refill  . Artificial Tear Ointment (ARTIFICIAL TEARS) ointment Place 1 application into both eyes as needed.    Marland Kitchen aspirin EC 81 MG tablet Take 81 mg by mouth daily.    . finasteride (PROSCAR) 5 MG tablet TAKE 1 TABLET ONCE DAILY. 30 tablet 0  . furosemide (LASIX) 20 MG tablet TAKE (1) TABLET BY MOUTH ONCE DAILY. 30 tablet 0  . methimazole (TAPAZOLE) 5 MG tablet Take 5 mg by mouth.    . Vitamin D, Ergocalciferol, (DRISDOL) 50000 units CAPS capsule TAKE 1 CAPSULE BY MOUTH ONCE A WEEK. 4 capsule 0  . zonisamide (ZONEGRAN) 25 MG capsule  TAKE 1 CAPSULE AT BEDTIME 1 WEEK, THEN TAKE 2 CAPSULES AT BEDTIME 1 WEEK, THEN TAKE 3 CAPSULES AT BEDTIME. 90 capsule 5   No current facility-administered medications for this visit.     Review of Systems  Constitutional:  Constitutional negative. Eyes: Eyes negative.  Respiratory: Respiratory negative.  Cardiovascular: Cardiovascular negative.  GI: Gastrointestinal negative.  Musculoskeletal: Musculoskeletal negative.  Skin: Skin negative.  Neurological: Positive for headaches.  Hematologic: Hematologic/lymphatic negative.  Psychiatric: Psychiatric negative.        Objective:  Objective   Vitals:   06/26/17 1218 06/26/17 1223  BP: 139/79 133/81  Pulse: 61 60  Resp: 16   Temp: (!) 97.5 F (36.4 C)   TempSrc: Oral   SpO2: 100%   Weight: 174 lb (78.9 kg)   Height: 5\' 6"  (1.676 m)    Body mass index is 28.08 kg/m.  Physical Exam  Constitutional: He appears well-developed.  HENT:  Head: Normocephalic.  Neck: Normal range of motion.  Cardiovascular: Normal rate.  Pulses:      Radial pulses are 2+ on the right side, and 2+ on the left side.       Femoral pulses are 2+ on the right side, and 2+ on the left side. Pulmonary/Chest: Effort normal.  Abdominal: Soft. He exhibits no mass.  Musculoskeletal:  Normal range of motion. He exhibits no edema.  Neurological: He is alert.  Skin: Skin is warm and dry.  Psychiatric: He has a normal mood and affect. His behavior is normal. Judgment and thought content normal.    Data: CTA IMPRESSION: 1. Predominantly soft plaque at the right carotid bifurcation resulting in critical ICA stenosis. 2. No left-sided carotid artery stenosis. 3. Patent circle of Willis without significant stenosis.      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 Dedicated, thin section axial and coronal images of the orbits (using fat saturation as appropriate) were also obtained before and after intravenous administration of gadolinium-based contrast.  FINDINGS:    ORBITS  Periorbital soft tissues: Normal.  Globes: Symmetrical, normal contour and signal.  Optic nerves: Normal size, contour, and signal bilaterally.  Extraocular muscles: Similar to improved thickening of bilateral extraocular muscles, most pronounced along the inferior recti bilaterally. The next most thickened muscles or the medial recti. The oblique and lateral recti muscles are relatively spared. There is also sparing of the myotendinous junctions.    BRAIN  Calvarium/skull base: No focal marrow replacing lesion suggestive of neoplasm.  Paranasal sinuses: Imaged portions clear.  Brain: Punctate focus of restricted diffusion in the high right frontal lobe. Remote right cerebellar infarct. Left cerebellar DVA. No mass effect, hemorrhage, or hydrocephalus. No abnormal enhancement to suggest neoplasm, abscess, or mass lesion. Grossly normal flow voids in the major intracranial arteries and dural venous sinuses. Mild diffuse cerebral and loss.    Assessment/Plan:     76yo wm with remote history of right sided cva and ongoing headaches. Has string sign by CTA with significantly elevated velocities on duplex. We discussed options of medical management, TCAR and CEA. Will proceed with Right CEA in near future. Counseled on signs/symptoms of stroke that merit emergent attention. Discussed risks of surgery including stroke, MI and slightly higher risk of cranial nerve injury given high bifurcaiton.      Waynetta Sandy MD [facility]

## 2017-06-30 ENCOUNTER — Other Ambulatory Visit: Payer: Self-pay | Admitting: *Deleted

## 2017-07-02 NOTE — Progress Notes (Signed)
Spoke with Jacob Daniel, RN at Dr. Claretha Cooper office, confirmed that patient will continue Aspirin but will not take it the day of surgery.

## 2017-07-02 NOTE — Pre-Procedure Instructions (Signed)
Jacob Conner  07/02/2017      [name] - [place], [place] [address] Phone: [phone] Fax: [phone]    Your procedure is scheduled on Monday 07/06/2017.  Report to [facility] at [place].M.  Call this number if you have problems the morning of surgery:  [phone]   Remember:   Continue all medications as directed by your physician except follow these medication instructions before surgery below   Do not eat food or drink liquids after midnight.   Take these medicines the morning of surgery with A SIP OF WATER: Finasteride (Proscar) Eye drops if needed Methimazole (Tapazole)  7 days prior to surgery STOP taking any Aleve, Naproxen, Ibuprofen, Motrin, Advil, Goody's, BC's, all herbal medications, fish oil, and all vitamins    Do not wear jewelry, make-up or nail polish.  Do not wear lotions, powders, or colognes, or deodorant.  Men may shave face and neck.  Do not bring valuables to the hospital.  [facility] is not responsible for any belongings or valuables.  Contacts, eyeglasses, dentures or bridgework may not be worn into surgery.  Leave your suitcase in the car.  After surgery it may be brought to your room.  For patients admitted to the hospital, discharge time will be determined by your treatment team.  Patients discharged the day of surgery will not be allowed to drive home.   Name and phone number of your driver:    Special instructions:   [facility]- Preparing For Surgery  Before surgery, you can play an important role. Because skin is not sterile, your skin needs to be as free of germs as possible. You can reduce the number of germs on your skin by washing with CHG (chlorahexidine gluconate) Soap before surgery.  CHG is an antiseptic cleaner which kills germs and bonds with the skin to continue killing germs even after washing.  Please do not use if you have an allergy  to CHG or antibacterial soaps. If your skin becomes reddened/irritated stop using the CHG.  Do not shave (including legs and underarms) for at least 48 hours prior to first CHG shower. It is OK to shave your face.  Please follow these instructions carefully.   1. Shower the NIGHT BEFORE SURGERY and the MORNING OF SURGERY with CHG.   2. If you chose to wash your hair, wash your hair first as usual with your normal shampoo.  3. After you shampoo, rinse your hair and body thoroughly to remove the shampoo.  4. Use CHG as you would any other liquid soap. You can apply CHG directly to the skin and wash gently with a scrungie or a clean washcloth.   5. Apply the CHG Soap to your body ONLY FROM THE NECK DOWN.  Do not use on open wounds or open sores. Avoid contact with your eyes, ears, mouth and genitals (private parts). Wash Face and genitals (private parts)  with your normal soap.  6. Wash thoroughly, paying special attention to the area where your surgery will be performed.  7. Thoroughly rinse your body with warm water from the neck down.  8. DO NOT shower/wash with your normal soap after using and rinsing off the CHG Soap.  9. Pat yourself dry with a CLEAN TOWEL.  10. Wear CLEAN PAJAMAS to bed the night before surgery, wear comfortable clothes the morning of surgery  11. Place CLEAN  SHEETS on your bed the night of your first shower and DO NOT SLEEP WITH PETS.    Day of Surgery: Shower as stated above. Do not apply any deodorants/lotions. Please wear clean clothes to the hospital/surgery center.      Please read over the following fact sheets that you were given.

## 2017-07-03 ENCOUNTER — Other Ambulatory Visit: Payer: Self-pay

## 2017-07-03 ENCOUNTER — Encounter (HOSPITAL_COMMUNITY)
Admission: RE | Admit: 2017-07-03 | Discharge: 2017-07-03 | Disposition: A | Discharge status: Home / Self Care | Payer: Medicare Other | Source: Ambulatory Visit | Attending: Vascular Surgery | Admitting: Vascular Surgery

## 2017-07-03 ENCOUNTER — Encounter: Payer: Medicare Other | Admitting: Vascular Surgery

## 2017-07-03 ENCOUNTER — Encounter (HOSPITAL_COMMUNITY): Payer: Self-pay

## 2017-07-03 DIAGNOSIS — Z8673 Personal history of transient ischemic attack (TIA), and cerebral infarction without residual deficits: Secondary | ICD-10-CM | POA: Diagnosis not present

## 2017-07-03 DIAGNOSIS — E0590 Thyrotoxicosis, unspecified without thyrotoxic crisis or storm: Secondary | ICD-10-CM | POA: Diagnosis not present

## 2017-07-03 DIAGNOSIS — G43119 Migraine with aura, intractable, without status migrainosus: Secondary | ICD-10-CM | POA: Diagnosis not present

## 2017-07-03 DIAGNOSIS — E0500 Thyrotoxicosis with diffuse goiter without thyrotoxic crisis or storm: Secondary | ICD-10-CM | POA: Diagnosis not present

## 2017-07-03 DIAGNOSIS — M19042 Primary osteoarthritis, left hand: Secondary | ICD-10-CM | POA: Diagnosis not present

## 2017-07-03 DIAGNOSIS — Z7982 Long term (current) use of aspirin: Secondary | ICD-10-CM | POA: Diagnosis not present

## 2017-07-03 DIAGNOSIS — Z79899 Other long term (current) drug therapy: Secondary | ICD-10-CM | POA: Diagnosis not present

## 2017-07-03 DIAGNOSIS — I739 Peripheral vascular disease, unspecified: Secondary | ICD-10-CM | POA: Diagnosis not present

## 2017-07-03 DIAGNOSIS — E785 Hyperlipidemia, unspecified: Secondary | ICD-10-CM | POA: Diagnosis not present

## 2017-07-03 DIAGNOSIS — Z86010 Personal history of colonic polyps: Secondary | ICD-10-CM | POA: Diagnosis not present

## 2017-07-03 DIAGNOSIS — M19041 Primary osteoarthritis, right hand: Secondary | ICD-10-CM | POA: Diagnosis not present

## 2017-07-03 DIAGNOSIS — Z888 Allergy status to other drugs, medicaments and biological substances status: Secondary | ICD-10-CM | POA: Diagnosis not present

## 2017-07-03 DIAGNOSIS — N400 Enlarged prostate without lower urinary tract symptoms: Secondary | ICD-10-CM | POA: Diagnosis not present

## 2017-07-03 DIAGNOSIS — I6521 Occlusion and stenosis of right carotid artery: Secondary | ICD-10-CM | POA: Diagnosis not present

## 2017-07-03 DIAGNOSIS — Z77090 Contact with and (suspected) exposure to asbestos: Secondary | ICD-10-CM | POA: Diagnosis not present

## 2017-07-03 HISTORY — DX: Cerebral infarction, unspecified: I63.9

## 2017-07-03 HISTORY — DX: Personal history of urinary calculi: Z87.442

## 2017-07-03 HISTORY — DX: Dyspnea, unspecified: R06.00

## 2017-07-03 LAB — URINALYSIS, ROUTINE W REFLEX MICROSCOPIC
Bilirubin Urine: NEGATIVE
Glucose, UA: NEGATIVE mg/dL
Hgb urine dipstick: NEGATIVE
Ketones, ur: NEGATIVE mg/dL
LEUKOCYTES UA: NEGATIVE
Nitrite: NEGATIVE
PROTEIN: NEGATIVE mg/dL
Specific Gravity, Urine: 1.013 (ref 1.005–1.030)
pH: 5 (ref 5.0–8.0)

## 2017-07-03 LAB — COMPREHENSIVE METABOLIC PANEL
ALK PHOS: 98 U/L (ref 38–126)
ALT: 14 U/L — AB (ref 17–63)
AST: 24 U/L (ref 15–41)
Albumin: 4.3 g/dL (ref 3.5–5.0)
Anion gap: 9 (ref 5–15)
BUN: 19 mg/dL (ref 6–20)
CALCIUM: 9.5 mg/dL (ref 8.9–10.3)
CO2: 24 mmol/L (ref 22–32)
CREATININE: 1.17 mg/dL (ref 0.61–1.24)
Chloride: 106 mmol/L (ref 101–111)
GFR calc non Af Amer: 58 mL/min — ABNORMAL LOW (ref >60)
Glucose, Bld: 100 mg/dL — ABNORMAL HIGH (ref 65–99)
Potassium: 4 mmol/L (ref 3.5–5.1)
SODIUM: 139 mmol/L (ref 135–145)
Total Bilirubin: 0.6 mg/dL (ref 0.3–1.2)
Total Protein: 6.8 g/dL (ref 6.5–8.1)

## 2017-07-03 LAB — CBC
HCT: 45.3 % (ref 39.0–52.0)
HEMOGLOBIN: 15.9 g/dL (ref 13.0–17.0)
MCH: 31.3 pg (ref 26.0–34.0)
MCHC: 35.1 g/dL (ref 30.0–36.0)
MCV: 89.2 fL (ref 78.0–100.0)
Platelets: 293 10*3/uL (ref 150–400)
RBC: 5.08 MIL/uL (ref 4.22–5.81)
RDW: 12.6 % (ref 11.5–15.5)
WBC: 8.5 10*3/uL (ref 4.0–10.5)

## 2017-07-03 LAB — PROTIME-INR
INR: 0.95
Prothrombin Time: 12.6 seconds (ref 11.4–15.2)

## 2017-07-03 LAB — SURGICAL PCR SCREEN
MRSA, PCR: NEGATIVE
Staphylococcus aureus: NEGATIVE

## 2017-07-03 LAB — TYPE AND SCREEN
ABO/RH(D): O POS
Antibody Screen: NEGATIVE

## 2017-07-03 LAB — ABO/RH: ABO/RH(D): O POS

## 2017-07-03 LAB — APTT: aPTT: 35 seconds (ref 24–36)

## 2017-07-03 NOTE — Progress Notes (Signed)
Anesthesia Chart Review:  Pt is a 76 year old male scheduled for R CEA on 07/06/2017 with Jacob Snare, MD  - PCP is Jacob Gainer, MD - Neurologist is Jacob Fast, MD who referred pt for surgery - Endocrinologist is Jacob Heckle, MD (notes in care everywhere)  - Ophthamalogist is Jacob Hailstone, MD (notes in care everywhere)   PMH includes:  CVA (by MRI 05/14/17: acute or subacute punctate infarct in the high right frontal lobe), hyperlipidemia, Graves disease. Never smoker. BMI 28  Medications include: ASA 81mg , lasix, methimazole. Pt to hold ASA day of surgery  BP 137/74   Pulse 69   Temp 36.5 C   Resp 18   Ht 5\' 6"  (1.676 m)   Wt 174 lb 9.6 oz (79.2 kg)   SpO2 97%   BMI 28.18 kg/m   Preoperative labs reviewed. - CBC, UA, PT, PTT acceptable for surgery.  - CMET pending  CT angio neck and head 06/25/17:  1. Predominantly soft plaque at the right carotid bifurcation resulting in critical ICA stenosis. 2. No left-sided carotid artery stenosis. 3. Patent circle of Willis without significant stenosis.  R Carotid duplex 06/24/17:  1. 80-99% R ICA stenosis  B carotid duplex 06/09/17:  - Right Carotid: There is evidence in the right ICA of a 80-99% stenosis. - Left Carotid: There is no evidence of stenosis in the left ICA. - Vertebrals: Both vertebral arteries were patent with antegrade flow. Subclavians: Normal flow hemodynamics were seen in bilateral subclavian arteries  Echo 06/09/17:  - Left ventricle: The cavity size was normal. There was moderate concentric hypertrophy. Systolic function was normal. The estimated ejection fraction was in the range of 60% to 65%. Wall motion was normal; there were no regional wall motion abnormalities. Doppler parameters are consistent with abnormal left ventricular relaxation (grade 1 diastolic dysfunction). Doppler parameters are consistent with elevated ventricular end-diastolic filling pressure. - Aortic valve: Trileaflet;  mildly thickened, mildly calcified leaflets. There was trivial regurgitation. - Mitral valve: There was trivial regurgitation. - Left atrium: The atrium was moderately dilated. - Right ventricle: The cavity size was normal. Wall thickness was normal. Systolic function was normal. - Right atrium: The atrium was normal in size. - Pulmonary arteries: Systolic pressure could not be accurately estimated. - Inferior vena cava: The vessel was normal in size. - Pericardium, extracardiac: There was no pericardial effusion. - Impressions: No cardiac source of emboli was indentified.  If CMET acceptable, I anticipate pt can proceed with surgery as scheduled.   Jacob Cass, FNP-BC [facility]/Anesthesiology Phone: [phone] 07/03/2017 4:23 PM

## 2017-07-03 NOTE — Progress Notes (Addendum)
  PCP is Dr Redge Gainer Denies ever seeing a cardiologist. States that about 2 months ago he was coming home from an MRI and got a call to go to the ED that the MRI shows a stroke this was per Dr Samul Dada (ophthalmology that specializes in Graves disease), He did not go to the ED and had no symptoms of a stroke. Dr Jannifer Franklin is his Neuro Dr and is aware also Dr Donzetta Matters is aware. Pt wife states she watches him closely and even wakes him up at night to have  him smile at her to make sure he is ok.  Echo noted 06-09-17 Larena Sox called and informed.

## 2017-07-05 NOTE — Anesthesia Preprocedure Evaluation (Addendum)
Anesthesia Evaluation  Patient identified by MRN, date of birth, ID band Patient awake    Reviewed: Allergy & Precautions, NPO status , Patient's Chart, lab work & pertinent test results  Airway Mallampati: II  TM Distance: >3 FB Neck ROM: Full    Dental  (+) Dental Advisory Given   Pulmonary neg pulmonary ROS,    breath sounds clear to auscultation       Cardiovascular + Peripheral Vascular Disease   Rhythm:Regular Rate:Normal     Neuro/Psych  Headaches,    GI/Hepatic negative GI ROS, Neg liver ROS,   Endo/Other  Hyperthyroidism (controlled with methimazole)   Renal/GU negative Renal ROS     Musculoskeletal  (+) Arthritis ,   Abdominal   Peds  Hematology negative hematology ROS (+)   Anesthesia Other Findings   Reproductive/Obstetrics                           Lab Results  Component Value Date   WBC 8.5 07/03/2017   HGB 15.9 07/03/2017   HCT 45.3 07/03/2017   MCV 89.2 07/03/2017   PLT 293 07/03/2017   Lab Results  Component Value Date   CREATININE 1.17 07/03/2017   BUN 19 07/03/2017   NA 139 07/03/2017   K 4.0 07/03/2017   CL 106 07/03/2017   CO2 24 07/03/2017   Echo 06/09/17:  - Left ventricle: The cavity size was normal. There was moderateconcentric hypertrophy. Systolic function was normal. Theestimated ejection fraction was in the range of 60% to 65%. Wallmotion was normal; there were no regional wall motionabnormalities. Doppler parameters are consistent with abnormalleft ventricular relaxation (grade 1 diastolic dysfunction). Doppler parameters are consistent with elevated ventricularend-diastolic filling pressure. - Aortic valve: Trileaflet; mildly thickened, mildly calcifiedleaflets. There was trivial regurgitation. - Mitral valve: There was trivial regurgitation. - Left atrium: The atrium was moderately dilated. - Right ventricle: The cavity size was normal. Wall  thickness wasnormal. Systolic function was normal. - Right atrium: The atrium was normal in size. - Pulmonary arteries: Systolic pressure could not be accuratelyestimated. - Inferior vena cava: The vessel was normal in size. - Pericardium, extracardiac: There was no pericardial effusion.   Anesthesia Physical Anesthesia Plan  ASA: III  Anesthesia Plan: General   Post-op Pain Management:    Induction: Intravenous  PONV Risk Score and Plan: 2 and Ondansetron, Dexamethasone and Treatment may vary due to age or medical condition  Airway Management Planned: Oral ETT  Additional Equipment: Arterial line  Intra-op Plan:   Post-operative Plan: Extubation in OR  Informed Consent: I have reviewed the patients History and Physical, chart, labs and discussed the procedure including the risks, benefits and alternatives for the proposed anesthesia with the patient or authorized representative who has indicated his/her understanding and acceptance.   Dental advisory given  Plan Discussed with: CRNA  Anesthesia Plan Comments:        Anesthesia Quick Evaluation

## 2017-07-06 ENCOUNTER — Other Ambulatory Visit: Payer: Self-pay

## 2017-07-06 ENCOUNTER — Encounter (HOSPITAL_COMMUNITY): Payer: Self-pay

## 2017-07-06 ENCOUNTER — Inpatient Hospital Stay (HOSPITAL_COMMUNITY)
Admission: RE | Admit: 2017-07-06 | Discharge: 2017-07-07 | DRG: 039 | Disposition: A | Discharge status: Home / Self Care | Payer: Medicare Other | Source: Ambulatory Visit | Attending: Vascular Surgery | Admitting: Vascular Surgery

## 2017-07-06 ENCOUNTER — Encounter (HOSPITAL_COMMUNITY)
Admission: RE | Disposition: A | Discharge status: Home / Self Care | Payer: Self-pay | Source: Ambulatory Visit | Attending: Vascular Surgery

## 2017-07-06 ENCOUNTER — Inpatient Hospital Stay (HOSPITAL_COMMUNITY): Payer: Medicare Other | Admitting: Anesthesiology

## 2017-07-06 ENCOUNTER — Inpatient Hospital Stay (HOSPITAL_COMMUNITY): Payer: Medicare Other | Admitting: Vascular Surgery

## 2017-07-06 DIAGNOSIS — I6521 Occlusion and stenosis of right carotid artery: Principal | ICD-10-CM | POA: Diagnosis present

## 2017-07-06 DIAGNOSIS — E0500 Thyrotoxicosis with diffuse goiter without thyrotoxic crisis or storm: Secondary | ICD-10-CM | POA: Diagnosis present

## 2017-07-06 DIAGNOSIS — Z7982 Long term (current) use of aspirin: Secondary | ICD-10-CM

## 2017-07-06 DIAGNOSIS — Z86010 Personal history of colonic polyps: Secondary | ICD-10-CM

## 2017-07-06 DIAGNOSIS — M19041 Primary osteoarthritis, right hand: Secondary | ICD-10-CM | POA: Diagnosis present

## 2017-07-06 DIAGNOSIS — Z888 Allergy status to other drugs, medicaments and biological substances status: Secondary | ICD-10-CM

## 2017-07-06 DIAGNOSIS — Z8546 Personal history of malignant neoplasm of prostate: Secondary | ICD-10-CM

## 2017-07-06 DIAGNOSIS — Z79899 Other long term (current) drug therapy: Secondary | ICD-10-CM

## 2017-07-06 DIAGNOSIS — F17220 Nicotine dependence, chewing tobacco, uncomplicated: Secondary | ICD-10-CM | POA: Diagnosis present

## 2017-07-06 DIAGNOSIS — E785 Hyperlipidemia, unspecified: Secondary | ICD-10-CM | POA: Diagnosis present

## 2017-07-06 DIAGNOSIS — N400 Enlarged prostate without lower urinary tract symptoms: Secondary | ICD-10-CM | POA: Diagnosis present

## 2017-07-06 DIAGNOSIS — Z8673 Personal history of transient ischemic attack (TIA), and cerebral infarction without residual deficits: Secondary | ICD-10-CM

## 2017-07-06 DIAGNOSIS — M19042 Primary osteoarthritis, left hand: Secondary | ICD-10-CM | POA: Diagnosis present

## 2017-07-06 DIAGNOSIS — I6529 Occlusion and stenosis of unspecified carotid artery: Secondary | ICD-10-CM | POA: Diagnosis present

## 2017-07-06 DIAGNOSIS — G43119 Migraine with aura, intractable, without status migrainosus: Secondary | ICD-10-CM | POA: Diagnosis present

## 2017-07-06 DIAGNOSIS — Z77090 Contact with and (suspected) exposure to asbestos: Secondary | ICD-10-CM | POA: Diagnosis present

## 2017-07-06 HISTORY — PX: PATCH ANGIOPLASTY: SHX6230

## 2017-07-06 HISTORY — PX: ENDARTERECTOMY: SHX5162

## 2017-07-06 SURGERY — ENDARTERECTOMY, CAROTID
Anesthesia: General | Site: Neck | Laterality: Right

## 2017-07-06 MED ORDER — DEXAMETHASONE SODIUM PHOSPHATE 10 MG/ML IJ SOLN
INTRAMUSCULAR | Status: AC
  Filled 2017-07-06: qty 1

## 2017-07-06 MED ORDER — CHLORHEXIDINE GLUCONATE CLOTH 2 % EX PADS: 6.0000 | MEDICATED_PAD | Freq: Once | CUTANEOUS | Status: DC

## 2017-07-06 MED ORDER — FUROSEMIDE 20 MG PO TABS
20.0000 mg | ORAL_TABLET | Freq: Every day | ORAL | Status: DC
  Administered 2017-07-07: 20 mg via ORAL
  Filled 2017-07-06: qty 1

## 2017-07-06 MED ORDER — PHENYLEPHRINE HCL 10 MG/ML IJ SOLN
INTRAVENOUS | Status: DC | PRN
  Administered 2017-07-06: 20 ug/min via INTRAVENOUS

## 2017-07-06 MED ORDER — ACETAMINOPHEN 650 MG RE SUPP: 325.0000 mg | RECTAL | Status: DC | PRN

## 2017-07-06 MED ORDER — POTASSIUM CHLORIDE CRYS ER 20 MEQ PO TBCR: 20.0000 meq | EXTENDED_RELEASE_TABLET | Freq: Every day | ORAL | Status: DC | PRN

## 2017-07-06 MED ORDER — EPHEDRINE 5 MG/ML INJ
INTRAVENOUS | Status: AC
  Filled 2017-07-06: qty 10

## 2017-07-06 MED ORDER — DEXTROSE 5 % IV SOLN
1.5000 g | INTRAVENOUS | Status: AC
  Administered 2017-07-06: 1.5 g via INTRAVENOUS

## 2017-07-06 MED ORDER — ASPIRIN EC 81 MG PO TBEC
81.0000 mg | DELAYED_RELEASE_TABLET | Freq: Every day | ORAL | Status: DC
  Administered 2017-07-06 – 2017-07-07 (×2): 81 mg via ORAL
  Filled 2017-07-06 (×2): qty 1

## 2017-07-06 MED ORDER — PROMETHAZINE HCL 25 MG/ML IJ SOLN: 6.2500 mg | INTRAMUSCULAR | Status: DC | PRN

## 2017-07-06 MED ORDER — LIDOCAINE 2% (20 MG/ML) 5 ML SYRINGE
INTRAMUSCULAR | Status: AC
  Filled 2017-07-06: qty 5

## 2017-07-06 MED ORDER — DEXTROSE 5 % IV SOLN
INTRAVENOUS | Status: AC
  Filled 2017-07-06: qty 1.5

## 2017-07-06 MED ORDER — ZONISAMIDE 25 MG PO CAPS
50.0000 mg | ORAL_CAPSULE | Freq: Every day | ORAL | Status: DC
  Administered 2017-07-06: 50 mg via ORAL
  Filled 2017-07-06: qty 2

## 2017-07-06 MED ORDER — HEMOSTATIC AGENTS (NO CHARGE) OPTIME
TOPICAL | Status: DC | PRN
  Administered 2017-07-06: 1 via TOPICAL

## 2017-07-06 MED ORDER — OXYCODONE-ACETAMINOPHEN 5-325 MG PO TABS
1.0000 | ORAL_TABLET | ORAL | Status: DC | PRN
  Administered 2017-07-06: 1 via ORAL
  Filled 2017-07-06: qty 2

## 2017-07-06 MED ORDER — FINASTERIDE 5 MG PO TABS
5.0000 mg | ORAL_TABLET | Freq: Every day | ORAL | Status: DC
  Administered 2017-07-07: 5 mg via ORAL
  Filled 2017-07-06: qty 1

## 2017-07-06 MED ORDER — SODIUM CHLORIDE 0.9 % IV SOLN: 500.0000 mL | Freq: Once | INTRAVENOUS | Status: DC | PRN

## 2017-07-06 MED ORDER — SODIUM CHLORIDE 0.9 % IV SOLN
INTRAVENOUS | Status: DC | PRN
  Administered 2017-07-06: 08:00:00 500 mL

## 2017-07-06 MED ORDER — PROTAMINE SULFATE 10 MG/ML IV SOLN
INTRAVENOUS | Status: AC
  Filled 2017-07-06: qty 5

## 2017-07-06 MED ORDER — FENTANYL CITRATE (PF) 100 MCG/2ML IJ SOLN
INTRAMUSCULAR | Status: DC | PRN
  Administered 2017-07-06 (×4): 50 ug via INTRAVENOUS

## 2017-07-06 MED ORDER — ROCURONIUM BROMIDE 100 MG/10ML IV SOLN
INTRAVENOUS | Status: DC | PRN
  Administered 2017-07-06: 40 mg via INTRAVENOUS

## 2017-07-06 MED ORDER — MORPHINE SULFATE (PF) 4 MG/ML IV SOLN: 4.0000 mg | INTRAVENOUS | Status: DC | PRN

## 2017-07-06 MED ORDER — LIDOCAINE HCL (CARDIAC) 20 MG/ML IV SOLN
INTRAVENOUS | Status: DC | PRN
  Administered 2017-07-06: 40 mg via INTRAVENOUS
  Administered 2017-07-06: 60 mg via INTRAVENOUS

## 2017-07-06 MED ORDER — PHENOL 1.4 % MT LIQD: 1.0000 | OROMUCOSAL | Status: DC | PRN

## 2017-07-06 MED ORDER — ROCURONIUM BROMIDE 10 MG/ML (PF) SYRINGE
PREFILLED_SYRINGE | INTRAVENOUS | Status: AC
  Filled 2017-07-06: qty 5

## 2017-07-06 MED ORDER — GUAIFENESIN-DM 100-10 MG/5ML PO SYRP: 15.0000 mL | ORAL_SOLUTION | ORAL | Status: DC | PRN

## 2017-07-06 MED ORDER — SODIUM CHLORIDE 0.9 % IV SOLN
INTRAVENOUS | Status: DC
  Administered 2017-07-06: 75 mL/h via INTRAVENOUS
  Administered 2017-07-07: 03:00:00 via INTRAVENOUS

## 2017-07-06 MED ORDER — LABETALOL HCL 5 MG/ML IV SOLN
INTRAVENOUS | Status: DC | PRN
  Administered 2017-07-06 (×2): 5 mg via INTRAVENOUS

## 2017-07-06 MED ORDER — PROPOFOL 10 MG/ML IV BOLUS
INTRAVENOUS | Status: DC | PRN
  Administered 2017-07-06: 150 mg via INTRAVENOUS
  Administered 2017-07-06: 20 mg via INTRAVENOUS
  Administered 2017-07-06: 10 mg via INTRAVENOUS
  Administered 2017-07-06: 30 mg via INTRAVENOUS

## 2017-07-06 MED ORDER — FENTANYL CITRATE (PF) 250 MCG/5ML IJ SOLN
INTRAMUSCULAR | Status: AC
  Filled 2017-07-06: qty 5

## 2017-07-06 MED ORDER — LIDOCAINE HCL (PF) 1 % IJ SOLN
INTRAMUSCULAR | Status: AC
  Filled 2017-07-06: qty 30

## 2017-07-06 MED ORDER — PROTAMINE SULFATE 10 MG/ML IV SOLN
INTRAVENOUS | Status: DC | PRN
  Administered 2017-07-06: 50 mg via INTRAVENOUS

## 2017-07-06 MED ORDER — PHENYLEPHRINE HCL 10 MG/ML IJ SOLN
INTRAMUSCULAR | Status: DC | PRN
  Administered 2017-07-06: 40 ug via INTRAVENOUS

## 2017-07-06 MED ORDER — LABETALOL HCL 5 MG/ML IV SOLN
INTRAVENOUS | Status: AC
  Filled 2017-07-06: qty 4

## 2017-07-06 MED ORDER — PROPOFOL 10 MG/ML IV BOLUS
INTRAVENOUS | Status: AC
  Filled 2017-07-06: qty 20

## 2017-07-06 MED ORDER — DEXTROSE 5 % IV SOLN
1.5000 g | Freq: Two times a day (BID) | INTRAVENOUS | Status: AC
  Administered 2017-07-06 – 2017-07-07 (×2): 1.5 g via INTRAVENOUS
  Filled 2017-07-06 (×2): qty 1.5

## 2017-07-06 MED ORDER — SUGAMMADEX SODIUM 200 MG/2ML IV SOLN
INTRAVENOUS | Status: DC | PRN
  Administered 2017-07-06: 160 mg via INTRAVENOUS

## 2017-07-06 MED ORDER — METHIMAZOLE 5 MG PO TABS
5.0000 mg | ORAL_TABLET | Freq: Every day | ORAL | Status: DC
  Administered 2017-07-07: 5 mg via ORAL
  Filled 2017-07-06: qty 1

## 2017-07-06 MED ORDER — PHENYLEPHRINE 40 MCG/ML (10ML) SYRINGE FOR IV PUSH (FOR BLOOD PRESSURE SUPPORT)
PREFILLED_SYRINGE | INTRAVENOUS | Status: AC
  Filled 2017-07-06: qty 10

## 2017-07-06 MED ORDER — LABETALOL HCL 5 MG/ML IV SOLN
10.0000 mg | INTRAVENOUS | Status: DC | PRN
  Administered 2017-07-06: 10 mg via INTRAVENOUS

## 2017-07-06 MED ORDER — MAGNESIUM SULFATE 2 GM/50ML IV SOLN
2.0000 g | Freq: Every day | INTRAVENOUS | Status: DC | PRN
  Filled 2017-07-06: qty 50

## 2017-07-06 MED ORDER — SODIUM CHLORIDE 0.9 % IV SOLN: INTRAVENOUS | Status: DC

## 2017-07-06 MED ORDER — ONDANSETRON HCL 4 MG/2ML IJ SOLN: 4.0000 mg | Freq: Four times a day (QID) | INTRAMUSCULAR | Status: DC | PRN

## 2017-07-06 MED ORDER — POLYETHYLENE GLYCOL 3350 17 G PO PACK
17.0000 g | PACK | Freq: Every day | ORAL | Status: DC | PRN
Start: 2017-07-06 — End: 2017-07-07

## 2017-07-06 MED ORDER — ONDANSETRON HCL 4 MG/2ML IJ SOLN
INTRAMUSCULAR | Status: DC | PRN
  Administered 2017-07-06: 4 mg via INTRAVENOUS

## 2017-07-06 MED ORDER — GLYCOPYRROLATE 0.2 MG/ML IJ SOLN
INTRAMUSCULAR | Status: DC | PRN
  Administered 2017-07-06: 0.2 mg via INTRAVENOUS

## 2017-07-06 MED ORDER — HEPARIN SODIUM (PORCINE) 1000 UNIT/ML IJ SOLN
INTRAMUSCULAR | Status: DC | PRN
  Administered 2017-07-06: 8000 [IU] via INTRAVENOUS
  Administered 2017-07-06: 2000 [IU] via INTRAVENOUS

## 2017-07-06 MED ORDER — LACTATED RINGERS IV SOLN
INTRAVENOUS | Status: DC | PRN
  Administered 2017-07-06 (×2): via INTRAVENOUS

## 2017-07-06 MED ORDER — DOCUSATE SODIUM 100 MG PO CAPS
100.0000 mg | ORAL_CAPSULE | Freq: Every day | ORAL | Status: DC
  Filled 2017-07-06: qty 1

## 2017-07-06 MED ORDER — FENTANYL CITRATE (PF) 100 MCG/2ML IJ SOLN
INTRAMUSCULAR | Status: AC
  Filled 2017-07-06: qty 2

## 2017-07-06 MED ORDER — FENTANYL CITRATE (PF) 100 MCG/2ML IJ SOLN
25.0000 ug | INTRAMUSCULAR | Status: DC | PRN
  Administered 2017-07-06: 25 ug via INTRAVENOUS

## 2017-07-06 MED ORDER — PANTOPRAZOLE SODIUM 40 MG PO TBEC
40.0000 mg | DELAYED_RELEASE_TABLET | Freq: Every day | ORAL | Status: DC
Start: 2017-07-06 — End: 2017-07-07
  Administered 2017-07-06 – 2017-07-07 (×2): 40 mg via ORAL
  Filled 2017-07-06 (×2): qty 1

## 2017-07-06 MED ORDER — HEPARIN SODIUM (PORCINE) 1000 UNIT/ML IJ SOLN
INTRAMUSCULAR | Status: AC
  Filled 2017-07-06: qty 1

## 2017-07-06 MED ORDER — ALUM & MAG HYDROXIDE-SIMETH 200-200-20 MG/5ML PO SUSP: 15.0000 mL | ORAL | Status: DC | PRN

## 2017-07-06 MED ORDER — ONDANSETRON HCL 4 MG/2ML IJ SOLN
INTRAMUSCULAR | Status: AC
  Filled 2017-07-06: qty 2

## 2017-07-06 MED ORDER — ACETAMINOPHEN 325 MG PO TABS: 325.0000 mg | ORAL_TABLET | ORAL | Status: DC | PRN

## 2017-07-06 MED ORDER — 0.9 % SODIUM CHLORIDE (POUR BTL) OPTIME
TOPICAL | Status: DC | PRN
  Administered 2017-07-06: 2000 mL

## 2017-07-06 MED ORDER — METOPROLOL TARTRATE 5 MG/5ML IV SOLN: 2.0000 mg | INTRAVENOUS | Status: DC | PRN

## 2017-07-06 MED ORDER — SUGAMMADEX SODIUM 200 MG/2ML IV SOLN
INTRAVENOUS | Status: AC
  Filled 2017-07-06: qty 2

## 2017-07-06 MED ORDER — HYDRALAZINE HCL 20 MG/ML IJ SOLN: 5.0000 mg | INTRAMUSCULAR | Status: DC | PRN

## 2017-07-06 MED ORDER — EPHEDRINE SULFATE 50 MG/ML IJ SOLN
INTRAMUSCULAR | Status: DC | PRN
  Administered 2017-07-06: 5 mg via INTRAVENOUS

## 2017-07-06 SURGICAL SUPPLY — 45 items
CANISTER SUCT 3000ML PPV (MISCELLANEOUS) ×4 IMPLANT
CATH ROBINSON RED A/P 18FR (CATHETERS) ×4 IMPLANT
CLIP VESOCCLUDE MED 24/CT (CLIP) ×4 IMPLANT
CLIP VESOCCLUDE SM WIDE 24/CT (CLIP) ×4 IMPLANT
COVER PROBE W GEL 5X96 (DRAPES) IMPLANT
CRADLE DONUT ADULT HEAD (MISCELLANEOUS) ×4 IMPLANT
DERMABOND ADVANCED (GAUZE/BANDAGES/DRESSINGS) ×2
DERMABOND ADVANCED .7 DNX12 (GAUZE/BANDAGES/DRESSINGS) ×2 IMPLANT
DRAIN CHANNEL 15F RND FF W/TCR (WOUND CARE) IMPLANT
ELECT REM PT RETURN 9FT ADLT (ELECTROSURGICAL) ×4
ELECTRODE REM PT RTRN 9FT ADLT (ELECTROSURGICAL) ×2 IMPLANT
EVACUATOR SILICONE 100CC (DRAIN) IMPLANT
GLOVE BIO SURGEON STRL SZ7.5 (GLOVE) ×4 IMPLANT
GOWN STRL REUS W/ TWL LRG LVL3 (GOWN DISPOSABLE) ×4 IMPLANT
GOWN STRL REUS W/ TWL XL LVL3 (GOWN DISPOSABLE) ×2 IMPLANT
GOWN STRL REUS W/TWL LRG LVL3 (GOWN DISPOSABLE) ×4
GOWN STRL REUS W/TWL XL LVL3 (GOWN DISPOSABLE) ×2
HEMOSTAT SNOW SURGICEL 2X4 (HEMOSTASIS) ×4 IMPLANT
INSERT FOGARTY SM (MISCELLANEOUS) ×4 IMPLANT
IV ADAPTER SYR DOUBLE MALE LL (MISCELLANEOUS) IMPLANT
KIT BASIN OR (CUSTOM PROCEDURE TRAY) ×4 IMPLANT
KIT ROOM TURNOVER OR (KITS) ×4 IMPLANT
KIT SHUNT ARGYLE CAROTID ART 6 (VASCULAR PRODUCTS) IMPLANT
NEEDLE HYPO 25GX1X1/2 BEV (NEEDLE) IMPLANT
NEEDLE SPNL 20GX3.5 QUINCKE YW (NEEDLE) IMPLANT
PACK CAROTID (CUSTOM PROCEDURE TRAY) ×4 IMPLANT
PAD ARMBOARD 7.5X6 YLW CONV (MISCELLANEOUS) ×8 IMPLANT
PATCH VASC XENOSURE 1CMX6CM (Vascular Products) ×2 IMPLANT
PATCH VASC XENOSURE 1X6 (Vascular Products) ×2 IMPLANT
SHUNT CAROTID BYPASS 10 (VASCULAR PRODUCTS) ×4 IMPLANT
SHUNT CAROTID BYPASS 12FRX15.5 (VASCULAR PRODUCTS) IMPLANT
STOPCOCK 4 WAY LG BORE MALE ST (IV SETS) IMPLANT
SUT ETHILON 3 0 PS 1 (SUTURE) IMPLANT
SUT MNCRL AB 4-0 PS2 18 (SUTURE) ×4 IMPLANT
SUT PROLENE 6 0 BV (SUTURE) ×8 IMPLANT
SUT SILK 3 0 (SUTURE)
SUT SILK 3-0 18XBRD TIE 12 (SUTURE) IMPLANT
SUT VIC AB 2-0 CT1 27 (SUTURE) ×2
SUT VIC AB 2-0 CT1 TAPERPNT 27 (SUTURE) ×2 IMPLANT
SUT VIC AB 3-0 SH 27 (SUTURE)
SUT VIC AB 3-0 SH 27X BRD (SUTURE) IMPLANT
SYR CONTROL 10ML LL (SYRINGE) IMPLANT
TOWEL GREEN STERILE (TOWEL DISPOSABLE) ×4 IMPLANT
TUBING ART PRESS 48 MALE/FEM (TUBING) IMPLANT
WATER STERILE IRR 1000ML POUR (IV SOLUTION) ×4 IMPLANT

## 2017-07-06 NOTE — Anesthesia Postprocedure Evaluation (Signed)
Anesthesia Post Note  Patient: Jacob Conner  Procedure(s) Performed: ENDARTERECTOMY CAROTID RIGHT (Right ) PATCH ANGIOPLASTY RIGHT CAROTID ARTERY (Right Neck)     Patient location during evaluation: PACU Anesthesia Type: General Level of consciousness: awake and alert Pain management: pain level controlled Vital Signs Assessment: post-procedure vital signs reviewed and stable Respiratory status: spontaneous breathing, nonlabored ventilation, respiratory function stable and patient connected to nasal cannula oxygen Cardiovascular status: blood pressure returned to baseline and stable Postop Assessment: no apparent nausea or vomiting Anesthetic complications: no    Last Vitals:  Vitals:   07/06/17 1147 07/06/17 1150  BP:    Pulse: 83   Resp:  14  Temp:  36.5 C  SpO2: 96%     Last Pain:  Vitals:   07/06/17 1030  TempSrc:   PainSc: 0-No pain                 Tiajuana Amass

## 2017-07-06 NOTE — Op Note (Signed)
Patient name: Jacob Conner MRN: [id number] DOB: [birth year] Sex: male  07/06/2017 Pre-operative Diagnosis: symptomatic right carotid artery stenosis Post-operative diagnosis:  Same Surgeon:  Erlene Quan C. Donzetta Matters, MD Assistant: Arlee Muslim, PA Procedure Performed: Right carotid endarterectomy with bovine pericardial patch angioplasty   Indications: 76 year old male history of right-sided CVA found to have high-grade stenosis of the right internal carotid artery indicated for intervention.  Findings: There is a nearly occluded internal carotid artery at the takeoff.  This was a high lesion extending above the hypoglossal nerve which was protected throughout its course.  The ansa was divided for further exposure.  Distally our plaque feathered nicely.  Approximately there was residual soft plaque that was tacked with the patch.  We did confirm flow through our shunt but at completion noted that it was not flowing probably for a period of approximately 5 minutes after we had tightened the proximal Rummel tourniquet.   Procedure:  The patient was identified in the holding area and taken to the operating room where he was placed supine on the operating table and general endotracheal anesthesia was induced.  He was sterilely prepped and draped in the right neck chest in the usual fashion given antibiotics and timeout called.  We began with incision along the anterior border of the sternocleidomastoid dissected down until we identified the facial vein.  We then opened proximally and distally to the extent of our incision.  Just deep to the facial vein we identified the hypoglossal nerve and this was protected.  We then divided the facial vein exposed our common carotid artery and placed an umbilical tape around this.  Patient at this time was given 8000 units of heparin which returned an ACT of just under 250 and he was given an additional 2000 units ACT was not rechecked after this.  We then dissected out  our external carotid artery.  We then divided our ansa hypoglossal nerve protecting our hypoglossal itself.  We dissected high up onto the internal carotid artery taking care not to manipulate it.  We placed a red vessel loop around this.  Her ACT had returned.  This time we pulled tension on our red vessel loop clamped our external clamped our common carotid arteries.  These were opened longitudinally were asked encountered significant soft plaque.  We then had prepared a 10 [place] shunt and this was placed into the internal carotid artery and there was significant backbleeding from this.  We then placed it into the common attempt to confirm with Doppler but the Doppler initially did not work.  Switching Dopplers we did confirm flow through the shunt itself and satisfied.  We then began with endarterectomy including eversion of the external.  Distally it did feather nicely after extended work.  We then trimmed a bovine pericardial patch to size and sewed in place with 6-0 Prolene suture.  Prior to completing our anastomosis we removed our shunt.  Upon going to remove the shunt we noted there was very minimal flow through it at this time.  We then removed it we confirmed backbleeding from our internal carotid artery that was quite good.  We then irrigated our carotid bulb completed our anastomosis.  We then unclamped our external carotid artery followed by our internal.  After a few cardiac cycles the internal was released.  There is one area of bleeding in the cephalad area of the patch on the medial aspect and this was repaired with 6-0 Prolene suture.  We then confirmed  flow throughout diastole with our Doppler that there was a high resistance waveform in the external carotid artery.  Satisfied with this the patient was given 50 mg of protamine which he did tolerate.  We irrigated and obtained hemostasis.  Platysma was then closed with 3-0 Vicryl and the skin with 4-0 Monocryl.  Patient was then allowed away from  anesthesia having tolerated the procedure well.  He was noted to be neurologically intact however he did have moderate tongue deviation to the right.  He was then transferred to PACU in stable condition.  EBL: 200cc    Dmoni Fortson C. Donzetta Matters, MD [facility] Office: [phone] Pager: [phone]

## 2017-07-06 NOTE — H&P (Signed)
   History and Physical Update  The patient was interviewed and re-examined.  The patient's previous History and Physical has been reviewed and is unchanged from recent office visit. Plan for right CEA.  Jacob Muhammed C. Donzetta Matters, MD [facility] Office: [phone] Pager: [phone]   07/06/2017, 7:24 AM

## 2017-07-06 NOTE — Transfer of Care (Signed)
Immediate Anesthesia Transfer of Care Note  Patient: Jacob Conner  Procedure(s) Performed: ENDARTERECTOMY CAROTID RIGHT (Right ) PATCH ANGIOPLASTY RIGHT CAROTID ARTERY (Right Neck)  Patient Location: PACU  Anesthesia Type:General  Level of Consciousness: awake, alert , oriented and sedated  Airway & Oxygen Therapy: Patient Spontanous Breathing and Patient connected to nasal cannula oxygen  Post-op Assessment: Report given to RN, Post -op Vital signs reviewed and stable and Patient moving all extremities  Post vital signs: Reviewed and stable  Last Vitals:  Vitals:   07/06/17 0553 07/06/17 1011  BP: (!) 158/80   Pulse:    Resp:    Temp:  (P) 36.7 C  SpO2: 99%     Last Pain:  Vitals:   07/06/17 0552  TempSrc: Oral      Patients Stated Pain Goal: 3 (22/44/97 5300)  Complications: No apparent anesthesia complications

## 2017-07-06 NOTE — Progress Notes (Signed)
Patient arrived from PACU. Patient had Rt. Carotid Endarterectomy with pericardial path angioplasty. Patient VS 133/77 heart rate 95 Resp 15 and 97% on 2L [place]. Patient is Alert and Oriented X4. Patient denies any pain and is resting comfortably in bed.

## 2017-07-06 NOTE — Anesthesia Procedure Notes (Signed)
Procedure Name: Intubation Date/Time: 07/06/2017 7:41 AM Performed by: Scheryl Darter, CRNA Pre-anesthesia Checklist: Patient identified, Emergency Drugs available, Suction available, Patient being monitored and Timeout performed Patient Re-evaluated:Patient Re-evaluated prior to induction Oxygen Delivery Method: Circle system utilized Preoxygenation: Pre-oxygenation with 100% oxygen Induction Type: IV induction Laryngoscope Size: Miller and 3 Grade View: Grade II Tube type: Oral Tube size: 7.5 mm Number of attempts: 1 Airway Equipment and Method: Stylet Placement Confirmation: ETT inserted through vocal cords under direct vision,  positive ETCO2 and breath sounds checked- equal and bilateral Secured at: 22 cm Tube secured with: Tape Dental Injury: Teeth and Oropharynx as per pre-operative assessment

## 2017-07-07 ENCOUNTER — Encounter (HOSPITAL_COMMUNITY): Payer: Self-pay | Admitting: Vascular Surgery

## 2017-07-07 DIAGNOSIS — Z888 Allergy status to other drugs, medicaments and biological substances status: Secondary | ICD-10-CM | POA: Diagnosis not present

## 2017-07-07 DIAGNOSIS — N400 Enlarged prostate without lower urinary tract symptoms: Secondary | ICD-10-CM | POA: Diagnosis present

## 2017-07-07 DIAGNOSIS — Z79899 Other long term (current) drug therapy: Secondary | ICD-10-CM | POA: Diagnosis not present

## 2017-07-07 DIAGNOSIS — Z86010 Personal history of colonic polyps: Secondary | ICD-10-CM | POA: Diagnosis not present

## 2017-07-07 DIAGNOSIS — M19041 Primary osteoarthritis, right hand: Secondary | ICD-10-CM | POA: Diagnosis present

## 2017-07-07 DIAGNOSIS — F17220 Nicotine dependence, chewing tobacco, uncomplicated: Secondary | ICD-10-CM | POA: Diagnosis present

## 2017-07-07 DIAGNOSIS — E0500 Thyrotoxicosis with diffuse goiter without thyrotoxic crisis or storm: Secondary | ICD-10-CM | POA: Diagnosis present

## 2017-07-07 DIAGNOSIS — E785 Hyperlipidemia, unspecified: Secondary | ICD-10-CM | POA: Diagnosis present

## 2017-07-07 DIAGNOSIS — Z77090 Contact with and (suspected) exposure to asbestos: Secondary | ICD-10-CM | POA: Diagnosis present

## 2017-07-07 DIAGNOSIS — G43119 Migraine with aura, intractable, without status migrainosus: Secondary | ICD-10-CM | POA: Diagnosis present

## 2017-07-07 DIAGNOSIS — Z8673 Personal history of transient ischemic attack (TIA), and cerebral infarction without residual deficits: Secondary | ICD-10-CM | POA: Diagnosis not present

## 2017-07-07 DIAGNOSIS — Z7982 Long term (current) use of aspirin: Secondary | ICD-10-CM | POA: Diagnosis not present

## 2017-07-07 DIAGNOSIS — M19042 Primary osteoarthritis, left hand: Secondary | ICD-10-CM | POA: Diagnosis present

## 2017-07-07 DIAGNOSIS — Z8546 Personal history of malignant neoplasm of prostate: Secondary | ICD-10-CM | POA: Diagnosis not present

## 2017-07-07 DIAGNOSIS — I6521 Occlusion and stenosis of right carotid artery: Secondary | ICD-10-CM | POA: Diagnosis present

## 2017-07-07 LAB — BASIC METABOLIC PANEL
Anion gap: 8 (ref 5–15)
BUN: 16 mg/dL (ref 6–20)
CALCIUM: 8.6 mg/dL — AB (ref 8.9–10.3)
CHLORIDE: 105 mmol/L (ref 101–111)
CO2: 22 mmol/L (ref 22–32)
CREATININE: 1.02 mg/dL (ref 0.61–1.24)
GFR calc Af Amer: >60 mL/min (ref >60)
GFR calc non Af Amer: >60 mL/min (ref >60)
GLUCOSE: 131 mg/dL — AB (ref 65–99)
Potassium: 3.6 mmol/L (ref 3.5–5.1)
Sodium: 135 mmol/L (ref 135–145)

## 2017-07-07 LAB — CBC
HEMATOCRIT: 38.2 % — AB (ref 39.0–52.0)
Hemoglobin: 12.8 g/dL — ABNORMAL LOW (ref 13.0–17.0)
MCH: 30.5 pg (ref 26.0–34.0)
MCHC: 33.5 g/dL (ref 30.0–36.0)
MCV: 91 fL (ref 78.0–100.0)
Platelets: 259 10*3/uL (ref 150–400)
RBC: 4.2 MIL/uL — AB (ref 4.22–5.81)
RDW: 13.1 % (ref 11.5–15.5)
WBC: 10.4 10*3/uL (ref 4.0–10.5)

## 2017-07-07 MED ORDER — OXYCODONE-ACETAMINOPHEN 5-325 MG PO TABS: 1.0000 | ORAL_TABLET | Freq: Four times a day (QID) | ORAL | 0 refills | Status: DC | PRN

## 2017-07-07 NOTE — Discharge Summary (Signed)
Discharge Summary     Jacob Conner 02-21-1941 76 y.o. male  [id number]  Admission Date: 07/06/2017  Discharge Date: 07/07/17  Physician: Thomes Lolling*  Admission Diagnosis: right carotid stenosis  Discharge Day services:   See progress note 12/18 Physical Exam: Vitals:   07/07/17 0100 07/07/17 0423  BP:  135/75  Pulse:  73  Resp: 13 14  Temp:  98.4 F (36.9 C)  SpO2: 98% 98%   General: NAD Cardiac: Regular rate and rhythm Lungs: Clear to auscultation bilateral lung fields Incisions: Right neck incision with skin edges approximated well no palpable hematoma no bleeding; trachea midline Extremities: Moving all extremities well Abdomen:  soft Neurologic: Tongue deviation to surgery side; mild swallow defect with thin liquid   Hospital Course:  The patient was admitted to the hospital and taken to the operating room on 07/06/2017 and underwent right carotid endarterectomy.  The pt tolerated the procedure well and was transported to the PACU in fair condition.   By POD 1, the pt neuro status remained stable.  Patient experienced tongue deviation to surgical side.  This was likely product of the carotid lesion being anatomically high in nature with close proximity to hypoglossal nerve. Speech was consulted for evaluation and recommendations included avoiding thin liquids over the next several days.  No further therapy was recommended. Patient is eating a regular diet, ambulating without difficulty, and feeling fit for discharge.  The remainder of the hospital course consisted of increasing mobilization and increasing intake of solids without difficulty.   Recent Labs    07/07/17 0346  NA 135  K 3.6  CL 105  CO2 22  GLUCOSE 131*  BUN 16  CALCIUM 8.6*   Recent Labs    07/07/17 0346  WBC 10.4  HGB 12.8*  HCT 38.2*  PLT 259   No results for input(s): INR in the last 72 hours.     Discharge Diagnosis:  right carotid stenosis  Secondary  Diagnosis: Patient Active Problem List   Diagnosis Date Noted  . Carotid artery stenosis 07/06/2017  . 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 Graves disease 10/12/2015  . 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 Hyperthyroidism 06/08/2013  . BPH (benign prostatic hyperplasia) 04/19/2013  . Hyperlipidemia 04/19/2013  . 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 Spermatocele 04/19/2013  . Hx of colonic polyp 04/19/2013  . History of migraine headaches 04/19/2013   Past Medical History:  Diagnosis Date  . Allergy   . Arthritis   . BPH (benign prostatic hyperplasia)   . 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 Dyspnea   . Graves disease   . History of kidney stones   . Hyperlipidemia   . Prostate disorder   . Stroke ([place])   . Thyroid disease    Hyperthyroidism  and Graves    Allergies as of 07/07/2017      Reactions   Topiramate Other (See Comments)   Cognitive clouding Cognitive clouding      Medication List    TAKE these medications   aspirin EC 81 MG tablet Take 81 mg by mouth daily.   finasteride 5 MG tablet Commonly known as:  PROSCAR TAKE 1 TABLET ONCE DAILY.   furosemide 20 MG tablet Commonly known as:  LASIX TAKE (1) TABLET BY MOUTH ONCE DAILY. What changed:  See the new instructions.   hydroxypropyl methylcellulose / hypromellose 2.5 % ophthalmic solution Commonly known as:  ISOPTO TEARS /  GONIOVISC Place 2 drops into both eyes as needed for dry eyes (IRRITATION).   methimazole 5 MG tablet Commonly known as:  TAPAZOLE Take 5 mg by mouth daily.   oxyCODONE-acetaminophen 5-325 MG tablet Commonly known as:  PERCOCET/ROXICET Take 1 tablet by mouth every 6 (six) hours as needed for moderate pain.   Vitamin D (Ergocalciferol) 50000 units Caps capsule Commonly known as:  DRISDOL TAKE 1 CAPSULE BY MOUTH ONCE A WEEK.   zonisamide 25 MG capsule Commonly known as:  ZONEGRAN TAKE 1  CAPSULE AT BEDTIME 1 WEEK, THEN TAKE 2 CAPSULES AT BEDTIME 1 WEEK, THEN TAKE 3 CAPSULES AT BEDTIME. What changed:  See the new instructions.        Discharge Instructions:   [facility] Discharge Instructions Carotid Endarterectomy (CEA)  Please refer to the following instructions for your post-procedure care. Your surgeon or physician assistant will discuss any changes with you.  Activity  You are encouraged to walk as much as you can. You can slowly return to normal activities but must avoid strenuous activity and heavy lifting until your doctor tell you it's OK. Avoid activities such as vacuuming or swinging a golf club. You can drive after one week if you are comfortable and you are no longer taking prescription pain medications. It is normal to feel tired for serval weeks after your surgery. It is also normal to have difficulty with sleep habits, eating, and bowel movements after surgery. These will go away with time.  Bathing/Showering  You may shower after you come home. Do not soak in a bathtub, hot tub, or swim until the incision heals completely.  Incision Care  Shower every day. Clean your incision with mild soap and water. Pat the area dry with a clean towel. You do not need a bandage unless otherwise instructed. Do not apply any ointments or creams to your incision. You may have skin glue on your incision. Do not peel it off. It will come off on its own in about one week. Your incision may feel thickened and raised for several weeks after your surgery. This is normal and the skin will soften over time. For Men Only: It's OK to shave around the incision but do not shave the incision itself for 2 weeks. It is common to have numbness under your chin that could last for several months.  Diet  Resume your normal diet. There are no special food restrictions following this procedure. A low fat/low cholesterol diet is recommended for all patients with  vascular disease. In order to heal from your surgery, it is CRITICAL to get adequate nutrition. Your body requires vitamins, minerals, and protein. Vegetables are the best source of vitamins and minerals. Vegetables also provide the perfect balance of protein. Processed food has little nutritional value, so try to avoid this.  Medications  Resume taking all of your medications unless your doctor or physician assistant tells you not to.  If your incision is causing pain, you may take over-the- counter pain relievers such as acetaminophen (Tylenol). If you were prescribed a stronger pain medication, please be aware these medications can cause nausea and constipation.  Prevent nausea by taking the medication with a snack or meal. Avoid constipation by drinking plenty of fluids and eating foods with a high amount of fiber, such as fruits, vegetables, and grains. Do not take Tylenol if you are taking prescription pain medications.  Follow Up  Our office will schedule a follow up appointment 2-3  weeks following discharge.  Please call us immediately for any of the following conditions  Increased pain, redness, drainage (pus) from your incision site. Fever of 101 degrees or higher. If you should develop stroke (slurred speech, difficulty swallowing, weakness on one side of your body, loss of vision) you should call 911 and go to the nearest emergency room.  Reduce your risk of vascular disease:  Stop smoking. If you would like help call [facility] at 1-800-QUIT-NOW 715-888-1182) or [place] at (216)423-3344. Manage your cholesterol Maintain a desired weight Control your diabetes Keep your blood pressure down  If you have any questions, please call the office at [phone].  Prescriptions given: Percocet 5/325 mg #8  Disposition: Home  Patient's condition: is Fair  Follow up: 1. Dr. Donzetta Matters in 2 weeks.   Dagoberto Ligas, PA-C [facility] [phone]   --- For  [facility] use ---    IV medication needed for:  1. Hypertension: No 2. Hypotension: No  Post-op Complications: Yes  1. Post-op CVA or TIA: No  If yes: Event classification (right eye, left eye, right cortical, left cortical, verterobasilar, other): Tongue deviation to surgical side  If yes: Timing of event (intra-op, <6 hrs post-op, >=6 hrs post-op, unknown): Unknown  2. CN injury: No  3. Myocardial infarction: No  If yes: Dx by (EKG or clinical, Troponin):   4.  CHF: No  5.  Dysrhythmia (new): No  6. Wound infection: No  7. Reperfusion symptoms: No  8. Return to OR: No  If yes: return to OR for (bleeding, neurologic, other CEA incision, other):   Discharge medications: Statin use:  No ASA use:  Yes   Beta blocker use:  Yes ACE-Inhibitor use:  No  ARB use:  No CCB use: No P2Y12 Antagonist use: No, [ ]  Plavix, [ ]  Plasugrel, [ ]  Ticlopinine, [ ]  Ticagrelor, [ ]  Other, [ ]  No for medical reason, [ ]  Non-compliant, [ ]  Not-indicated Anti-coagulant use:  No, [ ]  Warfarin, [ ]  Rivaroxaban, [ ]  Dabigatran,

## 2017-07-07 NOTE — Progress Notes (Signed)
  Progress Note    07/07/2017 6:40 AM 1 Day Post-Op  Subjective:  Some difficulty with swallowing  Vitals:   07/07/17 0100 07/07/17 0423  BP:  135/75  Pulse:  73  Resp: 13 14  Temp:  98.4 F (36.9 C)  SpO2: 98% 98%    Physical Exam: aaox3 Moving all 4 extremities Tongue deviates mildly to right Neck incision on right cdi  CBC    Component Value Date/Time   WBC 10.4 07/07/2017 0346   RBC 4.20 (L) 07/07/2017 0346   HGB 12.8 (L) 07/07/2017 0346   HGB 14.5 10/20/2016 0803   HCT 38.2 (L) 07/07/2017 0346   HCT 41.9 10/20/2016 0803   PLT 259 07/07/2017 0346   PLT 260 10/20/2016 0803   MCV 91.0 07/07/2017 0346   MCV 88 10/20/2016 0803   MCH 30.5 07/07/2017 0346   MCHC 33.5 07/07/2017 0346   RDW 13.1 07/07/2017 0346   RDW 13.7 10/20/2016 0803   LYMPHSABS 1.7 10/20/2016 0803   EOSABS 0.2 10/20/2016 0803   BASOSABS 0.0 10/20/2016 0803    BMET    Component Value Date/Time   NA 135 07/07/2017 0346   NA 141 10/20/2016 0803   K 3.6 07/07/2017 0346   CL 105 07/07/2017 0346   CO2 22 07/07/2017 0346   GLUCOSE 131 (H) 07/07/2017 0346   BUN 16 07/07/2017 0346   BUN 16 10/20/2016 0803   CREATININE 1.02 07/07/2017 0346   CALCIUM 8.6 (L) 07/07/2017 0346   GFRNONAA >60 07/07/2017 0346   GFRAA >60 07/07/2017 0346    INR    Component Value Date/Time   INR 0.95 07/03/2017 1422     Intake/Output Summary (Last 24 hours) at 07/07/2017 0640 Last data filed at 07/06/2017 2243 Gross per 24 hour  Intake 1431.25 ml  Output 600 ml  Net 831.25 ml     Assessment:  76 y.o. male is s/p right cea, tongue is deviating with some dysphagia  Plan: Speech therapy today dispo pending their recs aspirin  Jacob Nicolls C. Donzetta Matters, MD [facility] [place] Office: [phone] Pager: [phone]  07/07/2017 6:40 AM

## 2017-07-07 NOTE — Progress Notes (Signed)
Patient is ready for discharge. Patient has all belongings and understands all discharge instructions. Patient is alert and oriented and VS are stable. IV has been DC'd and patient has been taken off of Telemetry. Patient will be transported home by his son, Roselyn Reef and will leave floor by wheelchair to meet son at entrance.

## 2017-07-07 NOTE — Care Management Note (Signed)
Case Management Note Marvetta Gibbons RN, BSN Unit 4E-Case Manager [phone]  Patient Details  Name: Jacob Conner MRN: [id number] Date of Birth: [birth year]  Subjective/Objective:  Pt admitted s/p right cea                Action/Plan: PTA pt lived at home with spouse- anticipate return home- for swallow eval today- CM to follow for transition to recovery needs.   Expected Discharge Date:                  Expected Discharge Plan:  Home/Self Care  In-House Referral:  NA  Discharge planning Services  CM Consult  Post Acute Care Choice:    Choice offered to:     DME Arranged:    DME Agency:     HH Arranged:    HH Agency:     Status of Service:  In process, will continue to follow  If discussed at Long Length of Stay Meetings, dates discussed:    Discharge Disposition:   Additional Comments:  Dawayne Patricia, RN 07/07/2017, 10:30 AM

## 2017-07-07 NOTE — Evaluation (Signed)
Clinical/Bedside Swallow Evaluation Patient Details  Name: Jacob Conner MRN: [id number] Date of Birth: 1940/09/16  Today's Date: 07/07/2017 Time: SLP Start Time (ACUTE ONLY): 1143 SLP Stop Time (ACUTE ONLY): 1158 SLP Time Calculation (min) (ACUTE ONLY): 15 min  Past Medical History:  Past Medical History:  Diagnosis Date  . Allergy   . Arthritis   . BPH (benign prostatic hyperplasia)   . 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 Dyspnea   . Graves disease   . History of kidney stones   . Hyperlipidemia   . Prostate disorder   . Stroke ([place])   . Thyroid disease    Hyperthyroidism  and Graves   Past Surgical History:  Past Surgical History:  Procedure Laterality Date  . COLONOSCOPY    . ESOPHAGOGASTRODUODENOSCOPY    . HYDROCELE EXCISION    . 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 CTA revealed predominantly soft plaque at the right carotid bifurcation resulting in critical ICA stenosis. Pt underwent CEA 1/17 and experienced lingual deviation and dysphagia.    Assessment / Plan / Recommendation Clinical Impression  Pt reports "getting strangled when I take big bites/sips and don't take my time" and more difficulty with liquids than solids. Increased volume and/or velocity resulted in immediate suspected airway intrusion with straw sip thin (immediate cough). Single, cup sips of thin and small bites appeared consistently safe. Pt denies pharyngeal globus sensation. Pt is cognitively intact and reliable and following precautions to "avoid getting strangled." Educated about foods to use caution with, pills with puree texture (he states he still chews them); continue regular texture as tolerated, thin liquids, avoid straws for a week and slowly reintegrate if desired. No follow up needed.      SLP Visit Diagnosis: Dysphagia, unspecified (R13.10)     Aspiration Risk  Mild aspiration risk;Moderate aspiration risk    Diet Recommendation Regular;Thin liquid   Liquid Administration via: Cup;No straw Medication Administration: Whole meds with puree Supervision: Patient able to self feed Compensations: Slow rate;Small sips/bites;Minimize environmental distractions Postural Changes: Seated upright at 90 degrees    Other  Recommendations Oral Care Recommendations: Oral care BID   Follow up Recommendations None      Frequency and Duration            Prognosis        Swallow Study   General HPI: Jacob Conner a 76 y.o.malewith 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 CTA revealed predominantly soft plaque at the right carotid bifurcation resulting in critical ICA stenosis. Pt underwent CEA 1/17 and experienced lingual deviation and dysphagia.  Type of Study: Bedside Swallow Evaluation Previous Swallow Assessment: (none) Diet Prior to this Study: Regular;Thin liquids Temperature Spikes Noted: No Respiratory Status: Nasal cannula History of Recent Intubation: Yes Length of Intubations (days): (only during surgery) Date extubated: 07/06/17 Behavior/Cognition: Alert;Cooperative;Pleasant mood Oral Cavity Assessment: Dry Oral Care Completed by SLP: No Oral Cavity - Dentition: Adequate natural dentition Vision: Functional for self-feeding Self-Feeding Abilities: Able to feed self Patient Positioning: Upright in bed Baseline Vocal Quality: Normal Volitional Cough: Strong Volitional Swallow: Able to elicit    Oral/Motor/Sensory Function Overall Oral Motor/Sensory Function: Mild impairment Facial ROM: Within Functional Limits Facial Symmetry: Within Functional Limits Facial Strength: Within Functional Limits Facial Sensation: Within Functional Limits Lingual ROM: Reduced left;Reduced right Lingual Symmetry:  Abnormal symmetry right;Suspected CN XII (hypoglossal)  dysfunction Lingual Strength: Within Functional Limits Lingual Sensation: Within Functional Limits Velum: Within Functional Limits Mandible: Within Functional Limits   Ice Chips Ice chips: Not tested   Thin Liquid Thin Liquid: Impaired Presentation: Cup;Straw Pharyngeal  Phase Impairments: Cough - Immediate    Nectar Thick Nectar Thick Liquid: Not tested   Honey Thick Honey Thick Liquid: Not tested   Puree Puree: Within functional limits   Solid   GO   Solid: Within functional limits        Mick Sell, Orbie Pyo 07/07/2017,12:15 PM Orbie Pyo [place].[employer] [phone]

## 2017-07-07 NOTE — Discharge Instructions (Signed)
   [facility] [place]  Discharge Instructions   Carotid Endarterectomy (CEA)  Please refer to the following instructions for your post-procedure care. Your surgeon or physician assistant will discuss any changes with you.  Activity  You are encouraged to walk as much as you can. You can slowly return to normal activities but must avoid strenuous activity and heavy lifting until your doctor tell you it's OK. Avoid activities such as vacuuming or swinging a golf club. You can drive after one week if you are comfortable and you are no longer taking prescription pain medications. It is normal to feel tired for serval weeks after your surgery. It is also normal to have difficulty with sleep habits, eating, and bowel movements after surgery. These will go away with time.  Bathing/Showering  You may shower after you come home. Do not soak in a bathtub, hot tub, or swim until the incision heals completely.  Incision Care  Shower every day. Clean your incision with mild soap and water. Pat the area dry with a clean towel. You do not need a bandage unless otherwise instructed. Do not apply any ointments or creams to your incision. You may have skin glue on your incision. Do not peel it off. It will come off on its own in about one week. Your incision may feel thickened and raised for several weeks after your surgery. This is normal and the skin will soften over time. For Men Only: It's OK to shave around the incision but do not shave the incision itself for 2 weeks. It is common to have numbness under your chin that could last for several months.  Diet  Resume your normal diet. There are no special food restrictions following this procedure. A low fat/low cholesterol diet is recommended for all patients with vascular disease. In order to heal from your surgery, it is CRITICAL to get adequate nutrition. Your body requires vitamins, minerals, and protein. Vegetables are the best  source of vitamins and minerals. Vegetables also provide the perfect balance of protein. Processed food has little nutritional value, so try to avoid this.        Medications  Resume taking all of your medications unless your doctor or physician assistant tells you not to. If your incision is causing pain, you may take over-the- counter pain relievers such as acetaminophen (Tylenol). If you were prescribed a stronger pain medication, please be aware these medications can cause nausea and constipation. Prevent nausea by taking the medication with a snack or meal. Avoid constipation by drinking plenty of fluids and eating foods with a high amount of fiber, such as fruits, vegetables, and grains. Do not take Tylenol if you are taking prescription pain medications.  Follow Up  Our office will schedule a follow up appointment 2-3 weeks following discharge.  Please call us immediately for any of the following conditions  Increased pain, redness, drainage (pus) from your incision site. Fever of 101 degrees or higher. If you should develop stroke (slurred speech, difficulty swallowing, weakness on one side of your body, loss of vision) you should call 911 and go to the nearest emergency room.  Reduce your risk of vascular disease:  Stop smoking. If you would like help call [facility] at 1-800-QUIT-NOW (1-800-784-8669) or [place] at 336-586-4000. Manage your cholesterol Maintain a desired weight Control your diabetes Keep your blood pressure down  If you have any questions, please call the office at [phone].   

## 2017-07-08 ENCOUNTER — Telehealth: Payer: Self-pay | Admitting: Vascular Surgery

## 2017-07-08 LAB — POCT ACTIVATED CLOTTING TIME: ACTIVATED CLOTTING TIME: 241 s

## 2017-07-08 NOTE — Telephone Encounter (Signed)
-----   Message from Mena Goes, RN sent at 07/07/2017  4:42 PM EST ----- Regarding: 2-3 weeks post CEA   ----- Message ----- From: Iline Oven Sent: 07/07/2017   1:36 PM To: Vvs Charge Pool  Can you schedule this pt for appt with Dr. Donzetta Matters in about 2-3 weeks.  PO R carotid endarterectomy. Thanks, [facility]

## 2017-07-08 NOTE — Telephone Encounter (Signed)
Sched appt 07/31/17 at 12:00. Spoke to pt's wife.

## 2017-07-10 NOTE — Consult Note (Signed)
           [facility] CM Primary Care Navigator  07/10/2017  Jacob Conner 02-Nov-1940 [id number]   Attempt to seepatient at the bedside to identify possible discharge needs but he was alreadydischargedper staff report.  Patient was discharged home.  Primary care provider's officeis listed asprovidingtransition of care (TOC).  Patient has discharge instruction to follow-up withDr. Donzetta Matters (vascular surgery) in 2 weeks.    For questions, please contact:  Dannielle Huh, BSN, RN- [facility] Primary Care Navigator  Telephone: [phone] [place]

## 2017-07-15 ENCOUNTER — Encounter (HOSPITAL_COMMUNITY): Payer: Medicare Other

## 2017-07-15 ENCOUNTER — Encounter: Payer: Medicare Other | Admitting: Surgery

## 2017-07-20 NOTE — Telephone Encounter (Signed)
Error

## 2017-07-21 HISTORY — PX: COLONOSCOPY: SHX174

## 2017-07-31 ENCOUNTER — Ambulatory Visit (INDEPENDENT_AMBULATORY_CARE_PROVIDER_SITE_OTHER): Payer: Medicare Other | Admitting: Vascular Surgery

## 2017-07-31 ENCOUNTER — Encounter: Payer: Self-pay | Admitting: Vascular Surgery

## 2017-07-31 VITALS — BP 122/77 | HR 73 | Temp 97.5°F | Resp 18 | Ht 66.0 in | Wt 174.0 lb

## 2017-07-31 DIAGNOSIS — I6521 Occlusion and stenosis of right carotid artery: Secondary | ICD-10-CM

## 2017-07-31 NOTE — Progress Notes (Signed)
Subjective:     Patient ID: Jacob Conner, male   DOB: [birth year], 77 y.o.   MRN: [id number]  HPI 77 year old male follows up after right-sided carotid endarterectomy for symptomatic disease.  He has had no further symptoms since this time.  He does have a complaint of still difficulty moving his tongue although it is getting better.  He is also had difficulty with high notes while singing in church.  He is able to swallow after seeing a speech therapist in the hospital but has had to quit chewing tobacco given that he cannot spit adequately.   Review of Systems Difficulty with high notes, cannot spit, speech is different    Objective:   Physical Exam aaox3 Tongue with minimal deviation right Grip strength 5/5 bilaterally    Assessment/Plan     77 year old male recently underwent right-sided carotid endarterectomy for symptomatic disease.  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  I am not really concerned about his ability to chew tobacco but it is concerning that he cannot spit although it does seem to be improving.  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  During the operation I did visualize the hypoglossal given that it was a high lesion he is likely just has mild injury to that nerve.  Superior laryngeal nerve is a different story as this much smaller nerve was not directly visualized.  Hopefully he will have complete resolution of all his symptoms when we see him again in 6 months with carotid duplex.  If he has persistent difficulty I have offered him to see speech therapy or ENT for further evaluation but he does not want that at this time.  We will otherwise see him in 6 months with repeat carotid  duplex.  Buckley Bradly C. Donzetta Matters, MD [facility] [place] Office: [phone] Pager: [phone]

## 2017-08-03 NOTE — Addendum Note (Signed)
Addended by: Lianne Cure A on: 08/03/2017 02:08 PM   Modules accepted: Orders

## 2017-08-06 ENCOUNTER — Encounter: Payer: Self-pay | Admitting: Adult Health

## 2017-08-06 ENCOUNTER — Ambulatory Visit: Payer: Medicare Other | Admitting: Adult Health

## 2017-08-06 VITALS — BP 136/81 | HR 69 | Ht 66.0 in | Wt 175.8 lb

## 2017-08-06 DIAGNOSIS — Z8673 Personal history of transient ischemic attack (TIA), and cerebral infarction without residual deficits: Secondary | ICD-10-CM

## 2017-08-06 DIAGNOSIS — L821 Other seborrheic keratosis: Secondary | ICD-10-CM | POA: Diagnosis not present

## 2017-08-06 DIAGNOSIS — G43009 Migraine without aura, not intractable, without status migrainosus: Secondary | ICD-10-CM

## 2017-08-06 DIAGNOSIS — D229 Melanocytic nevi, unspecified: Secondary | ICD-10-CM | POA: Diagnosis not present

## 2017-08-06 DIAGNOSIS — L814 Other melanin hyperpigmentation: Secondary | ICD-10-CM | POA: Diagnosis not present

## 2017-08-06 DIAGNOSIS — C44619 Basal cell carcinoma of skin of left upper limb, including shoulder: Secondary | ICD-10-CM | POA: Diagnosis not present

## 2017-08-06 DIAGNOSIS — D1801 Hemangioma of skin and subcutaneous tissue: Secondary | ICD-10-CM | POA: Diagnosis not present

## 2017-08-06 NOTE — Patient Instructions (Signed)
Your Plan:  Decrease Zonegran to 50 mg for 2-3 weeks and if no headaches then can reduce to 25 mg for 2-3 weeks and if no headaches can stop the medication.   Thank you for coming to see [place] at [facility]. I hope we have been able to provide you high quality care today.  You may receive a patient satisfaction survey over the next few weeks. We would appreciate your feedback and comments so that we may continue to improve ourselves and the health of our patients.

## 2017-08-06 NOTE — Progress Notes (Signed)
PATIENT: Jacob Conner DOB: April 09, 1941  REASON FOR VISIT: follow up HISTORY FROM: patient  HISTORY OF PRESENT ILLNESS: Today 08/06/17 Jacob Conner is a 77 year old male with a history of migraine headaches.  He returns today for follow-up.  Since last visit he had an MRI that revealed an old stroke.  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  He saw Dr. Donzetta Matters who completed a right endarterectomy.  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  He states that he is able to chew and swallow normally.  He states that since the procedure he has not had any headaches.  He would like to wean off of Zonegran if possible.  HISTORY  Jacob Conner is a 77 year old male with a history of migraine headaches. He returns today for follow-up. He was originally placed on Topamax however he was unable to tolerate this medication. He was switched to Alvan. He reports that this medication seems to be working well. He did have two headaches this past week. He reports that his headaches always consists of changes in his vision. He states that he will have lightning bolts that start on the right periphery and shoot across to the left periphery. He states if he is able to lay down these typically resolve within an hour. He reports that a headache never follows. He just recently worked up to 75 mg. Reports he's been taking this for about a week. He reports that he is tolerating the medication well. He returns today for an evaluation.   REVIEW OF SYSTEMS: Out of a complete 14 system review of symptoms, the patient complains only of the following symptoms, and all other reviewed systems are negative.  Trouble swallowing, cough, eye redness, light sensitivity, speech difficulty, joint pain  ALLERGIES: Allergies  Allergen Reactions  . Topiramate Other (See Comments)   Cognitive clouding Cognitive clouding    HOME MEDICATIONS: Outpatient Medications Prior to Visit  Medication Sig Dispense Refill  . aspirin EC 81 MG tablet Take 81 mg by mouth daily.    . finasteride (PROSCAR) 5 MG tablet TAKE 1 TABLET ONCE DAILY. 30 tablet 0  . furosemide (LASIX) 20 MG tablet TAKE (1) TABLET BY MOUTH ONCE DAILY. (Patient taking differently: TAKE (1) TABLET BY MOUTH EVERY OTHER DAY) 30 tablet 0  . hydroxypropyl methylcellulose / hypromellose (ISOPTO TEARS / GONIOVISC) 2.5 % ophthalmic solution Place 2 drops into both eyes as needed for dry eyes (IRRITATION).    Marland Kitchen methimazole (TAPAZOLE) 5 MG tablet Take 5 mg by mouth daily.     Marland Kitchen oxyCODONE-acetaminophen (PERCOCET/ROXICET) 5-325 MG tablet Take 1 tablet by mouth every 6 (six) hours as needed for moderate pain. 8 tablet 0  . Vitamin D, Ergocalciferol, (DRISDOL) 50000 units CAPS capsule TAKE 1 CAPSULE BY MOUTH ONCE A WEEK. 4 capsule 0  . zonisamide (ZONEGRAN) 25 MG capsule TAKE 1 CAPSULE AT BEDTIME 1 WEEK, THEN TAKE 2 CAPSULES AT BEDTIME 1 WEEK, THEN TAKE 3 CAPSULES AT BEDTIME. (Patient taking differently: TAKE 3 CAPSULES AT BEDTIME.) 90 capsule 5   No facility-administered medications prior to visit.     PAST MEDICAL HISTORY: Past Medical History:  Diagnosis Date  . Allergy   . Arthritis   . BPH (benign prostatic hyperplasia)   . 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 Dyspnea   .  Graves disease   . History of kidney stones   . Hyperlipidemia   . Prostate disorder   . Stroke ([place])   . Thyroid disease    Hyperthyroidism  and Graves    PAST SURGICAL HISTORY: Past Surgical History:  Procedure Laterality Date  . COLONOSCOPY    . ENDARTERECTOMY Right 07/06/2017   Procedure: ENDARTERECTOMY CAROTID RIGHT;  Surgeon: Waynetta Sandy, MD;  Location: [facility];  Service: Vascular;  Laterality: Right;  . ESOPHAGOGASTRODUODENOSCOPY    . HYDROCELE EXCISION    . LITHOTRIPSY    . PATCH ANGIOPLASTY  Right 07/06/2017   Procedure: PATCH ANGIOPLASTY RIGHT CAROTID ARTERY;  Surgeon: Waynetta Sandy, MD;  [facility];  Service: Vascular;  Laterality: Right;    FAMILY HISTORY: Family History  Problem Relation Age of Onset  . Liver disease Mother     SOCIAL HISTORY: Social History   Socioeconomic History  . Marital status: Married    Spouse name: Butch Penny  . Number of children: Not on file  . Years of education: Not on file  . Highest education level: Not on file  Social Needs  . Financial resource strain: Not on file  . Food insecurity - worry: Not on file  . Food insecurity - inability: Not on file  . Transportation needs - medical: Not on file  . Transportation needs - non-medical: Not on file  Occupational History  . Not on file  Tobacco Use  . Smoking status: Never Smoker  . Smokeless tobacco: Current User    Types: Chew  . Tobacco comment: chew 1 pack per day since he was 77 years old  Substance and Sexual Activity  . Alcohol use: No  . Drug use: No  . Sexual activity: Not on file  Other Topics Concern  . Not on file  Social History Narrative   Lives with wife   Caffeine use: 1 cup coffee per day, 1 soda per day   Right-handed      PHYSICAL EXAM  Vitals:   08/06/17 1310  BP: 136/81  Pulse: 69  Weight: 175 lb 12.8 oz (79.7 kg)  Height: 5\' 6"  (1.676 m)   Body mass index is 28.37 kg/m.  Generalized: Well developed, in no acute distress   Neurological examination  Mentation: Alert oriented to time, place, history taking. Follows all commands speech and language fluent Cranial nerve II-XII: Pupils were equal round reactive to light. Extraocular movements were full, visual field were full on confrontational test. Facial sensation and strength were normal. Uvula tongue midline. Head turning and shoulder shrug  were normal and symmetric. Motor: The motor testing reveals 5 over 5 strength of all 4 extremities. Good symmetric motor tone is noted  throughout.  Sensory: Sensory testing is intact to soft touch on all 4 extremities. No evidence of extinction is noted.  Coordination: Cerebellar testing reveals good finger-nose-finger and heel-to-shin bilaterally.  Gait and station: Gait is normal.  Reflexes: Deep tendon reflexes are symmetric and normal bilaterally.   DIAGNOSTIC DATA (LABS, IMAGING, TESTING) - I reviewed patient records, labs, notes, testing and imaging myself where available.  Lab Results  Component Value Date   WBC 10.4 07/07/2017   HGB 12.8 (L) 07/07/2017   HCT 38.2 (L) 07/07/2017   MCV 91.0 07/07/2017   PLT 259 07/07/2017      Component Value Date/Time   NA 135 07/07/2017 0346   NA 141 10/20/2016 0803   K 3.6 07/07/2017 0346   CL 105 07/07/2017 0346  CO2 22 07/07/2017 0346   GLUCOSE 131 (H) 07/07/2017 0346   BUN 16 07/07/2017 0346   BUN 16 10/20/2016 0803   CREATININE 1.02 07/07/2017 0346   CALCIUM 8.6 (L) 07/07/2017 0346   PROT 6.8 07/03/2017 1422   PROT 6.2 10/20/2016 0803   ALBUMIN 4.3 07/03/2017 1422   ALBUMIN 4.2 10/20/2016 0803   AST 24 07/03/2017 1422   ALT 14 (L) 07/03/2017 1422   ALKPHOS 98 07/03/2017 1422   BILITOT 0.6 07/03/2017 1422   BILITOT 0.3 10/20/2016 0803   GFRNONAA >60 07/07/2017 0346   GFRAA >60 07/07/2017 0346   Lab Results  Component Value Date   CHOL 236 (H) 10/20/2016   HDL 36 (L) 10/20/2016   LDLCALC 173 (H) 10/20/2016   TRIG 137 10/20/2016   CHOLHDL 6.6 (H) 10/20/2016   No results found for: HGBA1C No results found for: VITAMINB12 Lab Results  Component Value Date   TSH 2.050 10/20/2016      ASSESSMENT AND PLAN 77 y.o. year old male  has a past medical history of Allergy, Arthritis, BPH (benign prostatic hyperplasia), Classical migraine with intractable migraine (11/10/2016), Dyspnea, Graves disease, History of kidney stones, Hyperlipidemia, Prostate disorder, Stroke ([place]), and Thyroid disease. here with :  1.  Migraine headache 2.  History of  stroke  Overall the patient is doing well.  He would like to wean off of Zonegran if possible.  Advised that he should reduce his dose to 50 mg for 2-3 weeks if his headaches do not return he can then reduce his dose to 25 mg for 2-3 weeks.  If he continues to not have any headaches he can discontinue the medication.  He is advised that if his symptoms worsen or he develops new symptoms he should let us know.  He will follow-up in 6 months or sooner if needed.  I spent 15 minutes with the patient. 50% of this time was spent discussing his medication.    Ward Givens, MSN, NP-C 08/06/2017, 1:19 PM [facility] [address] [place], [place] [address] [phone]

## 2017-08-06 NOTE — Progress Notes (Signed)
I have read the note, and I agree with the clinical assessment and plan.  Jacob Conner   

## 2017-08-18 ENCOUNTER — Encounter: Payer: Medicare Other | Admitting: Vascular Surgery

## 2017-08-18 ENCOUNTER — Encounter (HOSPITAL_COMMUNITY): Payer: Medicare Other

## 2017-09-05 ENCOUNTER — Other Ambulatory Visit: Payer: Self-pay | Admitting: Family Medicine

## 2017-09-07 NOTE — Telephone Encounter (Signed)
Last seen 10/14/16  DWM  Last Vit D 10/20/16  39.3

## 2017-09-22 DIAGNOSIS — E0500 Thyrotoxicosis with diffuse goiter without thyrotoxic crisis or storm: Secondary | ICD-10-CM | POA: Diagnosis not present

## 2017-10-02 ENCOUNTER — Other Ambulatory Visit: Payer: Self-pay | Admitting: Nurse Practitioner

## 2017-10-02 ENCOUNTER — Other Ambulatory Visit: Payer: Self-pay | Admitting: Family Medicine

## 2017-10-13 ENCOUNTER — Encounter: Payer: Self-pay | Admitting: *Deleted

## 2017-10-13 DIAGNOSIS — Z006 Encounter for examination for normal comparison and control in clinical research program: Secondary | ICD-10-CM

## 2017-10-13 NOTE — Progress Notes (Signed)
Late entry:    Patient to Research clinic for screening in the Clear Research Study.  All elements of the informed consent form, study requirements and expectations were reviewed with the subject. All questions and concerns were identified and addressed prior to the signing of the consent.  No procedures were performed prior to consenting the subject. The subject was given an adequate amount of time to make an informed decision. A copy of the consent was provided to the patient to take home.  Jake Bathe, RN 10/09/17 1005

## 2017-10-16 ENCOUNTER — Encounter: Payer: Self-pay | Admitting: *Deleted

## 2017-10-16 ENCOUNTER — Other Ambulatory Visit: Payer: Self-pay | Admitting: *Deleted

## 2017-10-16 DIAGNOSIS — Z006 Encounter for examination for normal comparison and control in clinical research program: Secondary | ICD-10-CM

## 2017-10-16 MED ORDER — AMBULATORY NON FORMULARY MEDICATION: 180.0000 mg | Freq: Every day | Status: DC

## 2017-10-16 NOTE — Progress Notes (Signed)
Late entry: Subject to research clinic on October 14, 2017 for visit S2-W4 in the Clear research study. No c/o, aes or saes to report.  Subject started run in phase of the study. Next appointment scheduled.

## 2017-10-23 ENCOUNTER — Ambulatory Visit (INDEPENDENT_AMBULATORY_CARE_PROVIDER_SITE_OTHER): Payer: Medicare Other | Admitting: Family Medicine

## 2017-10-23 ENCOUNTER — Encounter: Payer: Self-pay | Admitting: Family Medicine

## 2017-10-23 VITALS — BP 114/61 | HR 63 | Temp 97.1°F | Ht 66.0 in | Wt 174.0 lb

## 2017-10-23 DIAGNOSIS — Z0000 Encounter for general adult medical examination without abnormal findings: Secondary | ICD-10-CM | POA: Diagnosis not present

## 2017-10-23 DIAGNOSIS — M150 Primary generalized (osteo)arthritis: Secondary | ICD-10-CM

## 2017-10-23 DIAGNOSIS — E0500 Thyrotoxicosis with diffuse goiter without thyrotoxic crisis or storm: Secondary | ICD-10-CM

## 2017-10-23 DIAGNOSIS — I700 Atherosclerosis of aorta: Secondary | ICD-10-CM

## 2017-10-23 DIAGNOSIS — E559 Vitamin D deficiency, unspecified: Secondary | ICD-10-CM

## 2017-10-23 DIAGNOSIS — E0590 Thyrotoxicosis, unspecified without thyrotoxic crisis or storm: Secondary | ICD-10-CM

## 2017-10-23 DIAGNOSIS — N400 Enlarged prostate without lower urinary tract symptoms: Secondary | ICD-10-CM

## 2017-10-23 DIAGNOSIS — R0989 Other specified symptoms and signs involving the circulatory and respiratory systems: Secondary | ICD-10-CM

## 2017-10-23 DIAGNOSIS — Z800 Family history of malignant neoplasm of digestive organs: Secondary | ICD-10-CM

## 2017-10-23 DIAGNOSIS — M25561 Pain in right knee: Secondary | ICD-10-CM

## 2017-10-23 DIAGNOSIS — M159 Polyosteoarthritis, unspecified: Secondary | ICD-10-CM

## 2017-10-23 DIAGNOSIS — E7800 Pure hypercholesterolemia, unspecified: Secondary | ICD-10-CM

## 2017-10-23 DIAGNOSIS — M545 Low back pain: Secondary | ICD-10-CM

## 2017-10-23 DIAGNOSIS — I739 Peripheral vascular disease, unspecified: Secondary | ICD-10-CM

## 2017-10-23 DIAGNOSIS — Z79899 Other long term (current) drug therapy: Secondary | ICD-10-CM | POA: Diagnosis not present

## 2017-10-23 LAB — URINALYSIS, COMPLETE
BILIRUBIN UA: NEGATIVE
Glucose, UA: NEGATIVE
KETONES UA: NEGATIVE
Leukocytes, UA: NEGATIVE
NITRITE UA: NEGATIVE
RBC UA: NEGATIVE
SPEC GRAV UA: 1.02 (ref 1.005–1.030)
Urobilinogen, Ur: 1 mg/dL (ref 0.2–1.0)
pH, UA: 8 — ABNORMAL HIGH (ref 5.0–7.5)

## 2017-10-23 LAB — MICROSCOPIC EXAMINATION
Bacteria, UA: NONE SEEN
EPITHELIAL CELLS (NON RENAL): NONE SEEN /HPF (ref 0–10)
RBC MICROSCOPIC, UA: NONE SEEN /HPF (ref 0–2)
Renal Epithel, UA: NONE SEEN /hpf

## 2017-10-23 NOTE — Progress Notes (Signed)
Subjective:    Patient ID: Jacob Conner, male    DOB: [birth year], 77 y.o.   MRN: [id number]  HPI  Patient is here today for annual wellness exam and follow up of chronic medical problems which includes hyperthyroid and hyperlipidemia. He is taking medication regularly.  The patient is doing well overall other than ongoing low back pain.  He does have Graves' disease.  He has been followed by the endocrinologist and the ophthalmologist at [facility].  He wants to wait on getting his Tdap.  The patient has had lab work done.  This will be reviewed briefly with the patient during the visit today.  The CBC was within normal limits.  The renal panel and electrolytes were all good.  Cholesterol numbers with traditional lipid testing had an LDL C that was elevated at 188 and a good cholesterol that was good at 44.  Triglycerides are good at 130.  The A1c was good at 5.7 and the TSH was within normal limits at 3.31.  The patient is doing well overall but his biggest complaints have to do with his back pain and his end-stage right knee problems.  He has not seen the orthopedic surgeon about the knee and we will arrange for him to have that visit.  The lab work was reviewed with him.  He is an a study regarding his cholesterol issues and will talk more with his wife about this.  It is a 3-year study.  His father had prostate cancer and rectal cancer.  The patient is interested in doing a PSA and we will get that done in addition to the blood work that has already been done.  We will also do a referral to the orthopedic specialist to look at the knee.  He denies any chest pain or shortness of breath.  He denies any trouble with his stomach including nausea vomiting diarrhea blood in the stool or black tarry bowel movements.  He is passing his water without problems.  His last colonoscopy was in January 2011 and based on his father's history of rectal cancer he is still interested in  doing a repeat colonoscopy.    Patient Active Problem List   Diagnosis Date Noted  . Carotid artery stenosis 07/06/2017  . 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 Graves disease 10/12/2015  . 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 Hyperthyroidism 06/08/2013  . BPH (benign prostatic hyperplasia) 04/19/2013  . Hyperlipidemia 04/19/2013  . 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 Spermatocele 04/19/2013  . Hx of colonic polyp 04/19/2013  . History of migraine headaches 04/19/2013   Outpatient Encounter Medications as of 10/23/2017  Medication Sig  . AMBULATORY NON FORMULARY MEDICATION Take 180 mg by mouth daily. Medication Name: bempedoic acid 180 mgs vs a placebo, study drug provided  . aspirin EC 81 MG tablet Take 81 mg by mouth daily.  . finasteride (PROSCAR) 5 MG tablet TAKE 1 TABLET ONCE DAILY.  . furosemide (LASIX) 20 MG tablet TAKE (1) TABLET BY MOUTH ONCE DAILY.  . hydroxypropyl methylcellulose / hypromellose (ISOPTO TEARS / GONIOVISC) 2.5 % ophthalmic solution Place 2 drops into both eyes as needed for dry eyes (IRRITATION).  Marland Kitchen methimazole (TAPAZOLE) 5 MG tablet Take 5 mg by mouth daily.   . Vitamin D, Ergocalciferol, (DRISDOL) 50000 units CAPS capsule TAKE 1 CAPSULE BY MOUTH ONCE A WEEK.  . [DISCONTINUED] oxyCODONE-acetaminophen (PERCOCET/ROXICET) 5-325 MG tablet Take 1  tablet by mouth every 6 (six) hours as needed for moderate pain.  . [DISCONTINUED] zonisamide (ZONEGRAN) 25 MG capsule TAKE 1 CAPSULE AT BEDTIME 1 WEEK, THEN TAKE 2 CAPSULES AT BEDTIME 1 WEEK, THEN TAKE 3 CAPSULES AT BEDTIME. (Patient taking differently: TAKE 3 CAPSULES AT BEDTIME.)   No facility-administered encounter medications on file as of 10/23/2017.       Review of Systems  Constitutional: Negative.   HENT: Negative.   Eyes: Negative.   Respiratory: Negative.   Cardiovascular: Negative.   Gastrointestinal: Negative.   Endocrine: Negative.     Genitourinary: Negative.   Musculoskeletal: Positive for back pain (low ).  Skin: Negative.   Allergic/Immunologic: Negative.   Neurological: Negative.   Hematological: Negative.   Psychiatric/Behavioral: Negative.        Objective:   Physical Exam  Constitutional: He is oriented to person, place, and time. He appears well-developed and well-nourished. No distress.  The patient is pleasant relaxed and calm.  His biggest issues are his back and knee pain.  HENT:  Head: Normocephalic and atraumatic.  Right Ear: External ear normal.  Left Ear: External ear normal.  Nose: Nose normal.  Mouth/Throat: Oropharynx is clear and moist. No oropharyngeal exudate.  Eyes: Pupils are equal, round, and reactive to light. Conjunctivae and EOM are normal. Right eye exhibits no discharge. Left eye exhibits no discharge. No scleral icterus.  Neck: Normal range of motion. Neck supple. No thyromegaly present.  No bruits or thyroid abnormality detected today.  Cardiovascular: Normal rate, regular rhythm and normal heart sounds.  No murmur heard. The heart is regular at 72/min diminished pulses right lower extremity  Pulmonary/Chest: Effort normal and breath sounds normal. No respiratory distress. He has no wheezes. He has no rales. He exhibits no tenderness.  Clear anteriorly and posteriorly no axillary adenopathy and no chest wall masses  Abdominal: Soft. Bowel sounds are normal. He exhibits no mass. There is no tenderness. There is no rebound and no guarding.  No liver or spleen enlargement no masses no bruits and no inguinal adenopathy  Genitourinary: Rectum normal and penis normal.  Genitourinary Comments: The prostate is slightly enlarged and the external genitalia were within normal limits.  No inguinal hernias were palpable.  No rectal masses.  Musculoskeletal: Normal range of motion. He exhibits tenderness. He exhibits no edema.  Patient is somewhat kyphotic in posture.  He has grating at both  the joints.  Right is worse than the left.  Both joint lines are tender especially at the right knee.  Lymphadenopathy:    He has no cervical adenopathy.  Neurological: He is alert and oriented to person, place, and time. He has normal reflexes. No cranial nerve deficit.  Skin: Skin is warm and dry. No rash noted.  Psychiatric: He has a normal mood and affect. His behavior is normal. Judgment and thought content normal.  Nursing note and vitals reviewed.   BP 114/61 (BP Location: Left Arm)   Pulse 63   Temp (!) 97.1 F (36.2 C) (Oral)   Ht 5\' 6"  (1.676 m)   Wt 174 lb (78.9 kg)   BMI 28.08 kg/m        Assessment & Plan:  1. Annual physical exam -Patient is doing well overall especially with his ongoing problems of Graves' disease and vision. - PSA, total and free - Urinalysis, Complete  2. Hyperthyroidism -Continue to follow-up with endocrinologist  3. Pure hypercholesterolemia -Continue aggressive therapeutic lifestyle changes and current treatment per study  4.  Vitamin D deficiency -Continue current treatment pending results of lab work  5. Benign prostatic hyperplasia, unspecified whether lower urinary tract symptoms present -Patient is having no symptoms and prostate gland is only slightly enlarged.  Patient's father did have prostate cancer and patient is interested in knowing his PSA. - PSA, total and free - Urinalysis, Complete  6. Decreased pulses in feet -Decreased pedal pulses right foot  7. Family history of colon cancer -Father developed rectal cancer in his 42s and patient is interested in further evaluation of this  8. Aortic arch atherosclerosis ([place]) -Continue aggressive therapeutic lifestyle changes and current treatment as provided in study.  9. Graves disease -Follow-up at [facility] with endocrinologist and ophthalmologist  10. Peripheral vascular insufficiency (H[facility]) -This is an ongoing problem and patient has recently had surgery on  his right neck for carotid stenosis and does have ongoing problems with circulation in his right lower extremity.  11. Primary osteoarthritis involving multiple joints -He is especially concerned with arthritis in his knee and low back.  We will arrange for him to see the orthopedic surgeon for further evaluation.   Patient Instructions                       Medicare Annual Wellness Visit  [facility] and the medical providers at [facility] strive to bring you the best medical care.  In doing so we not only want to address your current medical conditions and concerns but also to detect new conditions early and prevent illness, disease and health-related problems.    Medicare offers a yearly Wellness Visit which allows our clinical staff to assess your need for preventative services including immunizations, lifestyle education, counseling to decrease risk of preventable diseases and screening for fall risk and other medical concerns.    This visit is provided free of charge (no copay) for all Medicare recipients. The clinical pharmacists at [facility] have begun to conduct these Wellness Visits which will also include a thorough review of all your medications.    As you primary medical provider recommend that you make an appointment for your Annual Wellness Visit if you have not done so already this year.  You may set up this appointment before you leave today or you may call back ([phone]) and schedule an appointment.  Please make sure when you call that you mention that you are scheduling your Annual Wellness Visit with the clinical pharmacist so that the appointment may be made for the proper length of time.     Continue current medications. Continue good therapeutic lifestyle changes which include good diet and exercise. Fall precautions discussed with patient. If an FOBT was given today- please return it to our front desk. If you are over 75  years old - you may need Prevnar 3 or the adult Pneumonia vaccine.  **Flu shots are available--- please call and schedule a FLU-CLINIC appointment**  After your visit with [place] today you will receive a survey in the mail or online from [employer] regarding your care with [place]. Please take a moment to fill this out. Your feedback is very important to [place] as you can help [place] better understand your patient needs as well as improve your experience and satisfaction. WE CARE ABOUT YOU!!!   We will arrange for you to have a visit with orthopedic surgeon to look at your right knee We will call with your PSA result as soon as it  becomes available We will schedule you for a visit to see the gastroenterologist because of the family history of rectal carcinoma.  The patient agrees with this.  Arrie Senate MD

## 2017-10-23 NOTE — Patient Instructions (Addendum)
Medicare Annual Wellness Visit  [facility] and the medical providers at [facility] strive to bring you the best medical care.  In doing so we not only want to address your current medical conditions and concerns but also to detect new conditions early and prevent illness, disease and health-related problems.    Medicare offers a yearly Wellness Visit which allows our clinical staff to assess your need for preventative services including immunizations, lifestyle education, counseling to decrease risk of preventable diseases and screening for fall risk and other medical concerns.    This visit is provided free of charge (no copay) for all Medicare recipients. The clinical pharmacists at [facility] have begun to conduct these Wellness Visits which will also include a thorough review of all your medications.    As you primary medical provider recommend that you make an appointment for your Annual Wellness Visit if you have not done so already this year.  You may set up this appointment before you leave today or you may call back ([phone]) and schedule an appointment.  Please make sure when you call that you mention that you are scheduling your Annual Wellness Visit with the clinical pharmacist so that the appointment may be made for the proper length of time.     Continue current medications. Continue good therapeutic lifestyle changes which include good diet and exercise. Fall precautions discussed with patient. If an FOBT was given today- please return it to our front desk. If you are over 52 years old - you may need Prevnar 35 or the adult Pneumonia vaccine.  **Flu shots are available--- please call and schedule a FLU-CLINIC appointment**  After your visit with [place] today you will receive a survey in the mail or online from [employer] regarding your care with [place]. Please take a moment to fill this out. Your feedback is very  important to [place] as you can help [place] better understand your patient needs as well as improve your experience and satisfaction. WE CARE ABOUT YOU!!!   We will arrange for you to have a visit with orthopedic surgeon to look at your right knee We will call with your PSA result as soon as it becomes available We will schedule you for a visit to see the gastroenterologist because of the family history of rectal carcinoma.  The patient agrees with this.

## 2017-10-23 NOTE — Addendum Note (Signed)
Addended by: Zannie Cove on: 10/23/2017 12:51 PM   Modules accepted: Orders

## 2017-10-24 LAB — PSA, TOTAL AND FREE
PROSTATE SPECIFIC AG, SERUM: 1.1 ng/mL (ref 0.0–4.0)
PSA FREE PCT: 26.4 %
PSA FREE: 0.29 ng/mL

## 2017-10-26 ENCOUNTER — Encounter: Payer: Self-pay | Admitting: Internal Medicine

## 2017-10-26 ENCOUNTER — Telehealth: Payer: Self-pay | Admitting: *Deleted

## 2017-10-26 NOTE — Telephone Encounter (Signed)
I have spoken to patient and his wife, Butch Penny (at request of patient) to schedule him for procedure. Colonoscopy has been scheduled for Monday, 12/07/17 at 2:00 pm and previsit is scheduled for 11/18/17 at 4:00 pm. They both verbalize understanding of time/date/location of procedure and previsit.

## 2017-10-26 NOTE — Telephone Encounter (Signed)
-----   Message from Jerene Bears, MD sent at 10/26/2017  9:14 AM EDT ----- With family hx and age of 89 and last colon in 2011, repeating screening colon make sense Dottie, Please facilitate screening colon JMP  ----- Message ----- From: Zannie Cove, LPN Sent: 01/23/8114  72:62 PM To: Jerene Bears, MD  Hello! Dr Redge Gainer wanted me to message you about one of our pts that was here this morning. He had his last colon in 2011 and they are wanting another - he has seen Dr Amedeo Plenty in the past and he states in a note 2018 that he doesn't need anymore. His father had Prostate and Rectal cancer. Dr [name] and the pt would like to have one more and would like you to do it - if you approve.  As always - thank for helping [place] out!  Georgina Pillion, LPN

## 2017-10-29 ENCOUNTER — Other Ambulatory Visit: Payer: Self-pay | Admitting: Nurse Practitioner

## 2017-10-29 NOTE — Telephone Encounter (Signed)
DWM PCP   Last Vit D 10/20/17  39.3

## 2017-10-31 LAB — SPECIMEN STATUS REPORT

## 2017-10-31 LAB — VITAMIN D 25 HYDROXY (VIT D DEFICIENCY, FRACTURES): VIT D 25 HYDROXY: 34.5 ng/mL (ref 30.0–100.0)

## 2017-11-11 ENCOUNTER — Encounter: Payer: Self-pay | Admitting: *Deleted

## 2017-11-11 DIAGNOSIS — Z006 Encounter for examination for normal comparison and control in clinical research program: Secondary | ICD-10-CM

## 2017-11-11 NOTE — Progress Notes (Signed)
Subject to research clinic for visit T1-W0 in the Clear research study.  No c/o, aes or saes to report.  Subject 97% compliant with run in meds and randomized into the trial with new drug dispensed.  Next appointment scheduled.  Re-consented to [facility] [place] version 6.0.

## 2017-11-12 DIAGNOSIS — M545 Low back pain: Secondary | ICD-10-CM | POA: Diagnosis not present

## 2017-11-21 ENCOUNTER — Other Ambulatory Visit: Payer: Self-pay | Admitting: Nurse Practitioner

## 2017-11-21 ENCOUNTER — Other Ambulatory Visit: Payer: Self-pay | Admitting: Family Medicine

## 2017-11-23 NOTE — Telephone Encounter (Signed)
Last Vit d 10/23/17  34.5

## 2017-11-26 ENCOUNTER — Ambulatory Visit (AMBULATORY_SURGERY_CENTER): Payer: Self-pay

## 2017-11-26 VITALS — Ht 65.0 in | Wt 172.6 lb

## 2017-11-26 DIAGNOSIS — Z800 Family history of malignant neoplasm of digestive organs: Secondary | ICD-10-CM

## 2017-11-26 MED ORDER — NA SULFATE-K SULFATE-MG SULF 17.5-3.13-1.6 GM/177ML PO SOLN
1.0000 | Freq: Once | ORAL | 0 refills | Status: AC
Start: 2017-11-26 — End: 2017-11-26

## 2017-11-26 NOTE — Progress Notes (Signed)
Per pt, no allergies to soy or egg products.Pt not taking any weight loss meds or using  O2 at home.  Emmi video sent to patient's email 

## 2017-11-27 ENCOUNTER — Encounter: Payer: Self-pay | Admitting: Internal Medicine

## 2017-12-07 ENCOUNTER — Other Ambulatory Visit: Payer: Self-pay

## 2017-12-07 ENCOUNTER — Encounter: Payer: Self-pay | Admitting: Internal Medicine

## 2017-12-07 ENCOUNTER — Ambulatory Visit (AMBULATORY_SURGERY_CENTER): Payer: Medicare Other | Admitting: Internal Medicine

## 2017-12-07 VITALS — BP 116/67 | HR 75 | Temp 98.0°F | Resp 9 | Ht 65.0 in | Wt 172.0 lb

## 2017-12-07 DIAGNOSIS — Z1211 Encounter for screening for malignant neoplasm of colon: Secondary | ICD-10-CM

## 2017-12-07 DIAGNOSIS — D123 Benign neoplasm of transverse colon: Secondary | ICD-10-CM

## 2017-12-07 DIAGNOSIS — Z86010 Personal history of colonic polyps: Secondary | ICD-10-CM | POA: Diagnosis not present

## 2017-12-07 DIAGNOSIS — D122 Benign neoplasm of ascending colon: Secondary | ICD-10-CM | POA: Diagnosis not present

## 2017-12-07 DIAGNOSIS — Z800 Family history of malignant neoplasm of digestive organs: Secondary | ICD-10-CM | POA: Diagnosis not present

## 2017-12-07 NOTE — Op Note (Signed)
[facility] Patient Name: Jacob Conner Procedure Date: 12/07/2017 4:00 PM MRN: [id number] Endoscopist: Jerene Bears , MD Age: 77 Referring MD:  Date of Birth: May 05, 1941 Gender: Male Account #: [id number] Procedure:                Colonoscopy Indications:              Screening in patient at increased risk: Family                            history of 1st-degree relative with colorectal                            cancer Medicines:                Monitored Anesthesia Care Procedure:                Pre-Anesthesia Assessment:                           - Prior to the procedure, a History and Physical                            was performed, and patient medications and                            allergies were reviewed. The patient's tolerance of                            previous anesthesia was also reviewed. The risks                            and benefits of the procedure and the sedation                            options and risks were discussed with the patient.                            All questions were answered, and informed consent                            was obtained. Prior Anticoagulants: The patient has                            taken no previous anticoagulant or antiplatelet                            agents. ASA Grade Assessment: III - A patient with                            severe systemic disease. After reviewing the risks                            and benefits, the patient was deemed in  satisfactory condition to undergo the procedure.                           After obtaining informed consent, the colonoscope                            was passed under direct vision. Throughout the                            procedure, the patient's blood pressure, pulse, and                            oxygen saturations were monitored continuously. The                            Colonoscope was introduced through the anus and                 advanced to the cecum, identified by appendiceal                            orifice and ileocecal valve. The colonoscopy was                            performed without difficulty. The patient tolerated                            the procedure well. The quality of the bowel                            preparation was good. Scope In: 4:15:59 PM Scope Out: 4:29:37 PM Scope Withdrawal Time: 0 hours 9 minutes 5 seconds  Total Procedure Duration: 0 hours 13 minutes 38 seconds  Findings:                 The digital rectal exam was normal.                           Two sessile polyps were found in the transverse                            colon and ascending colon. The polyps were 4 to 5                            mm in size. These polyps were removed with a cold                            snare. Resection and retrieval were complete.                           Internal hemorrhoids were found during                            retroflexion. The hemorrhoids were medium-sized. Complications:            No immediate complications. Estimated Blood  Loss:     Estimated blood loss was minimal. Impression:               - Two 4 to 5 mm polyps in the transverse colon and                            in the ascending colon, removed with a cold snare.                            Resected and retrieved.                           - Internal hemorrhoids. Recommendation:           - Patient has a contact number available for                            emergencies. The signs and symptoms of potential                            delayed complications were discussed with the                            patient. Return to normal activities tomorrow.                            Written discharge instructions were provided to the                            patient.                           - Resume previous diet.                           - Continue present medications.                           - Await  pathology results.                           - Repeat colonoscopy should be considered for                            surveillance. The colonoscopy date will be                            determined after pathology results from today's                            exam become available for review. Jerene Bears, MD 12/07/2017 4:39:37 PM This report has been signed electronically.

## 2017-12-07 NOTE — Patient Instructions (Signed)
YOU HAD AN ENDOSCOPIC PROCEDURE TODAY AT THE [facility]:   Refer to the procedure report that was given to you for any specific questions about what was found during the examination.  If the procedure report does not answer your questions, please call your gastroenterologist to clarify.  If you requested that your care partner not be given the details of your procedure findings, then the procedure report has been included in a sealed envelope for you to review at your convenience later.  YOU SHOULD EXPECT: Some feelings of bloating in the abdomen. Passage of more gas than usual.  Walking can help get rid of the air that was put into your GI tract during the procedure and reduce the bloating. If you had a lower endoscopy (such as a colonoscopy or flexible sigmoidoscopy) you may notice spotting of blood in your stool or on the toilet paper. If you underwent a bowel prep for your procedure, you may not have a normal bowel movement for a few days.  Please Note:  You might notice some irritation and congestion in your nose or some drainage.  This is from the oxygen used during your procedure.  There is no need for concern and it should clear up in a day or so.  SYMPTOMS TO REPORT IMMEDIATELY:   Following lower endoscopy (colonoscopy or flexible sigmoidoscopy):  Excessive amounts of blood in the stool  Significant tenderness or worsening of abdominal pains  Swelling of the abdomen that is new, acute  Fever of 100F or higher  For urgent or emergent issues, a gastroenterologist can be reached at any hour by calling [phone].   DIET:  We do recommend a small meal at first, but then you may proceed to your regular diet.  Drink plenty of fluids but you should avoid alcoholic beverages for 24 hours.  MEDICATIONS: Continue present medications.  Please see handouts given to you by your recovery nurse.  ACTIVITY:  You should plan to take it easy for the rest of today and you should NOT  DRIVE or use heavy machinery until tomorrow (because of the sedation medicines used during the test).    FOLLOW UP: Our staff will call the number listed on your records the next business day following your procedure to check on you and address any questions or concerns that you may have regarding the information given to you following your procedure. If we do not reach you, we will leave a message.  However, if you are feeling well and you are not experiencing any problems, there is no need to return our call.  We will assume that you have returned to your regular daily activities without incident.  If any biopsies were taken you will be contacted by phone or by letter within the next 1-3 weeks.  Please call us at [phone] if you have not heard about the biopsies in 3 weeks.   Thank you for allowing us to provide for your healthcare needs today.   SIGNATURES/CONFIDENTIALITY: You and/or your care partner have signed paperwork which will be entered into your electronic medical record.  These signatures attest to the fact that that the information above on your After Visit Summary has been reviewed and is understood.  Full responsibility of the confidentiality of this discharge information lies with you and/or your care-partner. 

## 2017-12-07 NOTE — Progress Notes (Signed)
Called to room to assist during endoscopic procedure.  Patient ID and intended procedure confirmed with present staff. Received instructions for my participation in the procedure from the performing physician.  

## 2017-12-07 NOTE — Progress Notes (Signed)
Report given to PACU, vss 

## 2017-12-08 ENCOUNTER — Telehealth: Payer: Self-pay | Admitting: *Deleted

## 2017-12-08 NOTE — Telephone Encounter (Signed)
  Follow up Call-  Call back number 12/07/2017  Post procedure Call Back phone  # [phone]  Permission to leave phone message Yes  Some recent data might be hidden     Patient questions:  Do you have a fever, pain , or abdominal swelling? No. Pain Score  0 *  Have you tolerated food without any problems? Yes.    Have you been able to return to your normal activities? Yes.    Do you have any questions about your discharge instructions: Diet   No. Medications  No. Follow up visit  No.  Do you have questions or concerns about your Care? No.  Actions: * If pain score is 4 or above: Pain Complaint Variance Form Initiated.

## 2017-12-09 ENCOUNTER — Encounter: Payer: Medicare Other | Admitting: *Deleted

## 2017-12-09 VITALS — BP 131/67 | HR 59 | Wt 169.8 lb

## 2017-12-09 DIAGNOSIS — H6123 Impacted cerumen, bilateral: Secondary | ICD-10-CM | POA: Diagnosis not present

## 2017-12-09 NOTE — Progress Notes (Signed)
Subject to research clinic for visit T2-M1 in the clear research study.  No c/o, aes or saes to report.  100% compliant with meds and meds re-issued.  Next appointment scheduled.

## 2017-12-11 ENCOUNTER — Encounter: Payer: Self-pay | Admitting: Internal Medicine

## 2017-12-13 NOTE — Progress Notes (Signed)
Please call the patient's wife and make sure that he repeat his colonoscopy in 5 years since the polyps were precancerous

## 2018-01-24 ENCOUNTER — Other Ambulatory Visit: Payer: Self-pay | Admitting: Family Medicine

## 2018-01-25 ENCOUNTER — Telehealth: Payer: Self-pay | Admitting: Family Medicine

## 2018-01-25 NOTE — Telephone Encounter (Signed)
Pt's wife wanted him to have MRI so he can get a referral to Neurosurgeon and she is aware [facility] out of office for another 2 weeks and he would ntbs with another provider to discuss and evaluate. Pt given appt with Particia Nearing 01/27/18 at 9:10 per pt's wife request.

## 2018-01-27 ENCOUNTER — Telehealth: Payer: Self-pay | Admitting: Family Medicine

## 2018-01-27 ENCOUNTER — Ambulatory Visit (INDEPENDENT_AMBULATORY_CARE_PROVIDER_SITE_OTHER): Payer: Medicare Other | Admitting: Physician Assistant

## 2018-01-27 ENCOUNTER — Encounter: Payer: Self-pay | Admitting: Physician Assistant

## 2018-01-27 VITALS — BP 126/71 | HR 73 | Temp 97.4°F | Ht 65.0 in | Wt 171.2 lb

## 2018-01-27 DIAGNOSIS — G8929 Other chronic pain: Secondary | ICD-10-CM

## 2018-01-27 DIAGNOSIS — R531 Weakness: Secondary | ICD-10-CM

## 2018-01-27 DIAGNOSIS — M545 Low back pain, unspecified: Secondary | ICD-10-CM | POA: Insufficient documentation

## 2018-01-27 NOTE — Telephone Encounter (Signed)
noted 

## 2018-01-27 NOTE — Progress Notes (Signed)
BP 126/71   Pulse 73   Temp (!) 97.4 F (36.3 C) (Oral)   Ht 5\' 5"  (1.651 m)   Wt 171 lb 3.2 oz (77.7 kg)   BMI 28.49 kg/m    Subjective:    Patient ID: Jacob Conner, male    DOB: 1940/11/23, 77 y.o.   MRN: [id number]  HPI: Jacob Conner is a 77 y.o. male presenting on 01/27/2018 for Back Pain (would like MRI and referral to [facility] )  This patient comes in with greater than 19-month duration of of lumbar pain.  It is in the midline and above the sacral area.  He does not have any radiation down either leg.  But he does feel weak at times.  If he sits perfectly still he can be relieved the pain but when he is standing up and walking and especially reaching overhead he has extremely bad pain.  He was seen by Dr. Gladstone Lighter and found to have a abnormal x-ray that showed some degenerative disc changes and some rotation of the lower spine.  We would like to get an MRI as soon as possible due to the severity of his symptoms despite chronic conservative treatment it has not been effective.  The patient would also like to have a referral to [facility] after this has been performed.  Past Medical History:  Diagnosis Date  . Allergy   . Arthritis   . BPH (benign prostatic hyperplasia)   . Cancer ([place])    melanoma on back  . 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 Dyspnea   . Graves disease   . History of Graves' disease 2015  . History of kidney stones   . Hyperlipidemia   . Prostate disorder   . Stroke [facility])    ?,  pt was not aware of having a stroke/frontal lobe infarct  . Thyroid disease    Hyperthyroidism  and Graves   Relevant past medical, surgical, family and social history reviewed and updated as indicated. Interim medical history since our last visit reviewed. Allergies and medications reviewed and updated. DATA REVIEWED: CHART IN EPIC  Family History reviewed for pertinent findings.  Review of Systems  Constitutional: Negative.  Negative for appetite  change and fatigue.  Eyes: Negative for pain and visual disturbance.  Respiratory: Negative.  Negative for cough, chest tightness, shortness of breath and wheezing.   Cardiovascular: Negative.  Negative for chest pain, palpitations and leg swelling.  Gastrointestinal: Negative.  Negative for abdominal pain, diarrhea, nausea and vomiting.  Genitourinary: Negative.   Musculoskeletal: Positive for arthralgias, back pain and myalgias.  Skin: Negative.  Negative for color change and rash.  Neurological: Negative.  Negative for weakness, numbness and headaches.  Psychiatric/Behavioral: Negative.     Allergies as of 01/27/2018      Reactions   Topiramate Other (See Comments)   Cognitive clouding Cognitive clouding      Medication List        Accurate as of 01/27/18  9:32 AM. Always use your most recent med list.          AMBULATORY NON FORMULARY MEDICATION Take 180 mg by mouth daily. Medication Name: bempedoic acid 180 mgs vs a placebo, study drug provided   aspirin EC 81 MG tablet Take 81 mg by mouth daily.   finasteride 5 MG tablet Commonly known as:  PROSCAR TAKE 1 TABLET ONCE DAILY.   furosemide 20 MG tablet Commonly known as:  LASIX TAKE 1 TABLET  ONCE DAILY.   hydroxypropyl methylcellulose / hypromellose 2.5 % ophthalmic solution Commonly known as:  ISOPTO TEARS / GONIOVISC Place 2 drops into both eyes as needed for dry eyes (IRRITATION).   methimazole 5 MG tablet Commonly known as:  TAPAZOLE Take 5 mg by mouth daily.   Vitamin D (Ergocalciferol) 50000 units Caps capsule Commonly known as:  DRISDOL TAKE 1 CAPSULE BY MOUTH ONCE A WEEK.          Objective:    BP 126/71   Pulse 73   Temp (!) 97.4 F (36.3 C) (Oral)   Ht 5\' 5"  (1.651 m)   Wt 171 lb 3.2 oz (77.7 kg)   BMI 28.49 kg/m   Allergies  Allergen Reactions  . Topiramate Other (See Comments)    Cognitive clouding Cognitive clouding    Wt Readings from Last 3 Encounters:  01/27/18 171 lb 3.2 oz  (77.7 kg)  12/09/17 169 lb 12.8 oz (77 kg)  12/07/17 172 lb (78 kg)    Physical Exam  Constitutional: He appears well-developed and well-nourished. No distress.  HENT:  Head: Normocephalic and atraumatic.  Eyes: Pupils are equal, round, and reactive to light. Conjunctivae and EOM are normal.  Cardiovascular: Normal rate, regular rhythm and normal heart sounds.  Pulmonary/Chest: Effort normal and breath sounds normal. No respiratory distress.  Musculoskeletal:       Lumbar back: He exhibits decreased range of motion, tenderness, pain and spasm.       Back:  Absent reflex in patellar tendons. Increased pain with straight leg raise bilaterally  Skin: Skin is warm and dry.  Psychiatric: He has a normal mood and affect. His behavior is normal.  Nursing note and vitals reviewed.       Assessment & Plan:   1. Chronic midline low back pain without sciatica - MR Lumbar Spine Wo Contrast; Future  2. Weakness - MR Lumbar Spine Wo Contrast; Future   Continue all other maintenance medications as listed above.  Follow up plan: No follow-ups on file.  Educational handout given for [place] PA-C Joes [address]  [place], [place] [address] [phone]   01/27/2018, 9:32 AM

## 2018-01-27 NOTE — Telephone Encounter (Signed)
Patient wife request he have an MRI and she wants referral to [facility]. FYI

## 2018-01-29 ENCOUNTER — Ambulatory Visit: Payer: Medicare Other | Admitting: Vascular Surgery

## 2018-01-29 ENCOUNTER — Encounter (HOSPITAL_COMMUNITY): Payer: Medicare Other

## 2018-02-03 DIAGNOSIS — L819 Disorder of pigmentation, unspecified: Secondary | ICD-10-CM | POA: Diagnosis not present

## 2018-02-03 DIAGNOSIS — D229 Melanocytic nevi, unspecified: Secondary | ICD-10-CM | POA: Diagnosis not present

## 2018-02-03 DIAGNOSIS — L570 Actinic keratosis: Secondary | ICD-10-CM | POA: Diagnosis not present

## 2018-02-03 DIAGNOSIS — L821 Other seborrheic keratosis: Secondary | ICD-10-CM | POA: Diagnosis not present

## 2018-02-03 DIAGNOSIS — Z85820 Personal history of malignant melanoma of skin: Secondary | ICD-10-CM | POA: Diagnosis not present

## 2018-02-05 ENCOUNTER — Ambulatory Visit (HOSPITAL_COMMUNITY)
Admission: RE | Admit: 2018-02-05 | Discharge: 2018-02-05 | Disposition: A | Discharge status: Home / Self Care | Payer: Medicare Other | Source: Ambulatory Visit | Attending: Physician Assistant | Admitting: Physician Assistant

## 2018-02-05 DIAGNOSIS — M5136 Other intervertebral disc degeneration, lumbar region: Secondary | ICD-10-CM | POA: Insufficient documentation

## 2018-02-05 DIAGNOSIS — M545 Low back pain: Secondary | ICD-10-CM | POA: Diagnosis not present

## 2018-02-05 DIAGNOSIS — R531 Weakness: Secondary | ICD-10-CM

## 2018-02-05 DIAGNOSIS — G8929 Other chronic pain: Secondary | ICD-10-CM

## 2018-02-05 DIAGNOSIS — M5126 Other intervertebral disc displacement, lumbar region: Secondary | ICD-10-CM | POA: Diagnosis not present

## 2018-02-05 DIAGNOSIS — M4807 Spinal stenosis, lumbosacral region: Secondary | ICD-10-CM | POA: Diagnosis not present

## 2018-02-05 DIAGNOSIS — M48061 Spinal stenosis, lumbar region without neurogenic claudication: Secondary | ICD-10-CM | POA: Insufficient documentation

## 2018-02-08 ENCOUNTER — Ambulatory Visit: Payer: Medicare Other | Admitting: Adult Health

## 2018-02-08 ENCOUNTER — Encounter: Payer: Self-pay | Admitting: Adult Health

## 2018-02-08 ENCOUNTER — Other Ambulatory Visit: Payer: Self-pay | Admitting: *Deleted

## 2018-02-08 VITALS — BP 119/70 | HR 74 | Ht 65.0 in | Wt 171.4 lb

## 2018-02-08 DIAGNOSIS — G43009 Migraine without aura, not intractable, without status migrainosus: Secondary | ICD-10-CM | POA: Diagnosis not present

## 2018-02-08 DIAGNOSIS — G8929 Other chronic pain: Secondary | ICD-10-CM

## 2018-02-08 DIAGNOSIS — R9389 Abnormal findings on diagnostic imaging of other specified body structures: Secondary | ICD-10-CM

## 2018-02-08 DIAGNOSIS — S04891D Injury of other cranial nerves, right side, subsequent encounter: Secondary | ICD-10-CM

## 2018-02-08 DIAGNOSIS — M545 Low back pain: Principal | ICD-10-CM

## 2018-02-08 NOTE — Patient Instructions (Signed)
Your Plan:  Continue to monitor symptoms If your symptoms worsen or you develop new symptoms please let us know.   Thank you for coming to see us at [facility]. I hope we have been able to provide you high quality care today.  You may receive a patient satisfaction survey over the next few weeks. We would appreciate your feedback and comments so that we may continue to improve ourselves and the health of our patients.   

## 2018-02-08 NOTE — Progress Notes (Signed)
I have read the note, and I agree with the clinical assessment and plan.  Bella Brummet K Brok Stocking   

## 2018-02-08 NOTE — Progress Notes (Signed)
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 He reports that he is doing well. No headaches since his procedure. He weaned off zonegran and still no headaches. Reports that he can now spit and swallow without difficulty. Still has some trouble saying certain words. He returns today for follow-up.   HISTORY 08/06/17 Jacob Conner is a 77 year old male with a history of migraine headaches.  He returns today for follow-up.  Since last visit he had an MRI that revealed an old stroke.  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  He saw Dr. Donzetta Matters who completed a right endarterectomy.  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  He states that he is able to chew and swallow normally.  He states that since the procedure he has not had any headaches.  He would like to wean off of Zonegran if possible.   REVIEW OF SYSTEMS: Out of a complete 14 system review of symptoms, the patient complains only of the following symptoms, and all other reviewed systems are negative.  See HPI  ALLERGIES: Allergies  Allergen Reactions  . Topiramate Other (See Comments)    Cognitive clouding Cognitive clouding    HOME MEDICATIONS: Outpatient Medications Prior to Visit  Medication Sig Dispense Refill  . AMBULATORY NON FORMULARY MEDICATION Take 180 mg by mouth daily. Medication Name: bempedoic acid 180 mgs vs a placebo, study drug provided    . aspirin EC 81 MG tablet Take 81 mg by mouth daily.    . finasteride (PROSCAR) 5 MG tablet TAKE 1 TABLET ONCE DAILY. 30 tablet 5  . furosemide (LASIX) 20 MG tablet TAKE 1 TABLET ONCE DAILY. (Patient taking differently: TAKE 1 TABLET  EVERY OTHER DAY) 30 tablet 2  . hydroxypropyl methylcellulose / hypromellose (ISOPTO TEARS / GONIOVISC) 2.5 % ophthalmic solution Place 2 drops into both eyes as needed for dry eyes (IRRITATION).    Marland Kitchen methimazole (TAPAZOLE) 5 MG tablet Take 5 mg by mouth daily.     . Vitamin D, Ergocalciferol, (DRISDOL) 50000 units CAPS capsule TAKE 1 CAPSULE BY MOUTH ONCE A WEEK. 4 capsule 0   No facility-administered medications prior to visit.     PAST MEDICAL HISTORY: Past Medical History:  Diagnosis Date  . Allergy   . Arthritis   . BPH (benign prostatic hyperplasia)   . Cancer ([place])    melanoma on back  . 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 Dyspnea   . Graves disease   . History of Graves' disease 2015  . History of kidney stones   . Hyperlipidemia   . Prostate disorder   . Stroke [facility])    ?,  pt was not aware of having a stroke/frontal lobe infarct  . Thyroid disease    Hyperthyroidism  and Graves    PAST SURGICAL HISTORY: Past Surgical History:  Procedure Laterality Date  . COLONOSCOPY    . ENDARTERECTOMY Right 07/06/2017   Procedure: ENDARTERECTOMY CAROTID RIGHT;  Surgeon: Waynetta Sandy, MD;  Location: [facility];  Service: Vascular;  Laterality: Right;  . ESOPHAGOGASTRODUODENOSCOPY    . HYDROCELE EXCISION    . LITHOTRIPSY  2 times  . PATCH ANGIOPLASTY Right 07/06/2017   Procedure: PATCH ANGIOPLASTY RIGHT CAROTID ARTERY;  Surgeon: Waynetta Sandy, MD;  Location[facility] OR;  Service: Vascular;  Laterality: Right;    FAMILY HISTORY: Family History  Problem Relation Age of Onset  . Liver disease Mother   . Colon cancer Father   . Heart disease Father     SOCIAL HISTORY: Social History   Socioeconomic History  . Marital status: Married    Spouse name: Butch Penny  . Number of children: Not on file  . Years of education: Not on file  . Highest education level: Not on file  Occupational History  . Not on file  Social Needs  . Financial  resource strain: Not on file  . Food insecurity:    Worry: Not on file    Inability: Not on file  . Transportation needs:    Medical: Not on file    Non-medical: Not on file  Tobacco Use  . Smoking status: Never Smoker  . Smokeless tobacco: Current User    Types: Chew  . Tobacco comment: chew 1 pack per day since he was 77 years old  Substance and Sexual Activity  . Alcohol use: No  . Drug use: No  . Sexual activity: Not on file  Lifestyle  . Physical activity:    Days per week: Not on file    Minutes per session: Not on file  . Stress: Not on file  Relationships  . Social connections:    Talks on phone: Not on file    Gets together: Not on file    Attends religious service: Not on file    Active member of club or organization: Not on file    Attends meetings of clubs or organizations: Not on file    Relationship status: Not on file  . Intimate partner violence:    Fear of current or ex partner: Not on file    Emotionally abused: Not on file    Physically abused: Not on file    Forced sexual activity: Not on file  Other Topics Concern  . Not on file  Social History Narrative   Lives with wife   Caffeine use: 1 cup coffee per day, 1 soda per day   Right-handed      PHYSICAL EXAM  Vitals:   02/08/18 1321  BP: 119/70  Pulse: 74  SpO2: 96%  Weight: 171 lb 6.4 oz (77.7 kg)  Height: 5\' 5"  (1.651 m)   Body mass index is 28.52 kg/m.  Generalized: Well developed, in no acute distress   Neurological examination  Mentation: Alert oriented to time, place, history taking. Follows all commands speech and language fluent Cranial nerve II-XII: Pupils were equal round reactive to light. Extraocular movements were full, visual field were full on confrontational test. Facial sensation and strength were normal. Uvula tongue midline. Head turning and shoulder shrug  were normal and symmetric. Motor: The motor testing reveals 5 over 5 strength of all 4 extremities. Good  symmetric motor tone is noted throughout.  Sensory: Sensory testing is intact to soft touch on all 4 extremities. No evidence of extinction is noted.  Coordination: Cerebellar testing reveals good finger-nose-finger and heel-to-shin bilaterally.  Gait and station: Gait is normal. Reflexes: Deep tendon reflexes are symmetric and normal bilaterally.   DIAGNOSTIC DATA (LABS, IMAGING, TESTING) - I reviewed patient records, labs, notes, testing and imaging myself where available.  Lab Results  Component Value Date   WBC  10.4 07/07/2017   HGB 12.8 (L) 07/07/2017   HCT 38.2 (L) 07/07/2017   MCV 91.0 07/07/2017   PLT 259 07/07/2017      Component Value Date/Time   NA 135 07/07/2017 0346   NA 141 10/20/2016 0803   K 3.6 07/07/2017 0346   CL 105 07/07/2017 0346   CO2 22 07/07/2017 0346   GLUCOSE 131 (H) 07/07/2017 0346   BUN 16 07/07/2017 0346   BUN 16 10/20/2016 0803   CREATININE 1.02 07/07/2017 0346   CALCIUM 8.6 (L) 07/07/2017 0346   PROT 6.8 07/03/2017 1422   PROT 6.2 10/20/2016 0803   ALBUMIN 4.3 07/03/2017 1422   ALBUMIN 4.2 10/20/2016 0803   AST 24 07/03/2017 1422   ALT 14 (L) 07/03/2017 1422   ALKPHOS 98 07/03/2017 1422   BILITOT 0.6 07/03/2017 1422   BILITOT 0.3 10/20/2016 0803   GFRNONAA >60 07/07/2017 0346   GFRAA >60 07/07/2017 0346   Lab Results  Component Value Date   CHOL 236 (H) 10/20/2016   HDL 36 (L) 10/20/2016   LDLCALC 173 (H) 10/20/2016   TRIG 137 10/20/2016   CHOLHDL 6.6 (H) 10/20/2016   Lab Results  Component Value Date   TSH 2.050 10/20/2016      ASSESSMENT AND PLAN 77 y.o. year old male  has a past medical history of Allergy, Arthritis, BPH (benign prostatic hyperplasia), Cancer ([place]), Classical migraine with intractable migraine (11/10/2016), Dyspnea, Graves disease, History of Graves' disease (2015), History of kidney stones, Hyperlipidemia, Prostate disorder, Stroke ([place]), and Thyroid disease. here with:  1. 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 No longer having headaches. He has recovered well for nerve injury. He is advised that if his symptoms worsen or he develops new symptoms he should let us know. He will f/u as needed.    Ward Givens, MSN, NP-C 02/08/2018, 1:26 PM [facility] [address] [place], [place] [address] [phone]

## 2018-02-09 ENCOUNTER — Encounter: Payer: Medicare Other | Admitting: *Deleted

## 2018-02-09 ENCOUNTER — Encounter: Payer: Self-pay | Admitting: *Deleted

## 2018-02-09 DIAGNOSIS — Z006 Encounter for examination for normal comparison and control in clinical research program: Secondary | ICD-10-CM

## 2018-02-09 NOTE — Progress Notes (Signed)
Subject to research clinic for visit T3M3 in the Clear research program. No cos, aes or saes to report.  Subject >80% compliant with meds and new drug dispensed.  Next clinic visit scheduled.

## 2018-02-16 DIAGNOSIS — Z79899 Other long term (current) drug therapy: Secondary | ICD-10-CM | POA: Diagnosis not present

## 2018-02-16 DIAGNOSIS — E0500 Thyrotoxicosis with diffuse goiter without thyrotoxic crisis or storm: Secondary | ICD-10-CM | POA: Diagnosis not present

## 2018-02-19 ENCOUNTER — Ambulatory Visit: Payer: Medicare Other | Admitting: Vascular Surgery

## 2018-02-19 ENCOUNTER — Encounter: Payer: Self-pay | Admitting: Vascular Surgery

## 2018-02-19 ENCOUNTER — Other Ambulatory Visit: Payer: Self-pay

## 2018-02-19 ENCOUNTER — Ambulatory Visit (HOSPITAL_COMMUNITY)
Admission: RE | Admit: 2018-02-19 | Discharge: 2018-02-19 | Disposition: A | Discharge status: Home / Self Care | Payer: Medicare Other | Source: Ambulatory Visit | Attending: Vascular Surgery | Admitting: Vascular Surgery

## 2018-02-19 VITALS — BP 139/83 | HR 63 | Temp 97.6°F | Resp 18 | Ht 66.0 in | Wt 169.0 lb

## 2018-02-19 DIAGNOSIS — I6523 Occlusion and stenosis of bilateral carotid arteries: Secondary | ICD-10-CM | POA: Insufficient documentation

## 2018-02-19 DIAGNOSIS — I639 Cerebral infarction, unspecified: Secondary | ICD-10-CM

## 2018-02-19 DIAGNOSIS — I6521 Occlusion and stenosis of right carotid artery: Secondary | ICD-10-CM

## 2018-02-19 NOTE — Progress Notes (Signed)
Patient ID: Jacob Conner, male   DOB: 02-Nov-1940, 77 y.o.   MRN: [id number]  Reason for Consult: Carotid (f/u)   Referred by Chipper Herb, MD  Subjective:     HPI:  Jacob Conner is a 77 y.o. male with a history of right-sided symptomatic carotid stenosis underwent carotid endarterectomy.  Unfortunately postoperatively he had difficulty singing in charge also some issues with his tongue movement.  He deferred speech therapy and ENT evaluation.  He follows up today with repeat carotid duplex.  He remains on aspirin.  States that he is now able to chew tobacco and spit and also he is back to singing in church since last month.  He does have some issues with his words at times but is not having any swallowing issues.  Overall he feels mostly recovered at this time.  Past Medical History:  Diagnosis Date  . Allergy   . Arthritis   . BPH (benign prostatic hyperplasia)   . Cancer ([place])    melanoma on back  . 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 Dyspnea   . Graves disease   . History of Graves' disease 2015  . History of kidney stones   . Hyperlipidemia   . Prostate disorder   . Stroke [facility])    ?,  pt was not aware of having a stroke/frontal lobe infarct  . Thyroid disease    Hyperthyroidism  and Graves   Family History  Problem Relation Age of Onset  . Liver disease Mother   . Colon cancer Father   . Heart disease Father    Past Surgical History:  Procedure Laterality Date  . COLONOSCOPY    . ENDARTERECTOMY Right 07/06/2017   Procedure: ENDARTERECTOMY CAROTID RIGHT;  Surgeon: Waynetta Sandy, MD;  Location: [facility];  Service: Vascular;  Laterality: Right;  . ESOPHAGOGASTRODUODENOSCOPY    . HYDROCELE EXCISION    . LITHOTRIPSY     2 times  . PATCH ANGIOPLASTY Right 07/06/2017   Procedure: PATCH ANGIOPLASTY RIGHT CAROTID ARTERY;  Surgeon: Waynetta Sandy, MD;  Location: [facility] OR;  Service: Vascular;  Laterality: Right;    Short  Social History:  Social History   Tobacco Use  . Smoking status: Never Smoker  . Smokeless tobacco: Current User    Types: Chew  . Tobacco comment: chew 1 pack per day since he was 77 years old  Substance Use Topics  . Alcohol use: No    Allergies  Allergen Reactions  . Topiramate Other (See Comments)    Cognitive clouding Cognitive clouding    Current Outpatient Medications  Medication Sig Dispense Refill  . AMBULATORY NON FORMULARY MEDICATION Take 180 mg by mouth daily. Medication Name: bempedoic acid 180 mgs vs a placebo, study drug provided    . aspirin EC 81 MG tablet Take 81 mg by mouth daily.    . finasteride (PROSCAR) 5 MG tablet TAKE 1 TABLET ONCE DAILY. 30 tablet 5  . furosemide (LASIX) 20 MG tablet TAKE 1 TABLET ONCE DAILY. (Patient taking differently: TAKE 1 TABLET EVERY OTHER DAY) 30 tablet 2  . hydroxypropyl methylcellulose / hypromellose (ISOPTO TEARS / GONIOVISC) 2.5 % ophthalmic solution Place 2 drops into both eyes as needed for dry eyes (IRRITATION).    Marland Kitchen methimazole (TAPAZOLE) 5 MG tablet Take 5 mg by mouth daily.     . Vitamin D, Ergocalciferol, (DRISDOL) 50000 units CAPS capsule TAKE 1 CAPSULE BY MOUTH ONCE A WEEK. 4 capsule 0  No current facility-administered medications for this visit.     Review of Systems  Constitutional:  Constitutional negative. HENT: HENT negative.  Eyes: Eyes negative.  Cardiovascular: Positive for leg swelling.  GI: Gastrointestinal negative.  Musculoskeletal: Positive for leg pain.  Skin: Skin negative.  Neurological: Neurological negative. Hematologic: Hematologic/lymphatic negative.  Psychiatric: Psychiatric negative.        Objective:  Objective   Vitals:   02/19/18 1031 02/19/18 1035  BP: 128/83 139/83  Pulse: 63 63  Resp: 18   Temp: 97.6 F (36.4 C)   TempSrc: Oral   SpO2: 98%   Weight: 169 lb (76.7 kg)   Height: 5\' 6"  (1.676 m)    Body mass index is 27.28 kg/m.  Physical Exam  Constitutional: He is  oriented to person, place, and time. He appears well-developed.  HENT:  Head: Normocephalic.  Eyes: Pupils are equal, round, and reactive to light.  Neck: Normal range of motion.  well-healed right neck incision  Cardiovascular: Normal rate.  Pulses:      Carotid pulses are 2+ on the right side, and 2+ on the left side.      Radial pulses are 2+ on the right side, and 2+ on the left side.       Popliteal pulses are 2+ on the right side, and 2+ on the left side.  Abdominal: Soft.  Musculoskeletal: Normal range of motion. He exhibits no edema.  Neurological: He is alert and oriented to person, place, and time.  Skin: Skin is warm and dry.    Data: I have independently interpreted his bilateral carotid duplexes which demonstrate 1 to 39% stenosis bilaterally.     Assessment/Plan:     77 year old male underwent right carotid endarterectomy for symptomatic disease late last year.  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  He will continue aspirin and we will see him in another 6 months with carotid duplex.  He is to see a spine surgeon in the near future and he would be cleared from my standpoint for any interventions.  If there are any other issues I will see him sooner we will otherwise get him repeat duplex in 6 months.     Waynetta Sandy MD [facility]

## 2018-03-02 ENCOUNTER — Ambulatory Visit (INDEPENDENT_AMBULATORY_CARE_PROVIDER_SITE_OTHER): Payer: Medicare Other | Admitting: Family Medicine

## 2018-03-02 ENCOUNTER — Encounter: Payer: Self-pay | Admitting: Family Medicine

## 2018-03-02 ENCOUNTER — Ambulatory Visit (INDEPENDENT_AMBULATORY_CARE_PROVIDER_SITE_OTHER): Payer: Medicare Other

## 2018-03-02 VITALS — BP 124/79 | HR 68 | Temp 97.5°F | Ht 66.0 in | Wt 170.0 lb

## 2018-03-02 DIAGNOSIS — R0602 Shortness of breath: Secondary | ICD-10-CM | POA: Diagnosis not present

## 2018-03-02 DIAGNOSIS — E0500 Thyrotoxicosis with diffuse goiter without thyrotoxic crisis or storm: Secondary | ICD-10-CM

## 2018-03-02 DIAGNOSIS — R5383 Other fatigue: Secondary | ICD-10-CM

## 2018-03-02 DIAGNOSIS — Z7689 Persons encountering health services in other specified circumstances: Secondary | ICD-10-CM | POA: Diagnosis not present

## 2018-03-02 DIAGNOSIS — W57XXXA Bitten or stung by nonvenomous insect and other nonvenomous arthropods, initial encounter: Secondary | ICD-10-CM

## 2018-03-02 DIAGNOSIS — M791 Myalgia, unspecified site: Secondary | ICD-10-CM

## 2018-03-02 NOTE — Progress Notes (Signed)
Subjective:    Patient ID: Jacob Conner, male    DOB: 01-Aug-1940, 77 y.o.   MRN: [id number]  HPI Pt here for fatigue, tick bites, SOB and back pain.  The patient today comes in complaining of fatigue tick bite shortness of breath and back pain.  He is afebrile.  Patient significant history is Graves' disease.  He has been seeing a specialist at [facility].  Acne thickened family history is is that his father had colon cancer.  Is currently on Tapazole furosemide Proscar and vitamin D replacement.  The patient's wife comes with him to the visit today.  He was not happy about her being in the room.  She says that this winter he is worked hard with removing a huge tree that fell in their yard and all of this back pain started after that.  They currently have an appointment with the neurosurgeon, Dr. Ellene Route after getting x-rays of the back and an MRI.  He will make further recommendations regarding that.  He is to remove multiple ticks from his body that sound like deer ticks.  He also has the muscle aches and myalgias.  He has had some shortness of breath and also some hoarseness.  He is recently seen the endocrinologist and everything is good with his thyroid situation and Graves' disease.  Today we will go ahead and do the tick titers and start him on doxycycline because of the deer tick bites and await the results of the lab work.  We will also encourage him to take some anti-inflammatory medicines over-the-counter until he sees the neurosurgeon just to see if it may help him some.  We recommended Aleve 1 twice daily after breakfast and supper.   Patient Active Problem List   Diagnosis Date Noted  . Chronic midline low back pain without sciatica 01/27/2018  . Carotid artery stenosis 07/06/2017  . 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 Graves disease 10/12/2015  . 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 Hyperthyroidism 06/08/2013  . BPH  (benign prostatic hyperplasia) 04/19/2013  . Hyperlipidemia 04/19/2013  . 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 Spermatocele 04/19/2013  . Hx of colonic polyp 04/19/2013  . History of migraine headaches 04/19/2013   Outpatient Encounter Medications as of 03/02/2018  Medication Sig  . AMBULATORY NON FORMULARY MEDICATION Take 180 mg by mouth daily. Medication Name: bempedoic acid 180 mgs vs a placebo, study drug provided  . aspirin EC 81 MG tablet Take 81 mg by mouth daily.  . finasteride (PROSCAR) 5 MG tablet TAKE 1 TABLET ONCE DAILY.  . furosemide (LASIX) 20 MG tablet TAKE 1 TABLET ONCE DAILY. (Patient taking differently: TAKE 1 TABLET EVERY OTHER DAY)  . hydroxypropyl methylcellulose / hypromellose (ISOPTO TEARS / GONIOVISC) 2.5 % ophthalmic solution Place 2 drops into both eyes as needed for dry eyes (IRRITATION).  Marland Kitchen methimazole (TAPAZOLE) 5 MG tablet Take 5 mg by mouth daily.   . Vitamin D, Ergocalciferol, (DRISDOL) 50000 units CAPS capsule TAKE 1 CAPSULE BY MOUTH ONCE A WEEK.   No facility-administered encounter medications on file as of 03/02/2018.       Review of Systems  Constitutional: Positive for fatigue.  HENT: Negative.   Eyes: Negative.   Respiratory: Positive for shortness of breath.   Cardiovascular: Negative.   Gastrointestinal: Negative.   Endocrine: Negative.   Genitourinary: Negative.   Musculoskeletal: Positive for back pain.  Skin: Negative.  Tick bites  Allergic/Immunologic: Negative.   Neurological: Negative.   Hematological: Negative.   Psychiatric/Behavioral: Negative.        Objective:   Physical Exam  Constitutional: He is oriented to person, place, and time. He appears well-developed and well-nourished. No distress.  The patient is pleasant but somewhat frustrated that he has to be here in the office.  HENT:  Head: Normocephalic and atraumatic.  Right Ear: External ear normal.  Left Ear: External  ear normal.  Nose: Nose normal.  Mouth/Throat: Oropharynx is clear and moist. No oropharyngeal exudate.  Eyes: Pupils are equal, round, and reactive to light. Conjunctivae and EOM are normal. Right eye exhibits no discharge. Left eye exhibits no discharge. No scleral icterus.  Pupils equal round and reactive to light and accommodation and no sign of any infection.  Neck: Normal range of motion. Neck supple. No thyromegaly present.  Cardiovascular: Normal rate, regular rhythm, normal heart sounds and intact distal pulses.  No murmur heard. Pulses:      Carotid pulses are on the right side with bruit, and on the left side with bruit. Heart is regular at 60/min  Pulmonary/Chest: Effort normal and breath sounds normal. No respiratory distress. He has no decreased breath sounds. He has no wheezes. He has no rales. He exhibits no tenderness and no swelling.  Clear anteriorly and posteriorly  Abdominal: Soft. Bowel sounds are normal. He exhibits no ascites and no mass. There is no splenomegaly or hepatomegaly. There is no tenderness. There is no rebound and no guarding.  Genitourinary: Rectum normal, prostate normal and penis normal.  Musculoskeletal: Normal range of motion. He exhibits no edema or tenderness.       Right lower leg: Normal. He exhibits no edema.       Left lower leg: Normal.  Lymphadenopathy:    He has no cervical adenopathy.  Neurological: He is alert and oriented to person, place, and time. He has normal reflexes. No cranial nerve deficit.  Skin: Skin is warm and dry. No rash noted.  Multiple bite sites  Psychiatric: He has a normal mood and affect. His behavior is normal. Judgment and thought content normal. He is not agitated.  Mood affect and behavior for this patient are normal.  Nursing note and vitals reviewed.   BP 124/79 (BP Location: Left Arm)   Pulse 68   Temp (!) 97.5 F (36.4 C) (Oral)   Ht 5\' 6"  (1.676 m)   Wt 170 lb (77.1 kg)   BMI 27.44 kg/m          Assessment & Plan:  1. Tick bite, initial encounter -Start doxycycline 100 mg twice daily with enough for 3 weeks with food - CBC with Differential/Platelet - Lyme Ab/Western Blot Reflex - Rocky mtn spotted fvr abs pnl(IgG+IgM)  2. Other fatigue - CBC with Differential/Platelet - Lyme Ab/Western Blot Reflex - Rocky mtn spotted fvr abs pnl(IgG+IgM)  3. Myalgia - Lyme Ab/Western Blot Reflex - Rocky mtn spotted fvr abs pnl(IgG+IgM)  4. Graves disease -Recent visit to the endocrinologist was good and the patient will continue with his current medicines.  5. SOB (shortness of breath) - DG Chest 2 View; Future   No orders of the defined types were placed in this encounter.  Patient Instructions  Take Aleve 1 twice daily after breakfast and supper until visit occurs with neurosurgeon and let him know if this is helped any or not If this medicine bothers the stomach discontinue it Take doxycycline 1 twice daily  with food enough for 3 weeks and if the deer tick test are negative he can discontinue this after 2 weeks. Drink more water!  Drink more water! If the hoarseness persists he should call us back the end of the month and we will arrange for him to see an ear nose and throat specialist because of his long history of chewing tobacco. We will get a chest x-ray today.  Arrie Senate MD

## 2018-03-02 NOTE — Patient Instructions (Signed)
Take Aleve 1 twice daily after breakfast and supper until visit occurs with neurosurgeon and let him know if this is helped any or not If this medicine bothers the stomach discontinue it Take doxycycline 1 twice daily with food enough for 3 weeks and if the deer tick test are negative he can discontinue this after 2 weeks. Drink more water!  Drink more water! If the hoarseness persists he should call us back the end of the month and we will arrange for him to see an ear nose and throat specialist because of his long history of chewing tobacco. We will get a chest x-ray today.

## 2018-03-03 ENCOUNTER — Other Ambulatory Visit: Payer: Self-pay | Admitting: Family Medicine

## 2018-03-03 MED ORDER — DOXYCYCLINE HYCLATE 100 MG PO TABS: 100.0000 mg | ORAL_TABLET | Freq: Two times a day (BID) | ORAL | 0 refills | Status: DC

## 2018-03-03 NOTE — Telephone Encounter (Signed)
Med sent in - pt aware  

## 2018-03-04 DIAGNOSIS — H6122 Impacted cerumen, left ear: Secondary | ICD-10-CM | POA: Diagnosis not present

## 2018-03-04 DIAGNOSIS — H6062 Unspecified chronic otitis externa, left ear: Secondary | ICD-10-CM | POA: Diagnosis not present

## 2018-03-04 DIAGNOSIS — H9313 Tinnitus, bilateral: Secondary | ICD-10-CM | POA: Diagnosis not present

## 2018-03-04 DIAGNOSIS — H6522 Chronic serous otitis media, left ear: Secondary | ICD-10-CM | POA: Diagnosis not present

## 2018-03-04 LAB — LYME AB/WESTERN BLOT REFLEX: LYME DISEASE AB, QUANT, IGM: <0.8 index (ref 0.00–0.79)

## 2018-03-04 LAB — CBC WITH DIFFERENTIAL/PLATELET
BASOS: 0 %
Basophils Absolute: 0 10*3/uL (ref 0.0–0.2)
EOS (ABSOLUTE): 0.2 10*3/uL (ref 0.0–0.4)
EOS: 3 %
HEMATOCRIT: 42.3 % (ref 37.5–51.0)
HEMOGLOBIN: 14.6 g/dL (ref 13.0–17.7)
IMMATURE GRANS (ABS): 0 10*3/uL (ref 0.0–0.1)
Immature Granulocytes: 0 %
LYMPHS ABS: 1.9 10*3/uL (ref 0.7–3.1)
LYMPHS: 24 %
MCH: 30.7 pg (ref 26.6–33.0)
MCHC: 34.5 g/dL (ref 31.5–35.7)
MCV: 89 fL (ref 79–97)
MONOCYTES: 7 %
Monocytes Absolute: 0.6 10*3/uL (ref 0.1–0.9)
NEUTROS ABS: 5.1 10*3/uL (ref 1.4–7.0)
Neutrophils: 66 %
Platelets: 332 10*3/uL (ref 150–450)
RBC: 4.76 x10E6/uL (ref 4.14–5.80)
RDW: 13.7 % (ref 12.3–15.4)
WBC: 7.8 10*3/uL (ref 3.4–10.8)

## 2018-03-04 LAB — ROCKY MTN SPOTTED FVR ABS PNL(IGG+IGM)
RMSF IGM: 0.59 {index} (ref 0.00–0.89)
RMSF IgG: POSITIVE — AB

## 2018-03-04 LAB — RMSF, IGG, IFA

## 2018-03-08 ENCOUNTER — Other Ambulatory Visit: Payer: Self-pay

## 2018-03-08 DIAGNOSIS — I6529 Occlusion and stenosis of unspecified carotid artery: Secondary | ICD-10-CM

## 2018-03-08 DIAGNOSIS — I639 Cerebral infarction, unspecified: Secondary | ICD-10-CM

## 2018-03-08 DIAGNOSIS — I6521 Occlusion and stenosis of right carotid artery: Secondary | ICD-10-CM

## 2018-03-08 DIAGNOSIS — H6062 Unspecified chronic otitis externa, left ear: Secondary | ICD-10-CM | POA: Diagnosis not present

## 2018-03-08 DIAGNOSIS — H6122 Impacted cerumen, left ear: Secondary | ICD-10-CM | POA: Diagnosis not present

## 2018-03-09 ENCOUNTER — Other Ambulatory Visit: Payer: Self-pay | Admitting: Family Medicine

## 2018-03-17 DIAGNOSIS — R030 Elevated blood-pressure reading, without diagnosis of hypertension: Secondary | ICD-10-CM | POA: Diagnosis not present

## 2018-03-17 DIAGNOSIS — M48062 Spinal stenosis, lumbar region with neurogenic claudication: Secondary | ICD-10-CM | POA: Diagnosis not present

## 2018-03-17 DIAGNOSIS — M4306 Spondylolysis, lumbar region: Secondary | ICD-10-CM | POA: Diagnosis not present

## 2018-03-17 DIAGNOSIS — M4156 Other secondary scoliosis, lumbar region: Secondary | ICD-10-CM | POA: Diagnosis not present

## 2018-03-18 ENCOUNTER — Other Ambulatory Visit: Payer: Self-pay | Admitting: Neurological Surgery

## 2018-03-25 ENCOUNTER — Other Ambulatory Visit (HOSPITAL_COMMUNITY): Payer: Self-pay | Admitting: Neurological Surgery

## 2018-03-25 DIAGNOSIS — M48062 Spinal stenosis, lumbar region with neurogenic claudication: Secondary | ICD-10-CM

## 2018-04-09 NOTE — Pre-Procedure Instructions (Signed)
Jacob Conner  04/09/2018      [name] - [place], [place] [address] Phone: [phone] Fax: [phone]    Your procedure is scheduled on Tuesday October 1.  Report to [facility] Admitting at 5:30 A.M.  Call this number if you have problems the morning of surgery:  [phone]   Remember:  Do not eat or drink after midnight.    Take these medicines the morning of surgery with A SIP OF WATER:   Finasteride (proscar) Methimazole (tapazole)  7 days prior to surgery STOP taking any Aspirin(unless otherwise instructed by your surgeon), Aleve, Naproxen, Ibuprofen, Motrin, Advil, Goody's, BC's, all herbal medications, fish oil, and all vitamins     Do not wear jewelry, make-up or nail polish.  Do not wear lotions, powders, or perfumes, or deodorant.  Do not shave 48 hours prior to surgery.  Men may shave face and neck.  Do not bring valuables to the hospital.  [facility] is not responsible for any belongings or valuables.  Contacts, dentures or bridgework may not be worn into surgery.  Leave your suitcase in the car.  After surgery it may be brought to your room.  For patients admitted to the hospital, discharge time will be determined by your treatment team.  Patients discharged the day of surgery will not be allowed to drive home.   Special instructions:  \ [place]- Preparing For Surgery  Before surgery, you can play an important role. Because skin is not sterile, your skin needs to be as free of germs as possible. You can reduce the number of germs on your skin by washing with CHG (chlorahexidine gluconate) Soap before surgery.  CHG is an antiseptic cleaner which kills germs and bonds with the skin to continue killing germs even after washing.    Oral Hygiene is also important to reduce your risk of infection.  Remember - BRUSH YOUR TEETH THE MORNING OF SURGERY WITH YOUR REGULAR  TOOTHPASTE  Please do not use if you have an allergy to CHG or antibacterial soaps. If your skin becomes reddened/irritated stop using the CHG.  Do not shave (including legs and underarms) for at least 48 hours prior to first CHG shower. It is OK to shave your face.  Please follow these instructions carefully.   1. Shower the NIGHT BEFORE SURGERY and the MORNING OF SURGERY with CHG.   2. If you chose to wash your hair, wash your hair first as usual with your normal shampoo.  3. After you shampoo, rinse your hair and body thoroughly to remove the shampoo.  4. Use CHG as you would any other liquid soap. You can apply CHG directly to the skin and wash gently with a scrungie or a clean washcloth.   5. Apply the CHG Soap to your body ONLY FROM THE NECK DOWN.  Do not use on open wounds or open sores. Avoid contact with your eyes, ears, mouth and genitals (private parts). Wash Face and genitals (private parts)  with your normal soap.  6. Wash thoroughly, paying special attention to the area where your surgery will be performed.  7. Thoroughly rinse your body with warm water from the neck down.  8. DO NOT shower/wash with your normal soap after using and rinsing off the CHG Soap.  9. Pat yourself dry with a CLEAN TOWEL.  10. Wear CLEAN PAJAMAS to bed the night before  surgery, wear comfortable clothes the morning of surgery  11. Place CLEAN SHEETS on your bed the night of your first shower and DO NOT SLEEP WITH PETS.    Day of Surgery:  Do not apply any deodorants/lotions.  Please wear clean clothes to the hospital/surgery center.   Remember to brush your teeth WITH YOUR REGULAR TOOTHPASTE.    Please read over the following fact sheets that you were given. Coughing and Deep Breathing, MRSA Information and Surgical Site Infection Prevention

## 2018-04-12 ENCOUNTER — Encounter (HOSPITAL_COMMUNITY)
Admission: RE | Admit: 2018-04-12 | Discharge: 2018-04-12 | Disposition: A | Discharge status: Home / Self Care | Payer: Medicare Other | Source: Ambulatory Visit | Attending: Neurological Surgery | Admitting: Neurological Surgery

## 2018-04-12 ENCOUNTER — Encounter (HOSPITAL_COMMUNITY): Payer: Self-pay

## 2018-04-12 ENCOUNTER — Other Ambulatory Visit: Payer: Self-pay

## 2018-04-12 DIAGNOSIS — N200 Calculus of kidney: Secondary | ICD-10-CM | POA: Diagnosis not present

## 2018-04-12 DIAGNOSIS — M5136 Other intervertebral disc degeneration, lumbar region: Secondary | ICD-10-CM | POA: Diagnosis not present

## 2018-04-12 DIAGNOSIS — M48062 Spinal stenosis, lumbar region with neurogenic claudication: Secondary | ICD-10-CM | POA: Diagnosis not present

## 2018-04-12 DIAGNOSIS — M4186 Other forms of scoliosis, lumbar region: Secondary | ICD-10-CM | POA: Diagnosis not present

## 2018-04-12 DIAGNOSIS — Z01812 Encounter for preprocedural laboratory examination: Secondary | ICD-10-CM | POA: Insufficient documentation

## 2018-04-12 LAB — BASIC METABOLIC PANEL
ANION GAP: 10 (ref 5–15)
BUN: 20 mg/dL (ref 8–23)
CALCIUM: 9.8 mg/dL (ref 8.9–10.3)
CO2: 30 mmol/L (ref 22–32)
Chloride: 100 mmol/L (ref 98–111)
Creatinine, Ser: 1.11 mg/dL (ref 0.61–1.24)
Glucose, Bld: 95 mg/dL (ref 70–99)
Potassium: 3.9 mmol/L (ref 3.5–5.1)
SODIUM: 140 mmol/L (ref 135–145)

## 2018-04-12 LAB — CBC
HCT: 44.7 % (ref 39.0–52.0)
Hemoglobin: 14.8 g/dL (ref 13.0–17.0)
MCH: 30.5 pg (ref 26.0–34.0)
MCHC: 33.1 g/dL (ref 30.0–36.0)
MCV: 92.2 fL (ref 78.0–100.0)
PLATELETS: 318 10*3/uL (ref 150–400)
RBC: 4.85 MIL/uL (ref 4.22–5.81)
RDW: 11.9 % (ref 11.5–15.5)
WBC: 6.8 10*3/uL (ref 4.0–10.5)

## 2018-04-12 LAB — TYPE AND SCREEN
ABO/RH(D): O POS
ANTIBODY SCREEN: NEGATIVE

## 2018-04-12 LAB — SURGICAL PCR SCREEN
MRSA, PCR: NEGATIVE
STAPHYLOCOCCUS AUREUS: NEGATIVE

## 2018-04-12 NOTE — Progress Notes (Signed)
PCP is Dr. Patricia Nettle. [place]  [place] 01/2018 Endocrinologist is Dr. Nicoletta Dress  [place] 01/2018  (for graves disease) Is in test trial for a new cholesterol med. Will take his last dose of aspirin 9/24 per the surgeon's instructions Denies murmur, cp, sob.  Does have doe however.

## 2018-04-12 NOTE — Pre-Procedure Instructions (Signed)
Jacob Conner  04/12/2018      [name] - [place], [place] [address] Phone: [phone] Fax: [phone]    Your procedure is scheduled on Tuesday October 1.   Report to [facility] Admitting at 5:30 A.M.             (posted surgery time 7:30a - 1:58p)   Call this number if you have problems the morning of surgery:  [phone]   Remember:   Do not eat or drink after midnight, Monday.    Take these medicines the morning of surgery with A SIP OF WATER:   Finasteride (proscar) Methimazole (tapazole)  7 days prior to surgery STOP taking any Aspirin(unless otherwise instructed by your surgeon), Aleve, Naproxen, Ibuprofen, Motrin, Advil, Goody's, BC's, all herbal medications, fish oil, and all vitamins     Do not wear jewelry - NO rings or watches.  Do not wear lotions, colognes  or deodorant.   Men may shave face and neck.  Do not bring valuables to the hospital.  [facility] is not responsible for any belongings or valuables.  Contacts, dentures or bridgework may not be worn into surgery.  Leave your suitcase in the car.  After surgery it may be brought to your room.  For patients admitted to the hospital, discharge time will be determined by your treatment team.    Special instructions:  [place]- Preparing For Surgery  Before surgery, you can play an important role. Because skin is not sterile, your skin needs to be as free of germs as possible. You can reduce the number of germs on your skin by washing with CHG (chlorahexidine gluconate) Soap before surgery.  CHG is an antiseptic cleaner which kills germs and bonds with the skin to continue killing germs even after washing.    Oral Hygiene is also important to reduce your risk of infection.    Remember - BRUSH YOUR TEETH THE MORNING OF SURGERY WITH YOUR REGULAR TOOTHPASTE  Please do not use if you have an allergy to CHG or antibacterial  soaps. If your skin becomes reddened/irritated stop using the CHG.  Do not shave (including legs and underarms) for at least 48 hours prior to first CHG shower. It is OK to shave your face.  Please follow these instructions carefully.   1. Shower the NIGHT BEFORE SURGERY and the MORNING OF SURGERY with CHG.   2. If you chose to wash your hair, wash your hair first as usual with your normal shampoo.  3. After you shampoo, rinse your hair and body thoroughly to remove the shampoo.  4. Use CHG as you would any other liquid soap. You can apply CHG directly to the skin and wash gently with a scrungie or a clean washcloth.   5. Apply the CHG Soap to your body ONLY FROM THE NECK DOWN.  Do not use on open wounds or open sores. Avoid contact with your eyes, ears, mouth and genitals (private parts). Wash Face and genitals (private parts)  with your normal soap.  6. Wash thoroughly, paying special attention to the area where your surgery will be performed.  7. Thoroughly rinse your body with warm water from the neck down.  8. DO NOT shower/wash with your normal soap after using and rinsing off the CHG Soap.  9. Pat yourself dry with a CLEAN TOWEL.  10. Wear CLEAN PAJAMAS to bed the  night before surgery, wear comfortable clothes the morning of surgery  11. Place CLEAN SHEETS on your bed the night of your first shower and DO NOT SLEEP WITH PETS.  Day of Surgery:  Do not apply any deodorants/lotions.  Please wear clean clothes to the hospital/surgery center.   Remember to brush your teeth WITH YOUR REGULAR TOOTHPASTE.   Please read over the following fact sheets that you were given. MRSA Information and Surgical Site Infection Prevention

## 2018-04-13 ENCOUNTER — Ambulatory Visit (HOSPITAL_COMMUNITY)
Admission: RE | Admit: 2018-04-13 | Discharge: 2018-04-13 | Disposition: A | Discharge status: Home / Self Care | Payer: Medicare Other | Source: Ambulatory Visit | Attending: Neurological Surgery | Admitting: Neurological Surgery

## 2018-04-13 DIAGNOSIS — N200 Calculus of kidney: Secondary | ICD-10-CM | POA: Insufficient documentation

## 2018-04-13 DIAGNOSIS — M5136 Other intervertebral disc degeneration, lumbar region: Secondary | ICD-10-CM | POA: Insufficient documentation

## 2018-04-13 DIAGNOSIS — M48062 Spinal stenosis, lumbar region with neurogenic claudication: Secondary | ICD-10-CM | POA: Diagnosis not present

## 2018-04-13 DIAGNOSIS — M4186 Other forms of scoliosis, lumbar region: Secondary | ICD-10-CM | POA: Insufficient documentation

## 2018-04-20 ENCOUNTER — Inpatient Hospital Stay (HOSPITAL_COMMUNITY): Payer: Medicare Other | Admitting: Anesthesiology

## 2018-04-20 ENCOUNTER — Inpatient Hospital Stay (HOSPITAL_COMMUNITY): Payer: Medicare Other

## 2018-04-20 ENCOUNTER — Encounter (HOSPITAL_COMMUNITY)
Admission: RE | Disposition: A | Discharge status: Home / Self Care | Payer: Self-pay | Source: Ambulatory Visit | Attending: Neurological Surgery

## 2018-04-20 ENCOUNTER — Encounter (HOSPITAL_COMMUNITY): Payer: Self-pay | Admitting: *Deleted

## 2018-04-20 ENCOUNTER — Inpatient Hospital Stay (HOSPITAL_COMMUNITY): Payer: Medicare Other | Admitting: Physician Assistant

## 2018-04-20 ENCOUNTER — Other Ambulatory Visit: Payer: Self-pay

## 2018-04-20 ENCOUNTER — Inpatient Hospital Stay (HOSPITAL_COMMUNITY)
Admission: RE | Admit: 2018-04-20 | Discharge: 2018-04-22 | DRG: 458 | Disposition: A | Discharge status: Home / Self Care | Payer: Medicare Other | Source: Ambulatory Visit | Attending: Neurological Surgery | Admitting: Neurological Surgery

## 2018-04-20 DIAGNOSIS — M48062 Spinal stenosis, lumbar region with neurogenic claudication: Secondary | ICD-10-CM | POA: Diagnosis not present

## 2018-04-20 DIAGNOSIS — Z79899 Other long term (current) drug therapy: Secondary | ICD-10-CM | POA: Diagnosis not present

## 2018-04-20 DIAGNOSIS — Z7982 Long term (current) use of aspirin: Secondary | ICD-10-CM | POA: Diagnosis not present

## 2018-04-20 DIAGNOSIS — M4156 Other secondary scoliosis, lumbar region: Principal | ICD-10-CM | POA: Diagnosis present

## 2018-04-20 DIAGNOSIS — M4326 Fusion of spine, lumbar region: Secondary | ICD-10-CM | POA: Diagnosis not present

## 2018-04-20 DIAGNOSIS — E785 Hyperlipidemia, unspecified: Secondary | ICD-10-CM | POA: Diagnosis not present

## 2018-04-20 DIAGNOSIS — M47816 Spondylosis without myelopathy or radiculopathy, lumbar region: Secondary | ICD-10-CM | POA: Diagnosis not present

## 2018-04-20 DIAGNOSIS — M4726 Other spondylosis with radiculopathy, lumbar region: Secondary | ICD-10-CM | POA: Diagnosis present

## 2018-04-20 DIAGNOSIS — M419 Scoliosis, unspecified: Secondary | ICD-10-CM | POA: Diagnosis present

## 2018-04-20 DIAGNOSIS — Z981 Arthrodesis status: Secondary | ICD-10-CM | POA: Diagnosis not present

## 2018-04-20 DIAGNOSIS — M48061 Spinal stenosis, lumbar region without neurogenic claudication: Secondary | ICD-10-CM | POA: Diagnosis present

## 2018-04-20 DIAGNOSIS — Z419 Encounter for procedure for purposes other than remedying health state, unspecified: Secondary | ICD-10-CM

## 2018-04-20 DIAGNOSIS — E0590 Thyrotoxicosis, unspecified without thyrotoxic crisis or storm: Secondary | ICD-10-CM | POA: Diagnosis not present

## 2018-04-20 DIAGNOSIS — M4186 Other forms of scoliosis, lumbar region: Secondary | ICD-10-CM | POA: Diagnosis not present

## 2018-04-20 HISTORY — DX: Gastro-esophageal reflux disease without esophagitis: K21.9

## 2018-04-20 HISTORY — DX: Malignant melanoma of skin, unspecified: C43.9

## 2018-04-20 HISTORY — DX: Other seasonal allergic rhinitis: J30.2

## 2018-04-20 HISTORY — PX: LUMBAR PERCUTANEOUS PEDICLE SCREW 3 LEVEL: SHX5562

## 2018-04-20 HISTORY — PX: APPLICATION OF ROBOTIC ASSISTANCE FOR SPINAL PROCEDURE: SHX6753

## 2018-04-20 HISTORY — DX: Thyrotoxicosis with diffuse goiter without thyrotoxic crisis or storm: E05.00

## 2018-04-20 HISTORY — PX: ANTERIOR LAT LUMBAR FUSION: SHX1168

## 2018-04-20 SURGERY — ANTERIOR LATERAL LUMBAR FUSION 3 LEVELS
Anesthesia: General | Site: Spine Lumbar

## 2018-04-20 MED ORDER — WHITE PETROLATUM EX OINT
TOPICAL_OINTMENT | CUTANEOUS | Status: AC
  Filled 2018-04-20: qty 28.35

## 2018-04-20 MED ORDER — ONDANSETRON HCL 4 MG/2ML IJ SOLN: 4.0000 mg | Freq: Four times a day (QID) | INTRAMUSCULAR | Status: DC | PRN

## 2018-04-20 MED ORDER — METHIMAZOLE 5 MG PO TABS
5.0000 mg | ORAL_TABLET | Freq: Every day | ORAL | Status: DC
  Administered 2018-04-21 – 2018-04-22 (×2): 5 mg via ORAL
  Filled 2018-04-20 (×2): qty 1

## 2018-04-20 MED ORDER — THROMBIN 5000 UNITS EX SOLR
CUTANEOUS | Status: AC
  Filled 2018-04-20: qty 5000

## 2018-04-20 MED ORDER — FENTANYL CITRATE (PF) 250 MCG/5ML IJ SOLN
INTRAMUSCULAR | Status: AC
  Filled 2018-04-20: qty 5

## 2018-04-20 MED ORDER — HYDROMORPHONE HCL 1 MG/ML IJ SOLN
INTRAMUSCULAR | Status: AC
  Filled 2018-04-20: qty 1

## 2018-04-20 MED ORDER — MENTHOL 3 MG MT LOZG
1.0000 | LOZENGE | OROMUCOSAL | Status: DC | PRN
  Filled 2018-04-20: qty 9

## 2018-04-20 MED ORDER — SODIUM CHLORIDE 0.9 % IV SOLN: 250.0000 mL | INTRAVENOUS | Status: DC

## 2018-04-20 MED ORDER — ALUM & MAG HYDROXIDE-SIMETH 200-200-20 MG/5ML PO SUSP: 30.0000 mL | Freq: Four times a day (QID) | ORAL | Status: DC | PRN

## 2018-04-20 MED ORDER — ONDANSETRON HCL 4 MG/2ML IJ SOLN
INTRAMUSCULAR | Status: AC
  Filled 2018-04-20: qty 2

## 2018-04-20 MED ORDER — LIDOCAINE 2% (20 MG/ML) 5 ML SYRINGE
INTRAMUSCULAR | Status: DC | PRN
  Administered 2018-04-20: 80 mg via INTRAVENOUS

## 2018-04-20 MED ORDER — LACTATED RINGERS IV SOLN
INTRAVENOUS | Status: DC | PRN
  Administered 2018-04-20: 07:00:00 via INTRAVENOUS

## 2018-04-20 MED ORDER — OXYCODONE HCL 5 MG PO TABS: 5.0000 mg | ORAL_TABLET | Freq: Once | ORAL | Status: DC | PRN

## 2018-04-20 MED ORDER — LIDOCAINE 2% (20 MG/ML) 5 ML SYRINGE
INTRAMUSCULAR | Status: AC
  Filled 2018-04-20: qty 5

## 2018-04-20 MED ORDER — SODIUM CHLORIDE 0.9 % IV SOLN
INTRAVENOUS | Status: DC | PRN
  Administered 2018-04-20: 60 ug/min via INTRAVENOUS

## 2018-04-20 MED ORDER — CEFAZOLIN SODIUM-DEXTROSE 2-4 GM/100ML-% IV SOLN
2.0000 g | INTRAVENOUS | Status: AC
  Administered 2018-04-20: 2 g via INTRAVENOUS
  Filled 2018-04-20: qty 100

## 2018-04-20 MED ORDER — MORPHINE SULFATE (PF) 2 MG/ML IV SOLN
2.0000 mg | INTRAVENOUS | Status: DC | PRN
  Administered 2018-04-20 – 2018-04-21 (×3): 2 mg via INTRAVENOUS
  Filled 2018-04-20 (×3): qty 1

## 2018-04-20 MED ORDER — PROPOFOL 10 MG/ML IV BOLUS
INTRAVENOUS | Status: DC | PRN
  Administered 2018-04-20: 150 mg via INTRAVENOUS

## 2018-04-20 MED ORDER — OXYCODONE HCL 5 MG/5ML PO SOLN: 5.0000 mg | Freq: Once | ORAL | Status: DC | PRN

## 2018-04-20 MED ORDER — SODIUM CHLORIDE 0.9 % IV SOLN
INTRAVENOUS | Status: DC | PRN
  Administered 2018-04-20: 500 mL

## 2018-04-20 MED ORDER — DEXAMETHASONE SODIUM PHOSPHATE 10 MG/ML IJ SOLN
INTRAMUSCULAR | Status: AC
  Filled 2018-04-20: qty 1

## 2018-04-20 MED ORDER — EPHEDRINE 5 MG/ML INJ
INTRAVENOUS | Status: AC
  Filled 2018-04-20: qty 10

## 2018-04-20 MED ORDER — ACETAMINOPHEN 650 MG RE SUPP: 650.0000 mg | RECTAL | Status: DC | PRN

## 2018-04-20 MED ORDER — FINASTERIDE 5 MG PO TABS
5.0000 mg | ORAL_TABLET | Freq: Every day | ORAL | Status: DC
  Administered 2018-04-21 – 2018-04-22 (×2): 5 mg via ORAL
  Filled 2018-04-20 (×2): qty 1

## 2018-04-20 MED ORDER — ONDANSETRON HCL 4 MG PO TABS
4.0000 mg | ORAL_TABLET | Freq: Four times a day (QID) | ORAL | Status: DC | PRN
  Administered 2018-04-20: 4 mg via ORAL
  Filled 2018-04-20: qty 1

## 2018-04-20 MED ORDER — SODIUM CHLORIDE 0.9% FLUSH
3.0000 mL | Freq: Two times a day (BID) | INTRAVENOUS | Status: DC
  Administered 2018-04-21 – 2018-04-22 (×3): 3 mL via INTRAVENOUS

## 2018-04-20 MED ORDER — METHOCARBAMOL 500 MG PO TABS
500.0000 mg | ORAL_TABLET | Freq: Four times a day (QID) | ORAL | Status: DC | PRN
  Administered 2018-04-20: 500 mg via ORAL
  Filled 2018-04-20: qty 1

## 2018-04-20 MED ORDER — ONDANSETRON HCL 4 MG/2ML IJ SOLN
INTRAMUSCULAR | Status: DC | PRN
  Administered 2018-04-20: 4 mg via INTRAVENOUS

## 2018-04-20 MED ORDER — METHOCARBAMOL 1000 MG/10ML IJ SOLN
500.0000 mg | Freq: Four times a day (QID) | INTRAVENOUS | Status: DC | PRN
  Filled 2018-04-20: qty 5

## 2018-04-20 MED ORDER — HYDROMORPHONE HCL 1 MG/ML IJ SOLN: 0.2500 mg | INTRAMUSCULAR | Status: DC | PRN

## 2018-04-20 MED ORDER — SUCCINYLCHOLINE CHLORIDE 200 MG/10ML IV SOSY
PREFILLED_SYRINGE | INTRAVENOUS | Status: AC
  Filled 2018-04-20: qty 10

## 2018-04-20 MED ORDER — KETOROLAC TROMETHAMINE 15 MG/ML IJ SOLN
7.5000 mg | Freq: Four times a day (QID) | INTRAMUSCULAR | Status: AC
  Administered 2018-04-20 – 2018-04-21 (×4): 7.5 mg via INTRAVENOUS
  Filled 2018-04-20 (×4): qty 1

## 2018-04-20 MED ORDER — CHLORHEXIDINE GLUCONATE CLOTH 2 % EX PADS: 6.0000 | MEDICATED_PAD | Freq: Once | CUTANEOUS | Status: DC

## 2018-04-20 MED ORDER — LIDOCAINE-EPINEPHRINE 1 %-1:100000 IJ SOLN
INTRAMUSCULAR | Status: DC | PRN
  Administered 2018-04-20: 5 mL
  Administered 2018-04-20: 3 mL

## 2018-04-20 MED ORDER — PROPOFOL 10 MG/ML IV BOLUS
INTRAVENOUS | Status: AC
  Filled 2018-04-20: qty 20

## 2018-04-20 MED ORDER — 0.9 % SODIUM CHLORIDE (POUR BTL) OPTIME
TOPICAL | Status: DC | PRN
  Administered 2018-04-20 (×2): 1000 mL

## 2018-04-20 MED ORDER — PHENYLEPHRINE 40 MCG/ML (10ML) SYRINGE FOR IV PUSH (FOR BLOOD PRESSURE SUPPORT)
PREFILLED_SYRINGE | INTRAVENOUS | Status: AC
  Filled 2018-04-20: qty 10

## 2018-04-20 MED ORDER — HYPROMELLOSE (GONIOSCOPIC) 2.5 % OP SOLN: 2.0000 [drp] | OPHTHALMIC | Status: DC | PRN

## 2018-04-20 MED ORDER — OXYCODONE HCL 5 MG PO TABS
ORAL_TABLET | ORAL | Status: AC
  Filled 2018-04-20: qty 1

## 2018-04-20 MED ORDER — FENTANYL CITRATE (PF) 250 MCG/5ML IJ SOLN
INTRAMUSCULAR | Status: DC | PRN
  Administered 2018-04-20: 100 ug via INTRAVENOUS
  Administered 2018-04-20 (×4): 50 ug via INTRAVENOUS
  Administered 2018-04-20: 100 ug via INTRAVENOUS
  Administered 2018-04-20 (×5): 50 ug via INTRAVENOUS

## 2018-04-20 MED ORDER — CEFAZOLIN SODIUM-DEXTROSE 2-4 GM/100ML-% IV SOLN
2.0000 g | Freq: Three times a day (TID) | INTRAVENOUS | Status: AC
  Administered 2018-04-20 – 2018-04-21 (×2): 2 g via INTRAVENOUS
  Filled 2018-04-20 (×2): qty 100

## 2018-04-20 MED ORDER — BUPIVACAINE HCL (PF) 0.5 % IJ SOLN
INTRAMUSCULAR | Status: AC
  Filled 2018-04-20: qty 30

## 2018-04-20 MED ORDER — HYDROCODONE-ACETAMINOPHEN 5-325 MG PO TABS
1.0000 | ORAL_TABLET | ORAL | Status: DC | PRN
  Administered 2018-04-20 – 2018-04-22 (×4): 1 via ORAL
  Filled 2018-04-20 (×4): qty 1

## 2018-04-20 MED ORDER — ACETAMINOPHEN 325 MG PO TABS
650.0000 mg | ORAL_TABLET | ORAL | Status: DC | PRN
  Administered 2018-04-20 – 2018-04-21 (×2): 650 mg via ORAL
  Filled 2018-04-20 (×2): qty 2

## 2018-04-20 MED ORDER — LIDOCAINE-EPINEPHRINE 1 %-1:100000 IJ SOLN
INTRAMUSCULAR | Status: AC
  Filled 2018-04-20: qty 1

## 2018-04-20 MED ORDER — PHENYLEPHRINE HCL 10 MG/ML IJ SOLN
INTRAMUSCULAR | Status: DC | PRN
  Administered 2018-04-20 (×4): 120 ug via INTRAVENOUS
  Administered 2018-04-20: 80 ug via INTRAVENOUS

## 2018-04-20 MED ORDER — LACTATED RINGERS IV SOLN
INTRAVENOUS | Status: DC
  Administered 2018-04-20: 125 mL via INTRAVENOUS
  Administered 2018-04-21 – 2018-04-22 (×4): via INTRAVENOUS

## 2018-04-20 MED ORDER — THROMBIN 5000 UNITS EX SOLR
OROMUCOSAL | Status: DC | PRN
  Administered 2018-04-20: 5 mL via TOPICAL

## 2018-04-20 MED ORDER — SUCCINYLCHOLINE CHLORIDE 20 MG/ML IJ SOLN
INTRAMUSCULAR | Status: DC | PRN
  Administered 2018-04-20: 120 mg via INTRAVENOUS

## 2018-04-20 MED ORDER — PHENOL 1.4 % MT LIQD: 1.0000 | OROMUCOSAL | Status: DC | PRN

## 2018-04-20 MED ORDER — SODIUM CHLORIDE 0.9% FLUSH: 3.0000 mL | INTRAVENOUS | Status: DC | PRN

## 2018-04-20 MED ORDER — DEXAMETHASONE SODIUM PHOSPHATE 10 MG/ML IJ SOLN
INTRAMUSCULAR | Status: DC | PRN
  Administered 2018-04-20: 10 mg via INTRAVENOUS

## 2018-04-20 MED ORDER — EPHEDRINE SULFATE 50 MG/ML IJ SOLN
INTRAMUSCULAR | Status: DC | PRN
  Administered 2018-04-20: 15 mg via INTRAVENOUS
  Administered 2018-04-20: 10 mg via INTRAVENOUS
  Administered 2018-04-20: 15 mg via INTRAVENOUS
  Administered 2018-04-20: 10 mg via INTRAVENOUS

## 2018-04-20 MED ORDER — FUROSEMIDE 20 MG PO TABS
20.0000 mg | ORAL_TABLET | ORAL | Status: DC
  Administered 2018-04-21: 20 mg via ORAL
  Filled 2018-04-20 (×2): qty 1

## 2018-04-20 MED ORDER — BUPIVACAINE HCL (PF) 0.5 % IJ SOLN
INTRAMUSCULAR | Status: DC | PRN
  Administered 2018-04-20: 3 mL
  Administered 2018-04-20: 40 mL
  Administered 2018-04-20: 5 mL

## 2018-04-20 SURGICAL SUPPLY — 86 items
BAG DECANTER FOR FLEXI CONT (MISCELLANEOUS) ×4 IMPLANT
BIT DRILL LONG 3.0X30 (BIT) ×3 IMPLANT
BIT DRILL LONG 3.0X30MM (BIT) ×1
BIT DRILL LONG 3X80 (BIT) IMPLANT
BIT DRILL LONG 3X80MM (BIT)
BIT DRILL LONG 4X80 (BIT) IMPLANT
BIT DRILL LONG 4X80MM (BIT)
BIT DRILL SHORT 3.0X30 (BIT) ×3 IMPLANT
BIT DRILL SHORT 3.0X30MM (BIT) ×1
BIT DRILL SHORT 3X80 (BIT) IMPLANT
BIT DRILL SHORT 3X80MM (BIT)
BLADE CLIPPER SURG (BLADE) ×8 IMPLANT
BONE MATRIX OSTEOCEL PRO MED (Bone Implant) ×12 IMPLANT
CARTRIDGE OIL MAESTRO DRILL (MISCELLANEOUS) ×2 IMPLANT
CLIP NEUROVISION LG (CLIP) ×4 IMPLANT
CONT SPEC 4OZ CLIKSEAL STRL BL (MISCELLANEOUS) ×4 IMPLANT
COVER BACK TABLE 60X90IN (DRAPES) IMPLANT
COVER WAND RF STERILE (DRAPES) IMPLANT
DERMABOND ADVANCED (GAUZE/BANDAGES/DRESSINGS) ×4
DERMABOND ADVANCED .7 DNX12 (GAUZE/BANDAGES/DRESSINGS) ×4 IMPLANT
DIFFUSER DRILL AIR PNEUMATIC (MISCELLANEOUS) ×4 IMPLANT
DRAPE C-ARM 42X72 X-RAY (DRAPES) ×8 IMPLANT
DRAPE C-ARMOR (DRAPES) ×8 IMPLANT
DRAPE LAPAROTOMY 100X72X124 (DRAPES) ×8 IMPLANT
DRAPE POUCH INSTRU U-SHP 10X18 (DRAPES) IMPLANT
DRAPE SHEET LG 3/4 BI-LAMINATE (DRAPES) ×4 IMPLANT
DURAPREP 26ML APPLICATOR (WOUND CARE) ×8 IMPLANT
ELECT REM PT RETURN 9FT ADLT (ELECTROSURGICAL) ×8
ELECTRODE REM PT RTRN 9FT ADLT (ELECTROSURGICAL) ×4 IMPLANT
GAUZE 4X4 16PLY RFD (DISPOSABLE) IMPLANT
GLOVE BIOGEL PI IND STRL 6.5 (GLOVE) ×4 IMPLANT
GLOVE BIOGEL PI IND STRL 7.0 (GLOVE) ×2 IMPLANT
GLOVE BIOGEL PI IND STRL 7.5 (GLOVE) ×6 IMPLANT
GLOVE BIOGEL PI IND STRL 8.5 (GLOVE) ×4 IMPLANT
GLOVE BIOGEL PI INDICATOR 6.5 (GLOVE) ×4
GLOVE BIOGEL PI INDICATOR 7.0 (GLOVE) ×2
GLOVE BIOGEL PI INDICATOR 7.5 (GLOVE) ×6
GLOVE BIOGEL PI INDICATOR 8.5 (GLOVE) ×4
GLOVE ECLIPSE 6.5 STRL STRAW (GLOVE) ×12 IMPLANT
GLOVE ECLIPSE 8.5 STRL (GLOVE) ×8 IMPLANT
GLOVE ECLIPSE 9.0 STRL (GLOVE) ×4 IMPLANT
GLOVE EXAM NITRILE LRG STRL (GLOVE) IMPLANT
GLOVE EXAM NITRILE XL STR (GLOVE) IMPLANT
GLOVE EXAM NITRILE XS STR PU (GLOVE) IMPLANT
GOWN STRL REUS W/ TWL LRG LVL3 (GOWN DISPOSABLE) ×4 IMPLANT
GOWN STRL REUS W/ TWL XL LVL3 (GOWN DISPOSABLE) ×8 IMPLANT
GOWN STRL REUS W/TWL 2XL LVL3 (GOWN DISPOSABLE) ×8 IMPLANT
GOWN STRL REUS W/TWL LRG LVL3 (GOWN DISPOSABLE) ×4
GOWN STRL REUS W/TWL XL LVL3 (GOWN DISPOSABLE) ×8
GUIDEWIRE NITINOL BEVEL TIP (WIRE) ×36 IMPLANT
HEMOSTAT POWDER KIT SURGIFOAM (HEMOSTASIS) ×4 IMPLANT
KIT BASIN OR (CUSTOM PROCEDURE TRAY) ×8 IMPLANT
KIT DILATOR XLIF 5 (KITS) ×3 IMPLANT
KIT SPINE MAZOR X ROBO DISP (MISCELLANEOUS) ×4 IMPLANT
KIT SURGICAL ACCESS MAXCESS 4 (KITS) ×4 IMPLANT
KIT TURNOVER KIT B (KITS) ×4 IMPLANT
KIT XLIF (KITS) ×1
MARKER SKIN DUAL TIP RULER LAB (MISCELLANEOUS) ×4 IMPLANT
MODULE NVM5 NEXT GEN EMG (NEEDLE) ×4 IMPLANT
MODULUS XLW 10X22X55MM 10 (Spine Construct) ×4 IMPLANT
MODULUS XLW 12X22X55MM 10 (Spine Construct) ×4 IMPLANT
MODULUS XLW 12X22X60MM 10 (Spine Construct) ×4 IMPLANT
NEEDLE HYPO 25X1 1.5 SAFETY (NEEDLE) ×8 IMPLANT
NS IRRIG 1000ML POUR BTL (IV SOLUTION) ×8 IMPLANT
OIL CARTRIDGE MAESTRO DRILL (MISCELLANEOUS) ×4
PACK LAMINECTOMY NEURO (CUSTOM PROCEDURE TRAY) ×8 IMPLANT
PAD ARMBOARD 7.5X6 YLW CONV (MISCELLANEOUS) ×12 IMPLANT
PIN HEAD 2.5X60MM (PIN) IMPLANT
ROD RELINE MAS LORD 5.5X90MM (Rod) ×4 IMPLANT
ROD RELINE MAS LORD 5.5X95MM (Rod) ×4 IMPLANT
SCREW LOCK RELINE 5.5 TULIP (Screw) ×28 IMPLANT
SCREW PA RELINE MAS 5.5X50 C2 (Screw) ×12 IMPLANT
SCREW RELINE MAS 7.5X50MM POLY (Screw) ×16 IMPLANT
SCREW SCHANZ 4MMX80 (MISCELLANEOUS) ×4 IMPLANT
SCREW SCHANZ SA 4.0MM (MISCELLANEOUS) IMPLANT
SPONGE LAP 4X18 RFD (DISPOSABLE) IMPLANT
SPONGE SURGIFOAM ABS GEL SZ50 (HEMOSTASIS) IMPLANT
SUT VIC AB 1 CT1 18XBRD ANBCTR (SUTURE) ×4 IMPLANT
SUT VIC AB 1 CT1 8-18 (SUTURE) ×4
SUT VIC AB 2-0 CP2 18 (SUTURE) ×8 IMPLANT
SUT VIC AB 3-0 SH 8-18 (SUTURE) ×16 IMPLANT
TOWEL GREEN STERILE (TOWEL DISPOSABLE) ×4 IMPLANT
TOWEL GREEN STERILE FF (TOWEL DISPOSABLE) ×8 IMPLANT
TRAY FOLEY MTR SLVR 16FR STAT (SET/KITS/TRAYS/PACK) ×4 IMPLANT
TUBE MAZOR SA REDUCTION (TUBING) ×4 IMPLANT
WATER STERILE IRR 1000ML POUR (IV SOLUTION) ×8 IMPLANT

## 2018-04-20 NOTE — Anesthesia Procedure Notes (Signed)
Procedure Name: Intubation Date/Time: 04/20/2018 7:46 AM Performed by: White, Amedeo Plenty, CRNA Pre-anesthesia Checklist: Patient identified, Emergency Drugs available, Suction available and Patient being monitored Patient Re-evaluated:Patient Re-evaluated prior to induction Oxygen Delivery Method: Circle System Utilized Preoxygenation: Pre-oxygenation with 100% oxygen Induction Type: IV induction Laryngoscope Size: Mac and 4 Grade View: Grade I Tube type: Oral Tube size: 7.5 mm Number of attempts: 1 Airway Equipment and Method: Stylet and Oral airway Placement Confirmation: ETT inserted through vocal cords under direct vision,  positive ETCO2 and breath sounds checked- equal and bilateral Secured at: 22 cm Tube secured with: Tape Dental Injury: Teeth and Oropharynx as per pre-operative assessment

## 2018-04-20 NOTE — Transfer of Care (Signed)
Immediate Anesthesia Transfer of Care Note  Patient: Jacob Conner  Procedure(s) Performed: Lumbar two-three Lumbar three-four Lumbar four-five Anterolateral decompression with posterior percutaneous arthrodesis, MAZOR (Left Spine Lumbar) LUMBAR PERCUTANEOUS PEDICLE SCREW THREE LEVEL (N/A Spine Lumbar) APPLICATION OF ROBOTIC ASSISTANCE FOR SPINAL PROCEDURE (N/A Spine Lumbar)  Patient Location: PACU  Anesthesia Type:General  Level of Consciousness: drowsy and patient cooperative  Airway & Oxygen Therapy: Patient Spontanous Breathing and Patient connected to nasal cannula oxygen  Post-op Assessment: Report given to RN and Post -op Vital signs reviewed and stable  Post vital signs: Reviewed and stable  Last Vitals:  Vitals Value Taken Time  BP 108/62 04/20/2018  1:47 PM  Temp    Pulse 102 04/20/2018  1:49 PM  Resp 21 04/20/2018  1:49 PM  SpO2 94 % 04/20/2018  1:49 PM  Vitals shown include unvalidated device data.  Last Pain:  Vitals:   04/20/18 0609  PainSc: 0-No pain         Complications: No apparent anesthesia complications

## 2018-04-20 NOTE — Anesthesia Preprocedure Evaluation (Signed)
Anesthesia Evaluation  Patient identified by MRN, date of birth, ID band Patient awake    Reviewed: Allergy & Precautions, H&P , NPO status , Patient's Chart, lab work & pertinent test results  Airway Mallampati: II   Neck ROM: full    Dental   Pulmonary shortness of breath,    breath sounds clear to auscultation       Cardiovascular negative cardio ROS   Rhythm:regular Rate:Normal     Neuro/Psych  Headaches,    GI/Hepatic   Endo/Other  Hyperthyroidism   Renal/GU      Musculoskeletal  (+) Arthritis ,   Abdominal   Peds  Hematology   Anesthesia Other Findings   Reproductive/Obstetrics                             Anesthesia Physical Anesthesia Plan  ASA: II  Anesthesia Plan: General   Post-op Pain Management:    Induction: Intravenous  PONV Risk Score and Plan: 2 and Ondansetron, Dexamethasone and Treatment may vary due to age or medical condition  Airway Management Planned: Oral ETT  Additional Equipment:   Intra-op Plan:   Post-operative Plan: Extubation in OR  Informed Consent: I have reviewed the patients History and Physical, chart, labs and discussed the procedure including the risks, benefits and alternatives for the proposed anesthesia with the patient or authorized representative who has indicated his/her understanding and acceptance.     Plan Discussed with: CRNA, Anesthesiologist and Surgeon  Anesthesia Plan Comments:         Anesthesia Quick Evaluation

## 2018-04-20 NOTE — H&P (Signed)
CHIEF COMPLAINT: Back pain progressively worse with generalized weakness.  HISTORY OF PRESENT ILLNESS: [name] is a 77 year old right-handed individual who has had progressively worsening back pain over the past year's time. Here in the last months it has gotten worse and he was ultimately seen and evaluated with an MRI of his lumbar spine.  The study demonstrates that the patient has advanced spondylosis with a degenerative scoliosis most notable at L2-3, 3-4 and 4-5.  There is moderately severe stenosis at L2-3, severe stenosis at L3-4 and L4-5 with bilateral lateral recess stenosis at L4-5.  L5-S1, though showing evidence of spondylosis, demonstrates that the central canal and exiting nerve roots are fairly well preserved.     Today in the office to further his workup, I obtained an AP and lateral flexion-extension film.  These radiographs demonstrate the nature of the scoliosis with an apex to the right side causing significant compromise for the exit foramen on that side.  Lateral flexion-extension films demonstrate that there is marked flattening of the lumbar lordotic curvature.     Clinically, the patient notes that he is back tires very easily.  He can stand and do a little bit of work for a while, but then he needs to sit down or he finds himself further flexing forward.  His wife notes that he stands with a forward stoop.   Physical exam:  on examination, I note that the patient does tend to favor a 10 degree forward stoop in usual position. As he walks, I note that he has a moderately wide-based gait and on further examination of his gait, he demonstrates some proximal weakness in the quadriceps bilaterally and the tibialis anterior and gastrocs also demonstrates some modest weakness. When flexing forward, he tends to straighten to a less than fully erect position.     Review of the films demonstrates the degree of scoliosis that is present and this corroborates well with what I  am observing on the patient's back.  His lumbar spine does appear to be foreshortened and shifted off to the right side.     I have advised that ultimately the patient needs to undergo a decompression and stabilization procedure of the worst levels at L2-3, 3-4 and 4-5.  Given his age and his functional status, I believe that he would be well served with an anterolateral decompression using an XLIF technique to expand the disc spaces at L2-3, L3-4 and L4-5 by placing a transverse spacer through the disc space and then performing fixation using  pedicle screws from L2-L5 with robotic assistance to place those screws, then we would stabilize him in a neutral construct while prone on a Jackson table.  Ultimately, he would still require to heal this process and this is much like healing a 3 level fusion which is what it is.  It is a substantial undertaking, but given the nature of the deformity that is occurring, this is what will be required in order to stabilize this process. [name] would like something definitive done as he would like to maintain as good level of activity as possible and I believe that this is what will be required.  Short-term relief can be had with an external corset, but this is not likely to improve the status any, and I believe that this is a continually progressive deformity.  I did note that [name] had an MRI of his lumbar spine some 10 years ago in 2008 and at that time, he had  no evidence of any significant scoliotic curvature.

## 2018-04-20 NOTE — Op Note (Signed)
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  Degenerative scoliosis Postoperative diagnosis: Same Procedure: Anterolateral decompression L2-3 L3-4 L4-5 using ex lift spacer and allograft for arthrodesis and decompression.  Robotically assisted pedicle screw fixation L2-L3-L4 and L5.  EMG neural monitoring. Surgeon: Kristeen Miss First assistant: Deri Fuelling, MD Anesthesia: General endotracheal Indications: Jacob Conner is a 77 year old individuals had significant back and left lower extremity pain.  He also has advanced spondylitic stenosis at L2-3 L3-4 L4-5 with a degenerative scoliosis.  Is been advised regarding surgical decompression and stabilization with reduction of his scoliosis and indirect decompression of the spinal canal using an ex lift technique.  Procedure: Patient was brought to the operating room supine on the stretcher.  After the smooth induction of general endotracheal anesthesia, he was placed in the right lateral decubitus position after appropriate monitoring electrodes were placed in the major muscle groups of the lower extremities.  Orthogonal positioning was checked radiographically using image guidance.  Then a lateral entry site was marked at L4-5 L3-4 and L2-3.  The skin was prepped with alcohol DuraPrep and draped in a sterile fashion.  Then by making the lateral incisions and a singular posterior incision the retroperitoneal space was entered bluntly with a finger and a narrow dilating tube was guided down to the L4-L5 interspace through the psoas muscle.  EMG monitoring was performed to make sure that none of the nerve roots were in the way.  A series of dilators was then passed all the time continuously monitoring and then 150 mm long retractor was placed over the dilator tubes.  The retractor was secured to the table with a clamp and then a shim was placed into the disc space after it was verified that no neural  elements were in the way.  A large lateral osteophyte was present in this region and this required removal using a combination of osteotomes and rongeurs.  The disc space was entered and a series of dilators and disc shavers were then used to remove the degenerated disc material and open the space.  Then a series of dilators was used to distract the interspace at L4-L5 with ultimately the interspace could be sized to a 14 mm height with a lordotic curvature and a spacer measuring 12 mm in height 22 mm in length and 60 mm in width with 10 degrees of lordosis was placed into the interspace packed with ostia cell.  Attention was then turned to L3-4 were similar decompression was performed again large lateral osteophytes were encountered.  10 x 22 x 55 mm spacer with 10 degrees of lordosis was placed in L3-4 at L2-3 at 12 mm tall spacer with 22 mm in length 55 mm in width and 10 degrees of lordosis was placed.  Once laterally space place screws were positioned final radiographs were obtained in AP and lateral projection and the 3 separate incisions were closed with 2-0 Vicryl in interrupted fashion and 3-0 Vicryl subcuticularly.  Patient was then turned prone onto a Jackson table tenuous neuro monitoring was again employed and after prepping and draping the back with alcohol DuraPrep a guidepin was placed in the left posterior superior iliac crest to which was attached the robotic arm.  Registration radiographs were then obtained in a sterile fashion in the AP and oblique projections and registration to a preoperatively checked and approved plan from L2-L5 was performed.  Then pedicle entry sites were chosen at L2-L3-L4 and L5 using the  robotic arm.  Incisions were first traced out on the patient's back and after infiltrating with lidocaine with epinephrine and some Marcaine the individual incisions were opened.  The robotic arm was then used to assist placement of K wires into the pedicles of L2-L3-L4 and L5  bilaterally once the K wires were placed we placed 5.5 x 50 mm screws at L2 bilaterally at L3 on the right 5.5 x 50 mm screws placed however due to loss of control of the K wire on the left side no screw was placed at L3 7.5 x 50 mm screws were placed in L4 and L5 bilaterally all the time using neuro monitoring to make sure that there was no evidence of a cut out there was noted to be evidence of a cut cut out and some neural irritability on the left side at L3 and that is how we determined that the wire itself had become displaced.  With that a 95 mm rod was placed on the right side between the screws and a 90 mm rod was placed on the left side using a neutral construct final tightening of the screws to the rod was obtained and final radiographs were then obtained in AP and lateral projection demonstrating approximately 50% reduction in the scoliotic curvature.  With this the hardware was removed the pin was removed and the wounds were closed with 2-0 Vicryl in interrupted fashion and 3-0 Vicryl was used subcuticularly Dermabond was placed on the skin.  Total blood loss for the entire procedure was estimated 300 cc.  Patient appeared to tolerate procedure well was returned to recovery room stable condition

## 2018-04-20 NOTE — Progress Notes (Signed)
Remains sleeep and sedated/ npa remains in, seemingly undisturbed by its presence/ vss

## 2018-04-21 ENCOUNTER — Encounter (HOSPITAL_COMMUNITY): Payer: Self-pay | Admitting: Neurological Surgery

## 2018-04-21 LAB — CBC
HCT: 35.2 % — ABNORMAL LOW (ref 39.0–52.0)
Hemoglobin: 11.7 g/dL — ABNORMAL LOW (ref 13.0–17.0)
MCH: 30.2 pg (ref 26.0–34.0)
MCHC: 33.2 g/dL (ref 30.0–36.0)
MCV: 91 fL (ref 78.0–100.0)
PLATELETS: 265 10*3/uL (ref 150–400)
RBC: 3.87 MIL/uL — ABNORMAL LOW (ref 4.22–5.81)
RDW: 12.1 % (ref 11.5–15.5)
WBC: 14.3 10*3/uL — AB (ref 4.0–10.5)

## 2018-04-21 LAB — BASIC METABOLIC PANEL
ANION GAP: 8 (ref 5–15)
BUN: 21 mg/dL (ref 8–23)
CALCIUM: 8.9 mg/dL (ref 8.9–10.3)
CO2: 30 mmol/L (ref 22–32)
Chloride: 98 mmol/L (ref 98–111)
Creatinine, Ser: 1.05 mg/dL (ref 0.61–1.24)
Glucose, Bld: 118 mg/dL — ABNORMAL HIGH (ref 70–99)
POTASSIUM: 4.3 mmol/L (ref 3.5–5.1)
Sodium: 136 mmol/L (ref 135–145)

## 2018-04-21 MED ORDER — DEXAMETHASONE SODIUM PHOSPHATE 10 MG/ML IJ SOLN
4.0000 mg | Freq: Two times a day (BID) | INTRAMUSCULAR | Status: DC
  Administered 2018-04-21 – 2018-04-22 (×2): 4 mg via INTRAVENOUS
  Filled 2018-04-21 (×2): qty 1

## 2018-04-21 MED ORDER — INFLUENZA VAC SPLIT HIGH-DOSE 0.5 ML IM SUSY
0.5000 mL | PREFILLED_SYRINGE | INTRAMUSCULAR | Status: DC
  Filled 2018-04-21: qty 0.5

## 2018-04-21 NOTE — Evaluation (Signed)
Physical Therapy Evaluation Patient Details Name: LAYLA GRAMM MRN: [id number] DOB: March 05, 1941 Today's Date: 04/21/2018   History of Present Illness  Pt is a 77 y/o male s/p anterolateral decompression L2-3, L3-4, L4-5 due to progressively worsening back pain from advanced spondylosis with degenerative scoliosis. PMH significant for but not limited to: stroke, cancer (melanoma), arthritis.     Clinical Impression  Pt presented sitting OOB in recliner chair, awake and willing to participate in therapy session. Prior to admission, pt reported that he was independent with all functional mobility and ADLs. Pt will have 24/7 supervision/assistance upon d/c home. He is currently able to perform transfers with min guard, ambulate in hallway with min guard without an AD and ascend/descend steps with min guard. PT reviewed back precautions with pt throughout. PT also discussed a generalized walking program for pt to initiate upon d/c home. PT will continue to follow pt acutely to progress mobility as tolerated.      Follow Up Recommendations No PT follow up;Supervision - Intermittent    Equipment Recommendations  None recommended by PT    Recommendations for Other Services       Precautions / Restrictions Precautions Precautions: Back;Fall Precaution Comments: pt able to recall 3/3 back precautions Required Braces or Orthoses: Spinal Brace Spinal Brace: Lumbar corset;Applied in sitting position Restrictions Weight Bearing Restrictions: No      Mobility  Bed Mobility Overal bed mobility: Needs Assistance Bed Mobility: Rolling;Sidelying to Sit Rolling: Min guard Sidelying to sit: Min guard       General bed mobility comments: pt OOB in recliner chair upon arrival  Transfers Overall transfer level: Needs assistance Equipment used: None Transfers: Sit to/from Stand Sit to Stand: Min guard         General transfer comment: min guard for safety, steady with  transition  Ambulation/Gait Ambulation/Gait assistance: Min guard Gait Distance (Feet): 200 Feet Assistive device: None Gait Pattern/deviations: Step-through pattern;Decreased stride length Gait velocity: decreased Gait velocity interpretation: 1.31 - 2.62 ft/sec, indicative of limited community ambulator General Gait Details: pt steady without use of an AD, min guard for safety, no LOB or need for physical assistance  Stairs Stairs: Yes Stairs assistance: Min guard Stair Management: Two rails;Alternating pattern;Forwards Number of Stairs: 2(2 standard size, 3 half-size steps) General stair comments: pt steady and cautious, no LOB or need for physical assistance  Wheelchair Mobility    Modified Rankin (Stroke Patients Only)       Balance Overall balance assessment: Mild deficits observed, not formally tested                                           Pertinent Vitals/Pain Pain Assessment: 0-10 Pain Score: 2  Faces Pain Scale: Hurts a little bit Pain Location: back incision Pain Descriptors / Indicators: Discomfort;Grimacing Pain Intervention(s): Monitored during session;Repositioned    Home Living Family/patient expects to be discharged to:: Private residence Living Arrangements: Spouse/significant other;Other relatives;Children Available Help at Discharge: Family;Available 24 hours/day Type of Home: House Home Access: Stairs to enter Entrance Stairs-Rails: Can reach both;Left;Right Entrance Stairs-Number of Steps: 3 Home Layout: Two level;Able to live on main level with bedroom/bathroom Home Equipment: Shower seat;Tub bench;Walker - 2 wheels;Cane - quad;Bedside commode;Grab bars - tub/shower;Grab bars - toilet;Adaptive equipment      Prior Function Level of Independence: Independent         Comments: independent ADLs, IADLs, +  driving, spouse completes medication mgmt      Hand Dominance   Dominant Hand: Right    Extremity/Trunk  Assessment   Upper Extremity Assessment Upper Extremity Assessment: Overall WFL for tasks assessed    Lower Extremity Assessment Lower Extremity Assessment: Overall WFL for tasks assessed    Cervical / Trunk Assessment Cervical / Trunk Assessment: Other exceptions Cervical / Trunk Exceptions: s/p lumbar sx   Communication   Communication: No difficulties  Cognition Arousal/Alertness: Awake/alert Behavior During Therapy: WFL for tasks assessed/performed Overall Cognitive Status: Within Functional Limits for tasks assessed                                        General Comments General comments (skin integrity, edema, etc.): son present and supportive; pt on 2L supplemental oxgyen via [place] initally, removed and maintained saturations     Exercises     Assessment/Plan    PT Assessment Patient needs continued PT services  PT Problem List Decreased balance;Decreased mobility;Decreased coordination;Decreased knowledge of precautions;Pain       PT Treatment Interventions DME instruction;Gait training;Stair training;Functional mobility training;Therapeutic activities;Therapeutic exercise;Balance training;Neuromuscular re-education;Patient/family education    PT Goals (Current goals can be found in the Care Plan section)  Acute Rehab PT Goals Patient Stated Goal: to get home PT Goal Formulation: With patient Time For Goal Achievement: 05/05/18 Potential to Achieve Goals: Good    Frequency Min 5X/week   Barriers to discharge        Co-evaluation               AM-PAC PT "6 Clicks" Daily Activity  Outcome Measure Difficulty turning over in bed (including adjusting bedclothes, sheets and blankets)?: A Little Difficulty moving from lying on back to sitting on the side of the bed? : A Little Difficulty sitting down on and standing up from a chair with arms (e.g., wheelchair, bedside commode, etc,.)?: A Little Help needed moving to and from a bed to chair  (including a wheelchair)?: A Little Help needed walking in hospital room?: A Little Help needed climbing 3-5 steps with a railing? : A Little 6 Click Score: 18    End of Session Equipment Utilized During Treatment: Gait belt;Back brace Activity Tolerance: Patient tolerated treatment well Patient left: in chair;with call bell/phone within reach;with family/visitor present Nurse Communication: Mobility status PT Visit Diagnosis: Other abnormalities of gait and mobility (R26.89);Pain Pain - part of body: (back)    Time: 1660-6301 PT Time Calculation (min) (ACUTE ONLY): 16 min   Charges:   PT Evaluation $PT Eval Moderate Complexity: 1 Mod          Sherie Don, PT, DPT  Acute Rehabilitation Services Pager [phone] Office [place] 04/21/2018, 10:35 AM

## 2018-04-21 NOTE — Evaluation (Signed)
Occupational Therapy Evaluation Patient Details Name: Jacob Conner MRN: [id number] DOB: 1941/03/05 Today's Date: 04/21/2018    History of Present Illness Pt is a 77 y/o male a/p anterolateral decompression L2-3, L3-4, L4-5 due to progressively worsening back pain from advanced spondylosis with degenerative scoliosis. PMH significant for but not limited to: stroke, cancer (melanoma), arthritis.    Clinical Impression   PTA patient independent and driving.  He was admitted for above and limited by pain, decreased activity tolerance, impaired balance, and back precautions.  Patient currently requires setup assist for UB ADL, mod A for LB ADL, min guard for toilet transfer and min guard for grooming standing at sink.  He was educated on brace management and wear schedule, precautions, safety, ADL compensatory techniques, and recommendations.  He has all needed DME at home, and has AE for LB dressing.  Believe patient will progress well with further education on AE for LB self care.  Patient will have 24/7 support of spouse at dc.  Will continue to follow while admitted, but do not anticipate further OT needs after dc home.      Follow Up Recommendations  No OT follow up;Supervision/Assistance - 24 hour    Equipment Recommendations  None recommended by OT    Recommendations for Other Services PT consult     Precautions / Restrictions Precautions Precautions: Back;Fall Precaution Comments: reviewed precautions verbally with patient and son Required Braces or Orthoses: Spinal Brace Spinal Brace: Lumbar corset;Applied in sitting position Restrictions Weight Bearing Restrictions: No      Mobility Bed Mobility Overal bed mobility: Needs Assistance Bed Mobility: Rolling;Sidelying to Sit Rolling: Min guard Sidelying to sit: Min guard       General bed mobility comments: min guard to ensure proper log rolling technique   Transfers Overall transfer level: Needs assistance Equipment  used: Rolling walker (2 wheeled) Transfers: Sit to/from Stand Sit to Stand: Min guard         General transfer comment: min guard for safety and balance, cueing for body mechanics    Balance Overall balance assessment: Mild deficits observed, not formally tested                                         ADL either performed or assessed with clinical judgement   ADL Overall ADL's : Needs assistance/impaired     Grooming: Min guard;Standing Grooming Details (indicate cue type and reason): min guard for safety Upper Body Bathing: Set up;Sitting   Lower Body Bathing: Moderate assistance;Sit to/from stand Lower Body Bathing Details (indicate cue type and reason): unable to complete figure 4 technique, reviewed use of long sponge for bathing seated   Upper Body Dressing : Set up;Sitting   Lower Body Dressing: Moderate assistance;Sit to/from stand;Cueing for compensatory techniques;Cueing for back precautions Lower Body Dressing Details (indicate cue type and reason): unable to complete figure 4 technique, patient educated on compensatory techniques  Toilet Transfer: Min guard;Ambulation;RW(simulated to recliner )           Functional mobility during ADLs: Min guard;Rolling walker General ADL Comments: Patient educated on compensatory techniques and body mechanics for ADl particpation due to back precautions.  Educated on brace management      Vision Baseline Vision/History: Wears glasses Wears Glasses: At all times Patient Visual Report: No change from baseline Vision Assessment?: No apparent visual deficits     Perception  Praxis      Pertinent Vitals/Pain Pain Assessment: Faces Faces Pain Scale: Hurts a little bit Pain Location: back incision Pain Descriptors / Indicators: Discomfort;Grimacing Pain Intervention(s): Limited activity within patient's tolerance;Repositioned     Hand Dominance Right   Extremity/Trunk Assessment Upper Extremity  Assessment Upper Extremity Assessment: Overall WFL for tasks assessed   Lower Extremity Assessment Lower Extremity Assessment: Defer to PT evaluation   Cervical / Trunk Assessment Cervical / Trunk Assessment: Other exceptions Cervical / Trunk Exceptions: s/p lumbar sx    Communication Communication Communication: No difficulties   Cognition Arousal/Alertness: Awake/alert Behavior During Therapy: WFL for tasks assessed/performed Overall Cognitive Status: Within Functional Limits for tasks assessed                                     General Comments  son present and supportive; pt on 2L supplemental oxgyen via [place] initally, removed and maintained saturations     Exercises     Shoulder Instructions      Home Living Family/patient expects to be discharged to:: Private residence Living Arrangements: Spouse/significant other;Other relatives;Children Available Help at Discharge: Family;Available 24 hours/day Type of Home: House Home Access: Stairs to enter [employer] of Steps: 3 Entrance Stairs-Rails: Can reach both;Left;Right Home Layout: Two level;Able to live on main level with bedroom/bathroom     Bathroom Shower/Tub: Teacher, early years/pre: Standard     Home Equipment: Shower seat;Tub bench;Walker - 2 wheels;Cane - quad;Bedside commode;Grab bars - tub/shower;Grab bars - toilet;Adaptive equipment Adaptive Equipment: Reacher;Sock aid;Long-handled shoe horn        Prior Functioning/Environment Level of Independence: Independent        Comments: independent ADLs, IADLs, + driving, spouse completes medication mgmt         OT Problem List: Decreased activity tolerance;Impaired balance (sitting and/or standing);Decreased knowledge of use of DME or AE;Decreased knowledge of precautions;Pain      OT Treatment/Interventions: Self-care/ADL training;Energy conservation;DME and/or AE instruction;Therapeutic activities;Patient/family  education;Balance training    OT Goals(Current goals can be found in the care plan section) Acute Rehab OT Goals Patient Stated Goal: to get home OT Goal Formulation: With patient/family Time For Goal Achievement: 05/05/18 Potential to Achieve Goals: Good  OT Frequency: Min 2X/week   Barriers to D/C:            Co-evaluation              AM-PAC PT "6 Clicks" Daily Activity     Outcome Measure Help from another person eating meals?: None Help from another person taking care of personal grooming?: A Little Help from another person toileting, which includes using toliet, bedpan, or urinal?: A Little Help from another person bathing (including washing, rinsing, drying)?: A Lot Help from another person to put on and taking off regular upper body clothing?: None Help from another person to put on and taking off regular lower body clothing?: A Lot 6 Click Score: 18   End of Session Equipment Utilized During Treatment: Rolling walker;Back brace;Oxygen Nurse Communication: Mobility status  Activity Tolerance: Patient tolerated treatment well Patient left: in chair;with call bell/phone within reach;with family/visitor present  OT Visit Diagnosis: Other abnormalities of gait and mobility (R26.89);Pain Pain - part of body: (back incision)                Time: 8756-4332 OT Time Calculation (min): 25 min Charges:  OT General Charges $OT  Visit: 1 Visit OT Evaluation $OT Eval Moderate Complexity: 1 Mod OT Treatments $Self Care/Home Management : 8-22 mins  Delight Stare, OT Acute Rehabilitation Services Pager [phone] Office [phone]   Delight Stare 04/21/2018, 8:28 AM

## 2018-04-21 NOTE — Anesthesia Postprocedure Evaluation (Signed)
Anesthesia Post Note  Patient: Jacob Conner  Procedure(s) Performed: Lumbar two-three Lumbar three-four Lumbar four-five Anterolateral decompression with posterior percutaneous arthrodesis, MAZOR (Left Spine Lumbar) LUMBAR PERCUTANEOUS PEDICLE SCREW THREE LEVEL (N/A Spine Lumbar) APPLICATION OF ROBOTIC ASSISTANCE FOR SPINAL PROCEDURE (N/A Spine Lumbar)     Patient location during evaluation: PACU Anesthesia Type: General Level of consciousness: awake and alert Pain management: pain level controlled Vital Signs Assessment: post-procedure vital signs reviewed and stable Respiratory status: spontaneous breathing, nonlabored ventilation, respiratory function stable and patient connected to nasal cannula oxygen Cardiovascular status: blood pressure returned to baseline and stable Postop Assessment: no apparent nausea or vomiting Anesthetic complications: no    Last Vitals:  Vitals:   04/21/18 0326 04/21/18 0806  BP: 131/74 135/72  Pulse: 98 90  Resp: 20 20  Temp: 37.1 C 36.8 C  SpO2: 99% 100%    Last Pain:  Vitals:   04/21/18 0806  TempSrc: Oral  PainSc:                  [place] S

## 2018-04-21 NOTE — Progress Notes (Signed)
Patient ID: Jacob Conner, male   DOB: 1941/04/11, 77 y.o.   MRN: [id number] Vital signs are stable Patient had a reasonably well mobile day Ambulating fairly well Now complaining of substantial pain Plan: Decrease IV fluid Add Decadron 4 mg IV twice daily

## 2018-04-22 MED ORDER — METHOCARBAMOL 500 MG PO TABS: 500.0000 mg | ORAL_TABLET | Freq: Four times a day (QID) | ORAL | 3 refills | Status: DC | PRN

## 2018-04-22 MED ORDER — HYDROCODONE-ACETAMINOPHEN 5-325 MG PO TABS: 1.0000 | ORAL_TABLET | ORAL | 0 refills | Status: DC | PRN

## 2018-04-22 MED ORDER — DEXAMETHASONE 1 MG PO TABS: ORAL_TABLET | ORAL | 0 refills | Status: AC

## 2018-04-22 NOTE — Progress Notes (Signed)
Occupational Therapy Treatment Patient Details Name: Jacob Conner MRN: [id number] DOB: 06-29-1941 Today's Date: 04/22/2018    History of present illness Pt is a 77 y/o male s/p anterolateral decompression L2-3, L3-4, L4-5 due to progressively worsening back pain from advanced spondylosis with degenerative scoliosis. PMH significant for but not limited to: stroke, cancer (melanoma), arthritis.    OT comments  Patient participates well.  Demonstrates good recall of back precautions and safety, although reviewed education to maintain wearing brace while seated in chair.  Patient completed functional mobility to tub with supervision, declined completing tub transfer as familiar with TTB transfer prior to admission.  Return demonstrated use of reacher and sock aide with supervision, spouse reporting "oh I'll just help him" and therapist encouraged patient to use equipment at home to help preserve spouses back; pt and spouse agreeable.  Patient has no further questions or concerns.  Plans to dc home today.     Follow Up Recommendations  No OT follow up;Supervision/Assistance - 24 hour    Equipment Recommendations  None recommended by OT    Recommendations for Other Services PT consult    Precautions / Restrictions Precautions Precautions: Back;Fall Precaution Comments: pt able to recall 3/3 back precautions(reviewed wearing brace while sitting in chair) Required Braces or Orthoses: Spinal Brace Spinal Brace: Lumbar corset;Applied in sitting position Restrictions Weight Bearing Restrictions: No       Mobility Bed Mobility Overal bed mobility: Needs Assistance Bed Mobility: Rolling;Sidelying to Sit Rolling: Supervision Sidelying to sit: Supervision       General bed mobility comments: good recall of log roll technique, supervision for safety   Transfers Overall transfer level: Needs assistance Equipment used: None Transfers: Sit to/from Stand Sit to Stand: Supervision          General transfer comment: supervision for safety    Balance Overall balance assessment: Mild deficits observed, not formally tested                                         ADL either performed or assessed with clinical judgement   ADL Overall ADL's : Needs assistance/impaired     Grooming: Standing;Supervision/safety       Lower Body Bathing: Minimal assistance;Sitting/lateral leans;Cueing for compensatory techniques;With adaptive equipment Lower Body Bathing Details (indicate cue type and reason): reviewed compensatory techniques with bathing seated, sequencing with brace management and use of long sponge      Lower Body Dressing: Minimal assistance;Sit to/from stand;Cueing for compensatory techniques;With adaptive equipment Lower Body Dressing Details (indicate cue type and reason): reviewed compensatory techniques, use of AE (reacher/sock aide) and safety; pt return demonstrated use of AE with supervision  Toilet Transfer: Supervision/safety;Ambulation Toilet Transfer Details (indicate cue type and reason): demonstrates good safety and hand placement with transfer simulated to [place] and Hygiene: Supervision/safety;Sit to/from stand Toileting - Clothing Manipulation Details (indicate cue type and reason): using urinal upon entry, supervision   Tub/Shower Transfer Details (indicate cue type and reason): pt and spouse declined practicing transfer, patient plans to use TTB at dc  Functional mobility during ADLs: Supervision/safety General ADL Comments: close supervision for mobility, good recall and adherance to back precautions throughout session.      Vision       Perception     Praxis      Cognition Arousal/Alertness: Awake/alert Behavior During Therapy: WFL for tasks assessed/performed Overall Cognitive Status: Within  Functional Limits for tasks assessed                                           Exercises     Shoulder Instructions       General Comments spouse present and supportive     Pertinent Vitals/ Pain       Pain Assessment: Faces Faces Pain Scale: Hurts a little bit Pain Location: back incision Pain Descriptors / Indicators: Discomfort;Sore Pain Intervention(s): Limited activity within patient's tolerance;Repositioned  Home Living                                          Prior Functioning/Environment              Frequency  Min 2X/week        Progress Toward Goals  OT Goals(current goals can now be found in the care plan section)  Progress towards OT goals: Progressing toward goals  Acute Rehab OT Goals Patient Stated Goal: to get home OT Goal Formulation: With patient/family Time For Goal Achievement: 05/05/18 Potential to Achieve Goals: Good  Plan Discharge plan remains appropriate;Frequency remains appropriate    Co-evaluation                 AM-PAC PT "6 Clicks" Daily Activity     Outcome Measure   Help from another person eating meals?: None Help from another person taking care of personal grooming?: None Help from another person toileting, which includes using toliet, bedpan, or urinal?: A Little Help from another person bathing (including washing, rinsing, drying)?: A Little Help from another person to put on and taking off regular upper body clothing?: None Help from another person to put on and taking off regular lower body clothing?: A Little 6 Click Score: 21    End of Session Equipment Utilized During Treatment: Back brace  OT Visit Diagnosis: Other abnormalities of gait and mobility (R26.89);Pain Pain - part of body: (back incision)   Activity Tolerance Patient tolerated treatment well   Patient Left in chair;with call bell/phone within reach;with family/visitor present   Nurse Communication          Time: 0947-0962 OT Time Calculation (min): 18 min  Charges: OT General Charges $OT  Visit: 1 Visit OT Treatments $Self Care/Home Management : 8-22 mins  Delight Stare, [place] Pager [phone] Office [place] 04/22/2018, 11:19 AM

## 2018-04-22 NOTE — Care Management Note (Signed)
Case Management Note  Patient Details  Name: BRYANN MCNEALY MRN: [id number] Date of Birth: Apr 20, 1941  Subjective/Objective:    Pt s/p:  Lumbar two-three Lumbar three-four Lumbar four-five Anterolateral decompression with posterior percutaneous arthrodesis, MAZOR LUMBAR PERCUTANEOUS PEDICLE SCREW THREE LEVEL  APPLICATION OF ROBOTIC ASSISTANCE FOR SPINAL PROCEDURE. He is from home with his spouse.             Action/Plan: Pt discharging home with self care. PT/OT with no f/u and no DME needs. Pts spouse to provide supervision at home and transportation to home.   Expected Discharge Date:  04/22/18               Expected Discharge Plan:  Home/Self Care  In-House Referral:     Discharge planning Services     Post Acute Care Choice:    Choice offered to:     DME Arranged:    DME Agency:     HH Arranged:    HH Agency:     Status of Service:  Completed, signed off  If discussed at [employer] of Stay Meetings, dates discussed:    Additional Comments:  Pollie Friar, RN 04/22/2018, 10:30 AM

## 2018-04-22 NOTE — Plan of Care (Signed)
Adequate for discharge.

## 2018-04-22 NOTE — Discharge Summary (Signed)
Physician Discharge Summary  Patient ID: Jacob Conner MRN: [id number] DOB/AGE: February 11, 1941 77 y.o.  Admit date: 04/20/2018 Discharge date: 04/22/2018  Admission Diagnoses: Lumbar stenosis with scoliosis L2-L5.  Lumbar radiculopathy, neurogenic claudication  Discharge Diagnoses: Lumbar stenosis with scoliosis L2-L5.  Lumbar radiculopathy, neurogenic claudication Active Problems:   Lumbar spine scoliosis   Discharged Condition: good  Hospital Course: She was admitted to undergo anterolateral decompression from L2-L5 which she tolerated well.  He is ambulatory.  Station and gait are intact.  Consults: None  Significant Diagnostic Studies: None  Treatments: surgery: Anterolateral decompression L2-3 L3-4 L4-5 arthrodesis with allograft pedicle screw fixation with robotic assistance L2-L5  Discharge Exam: Blood pressure (!) 148/80, pulse 99, temperature 99.2 F (37.3 C), temperature source Oral, resp. rate 20, height 5\' 6"  (1.676 m), weight 76.2 kg, SpO2 93 %. Incisions are clean and dry.  Station and gait are intact.  Motor function is intact in the lower extremities.  Disposition: Discharge disposition: 01-Home or Self Care       Discharge Instructions    Call MD for:  redness, tenderness, or signs of infection (pain, swelling, redness, odor or green/yellow discharge around incision site)   Complete by:  As directed    Call MD for:  severe uncontrolled pain   Complete by:  As directed    Call MD for:  temperature >100.4   Complete by:  As directed    Diet - low sodium heart healthy   Complete by:  As directed    Discharge instructions   Complete by:  As directed    Okay to shower. Do not apply salves or appointments to incision. No heavy lifting with the upper extremities greater than 15 pounds. May resume driving when not requiring pain medication and patient feels comfortable with doing so.   Incentive spirometry RT   Complete by:  As directed    Increase activity  slowly   Complete by:  As directed      Allergies as of 04/22/2018      Reactions   Topiramate Other (See Comments)   Cognitive clouding      Medication List    TAKE these medications   AMBULATORY NON FORMULARY MEDICATION Take 180 mg by mouth daily. Medication Name: bempedoic acid 180 mgs vs a placebo, study drug provided   aspirin EC 81 MG tablet Take 81 mg by mouth daily.   dexamethasone 1 MG tablet Commonly known as:  DECADRON 2 tablets twice daily for 2 days, one tablet twice daily for 2 days, one tablet daily for 2 days.   doxycycline 100 MG tablet Commonly known as:  VIBRA-TABS Take 1 tablet (100 mg total) by mouth 2 (two) times daily. 1 po bid   finasteride 5 MG tablet Commonly known as:  PROSCAR TAKE 1 TABLET ONCE DAILY.   furosemide 20 MG tablet Commonly known as:  LASIX TAKE 1 TABLET ONCE DAILY. What changed:  when to take this   HYDROcodone-acetaminophen 5-325 MG tablet Commonly known as:  NORCO/VICODIN Take 1-2 tablets by mouth every 4 (four) hours as needed for severe pain ((score 7 to 10)).   hydroxypropyl methylcellulose / hypromellose 2.5 % ophthalmic solution Commonly known as:  ISOPTO TEARS / GONIOVISC Place 2 drops into both eyes as needed for dry eyes (IRRITATION).   methimazole 5 MG tablet Commonly known as:  TAPAZOLE Take 5 mg by mouth daily.   methocarbamol 500 MG tablet Commonly known as:  ROBAXIN Take 1 tablet (500 mg total)  by mouth every 6 (six) hours as needed for muscle spasms.   Vitamin D (Ergocalciferol) 50000 units Caps capsule Commonly known as:  DRISDOL TAKE 1 CAPSULE BY MOUTH ONCE A WEEK.        SignedEarleen Newport 04/22/2018, 9:10 AM

## 2018-05-07 ENCOUNTER — Encounter (HOSPITAL_COMMUNITY): Payer: Self-pay | Admitting: Neurological Surgery

## 2018-05-07 ENCOUNTER — Other Ambulatory Visit: Payer: Self-pay | Admitting: Family Medicine

## 2018-05-12 ENCOUNTER — Encounter: Payer: Medicare Other | Admitting: *Deleted

## 2018-05-12 VITALS — BP 121/68 | HR 69 | Wt 167.4 lb

## 2018-05-12 DIAGNOSIS — M48062 Spinal stenosis, lumbar region with neurogenic claudication: Secondary | ICD-10-CM | POA: Diagnosis not present

## 2018-05-12 DIAGNOSIS — Z006 Encounter for examination for normal comparison and control in clinical research program: Secondary | ICD-10-CM

## 2018-05-12 NOTE — Research (Signed)
Subject to research clinic for visit T4-M6 in the [employer] Research study.  No cos, aes to report.  Had recent back surgery and will report to sponsor and IRB.  94% compliant with meds and new drug dispensed.  Next phone call and clinic visit scheduled.

## 2018-06-02 ENCOUNTER — Ambulatory Visit (INDEPENDENT_AMBULATORY_CARE_PROVIDER_SITE_OTHER): Payer: Medicare Other

## 2018-06-02 DIAGNOSIS — Z23 Encounter for immunization: Secondary | ICD-10-CM | POA: Diagnosis not present

## 2018-06-23 ENCOUNTER — Other Ambulatory Visit: Payer: Self-pay | Admitting: Nurse Practitioner

## 2018-06-29 ENCOUNTER — Encounter: Payer: Medicare Other | Admitting: *Deleted

## 2018-06-29 DIAGNOSIS — M48062 Spinal stenosis, lumbar region with neurogenic claudication: Secondary | ICD-10-CM | POA: Diagnosis not present

## 2018-07-05 ENCOUNTER — Other Ambulatory Visit: Payer: Self-pay | Admitting: Family Medicine

## 2018-07-16 ENCOUNTER — Other Ambulatory Visit: Payer: Self-pay | Admitting: Family Medicine

## 2018-08-12 ENCOUNTER — Other Ambulatory Visit: Payer: Self-pay | Admitting: Family Medicine

## 2018-08-12 NOTE — Telephone Encounter (Signed)
Last Vit D 10/23/17  34.5

## 2018-08-16 DIAGNOSIS — L570 Actinic keratosis: Secondary | ICD-10-CM | POA: Diagnosis not present

## 2018-08-16 DIAGNOSIS — L819 Disorder of pigmentation, unspecified: Secondary | ICD-10-CM | POA: Diagnosis not present

## 2018-08-17 NOTE — Progress Notes (Signed)
HISTORY AND PHYSICAL     CC:  follow up. Requesting Provider:  Chipper Herb, MD  HPI: This is a 78 y.o. male here for follow up for carotid artery stenosis.  Pt is s/p right CEA by Dr. Donzetta Matters on 07/06/17 for symptomatic carotid artery stenosis.  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  He continued him on his asa and he is returning for his 6 month visit.  He states he has done well since his last visit.  He states he still has some trouble with his words at times, but this has not changed since his last visit.  He denies amaurosis fugax, clumsiness, or hemiparesis.  He states since his CEA, he has not had any migraines.  Says he still has some numbness over his incision.  He does have some pain in his right knee with walking as he says his knee is bone on bone.   He tells me his dad had CABG and believes he had a AAA but not sure if it was ever treated.    He has since undergone anterolateral decompression from L2-L5 in October 2019 by Dr. Ellene Route.  He states he has done well from that surgery.    The pt is not on a statin for cholesterol management.  The pt is not diabetic.   The pt is not on medication for hypertension.   Tobacco hx: He uses Smokeless tobacco.  He has never smoked. The pt is on a daily aspirin.  Pt meds includes: Statin:  No. Beta Blocker:  No. Aspirin:  Yes.   ACEI:  No. ARB:  No. CCB use:  No Other Antiplatelet/Anticoagulant:  No   Past Medical History:  Diagnosis Date  . Arthritis    "pretty much all over; hands, shoulders, knees, back" (04/21/2018)  . BPH (benign prostatic hyperplasia)   . 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 Dyspnea    on exertion  . GERD (gastroesophageal reflux disease)   .  Graves' disease dx'd 2015   "on daily RX" (04/21/2018)  . History of kidney stones   . Hyperlipidemia   . Melanoma ([facility])    "cut off back and arm; total of 4" (04/21/2018)  . Prostate disorder   . Seasonal allergies    "hay fever in spring" (04/21/2018)  . Stroke [facility])    ?,  pt nor family  was not aware of having a stroke/frontal lobe infarct (04/21/2018)  . Thyroid disease    Hyperthyroidism  and Graves    Past Surgical History:  Procedure Laterality Date  . ANTERIOR LAT LUMBAR FUSION Left 04/20/2018   Procedure: Lumbar two-three Lumbar three-four Lumbar four-five Anterolateral decompression with posterior percutaneous arthrodesis, MAZOR;  Surgeon: Kristeen Miss, MD;  Location: [place];  Service: Neurosurgery;  Laterality: Left;  . APPLICATION OF ROBOTIC ASSISTANCE FOR SPINAL PROCEDURE N/A 04/20/2018   Procedure: APPLICATION OF ROBOTIC ASSISTANCE FOR SPINAL PROCEDURE;  Surgeon: Kristeen Miss, MD;  Location: [place];  Service: Neurosurgery;  Laterality: N/A;  . BACK SURGERY    . COLONOSCOPY    . ENDARTERECTOMY Right 07/06/2017   Procedure: ENDARTERECTOMY CAROTID RIGHT;  Surgeon: Waynetta Sandy, MD;  Location: [place];  Service: Vascular;  Laterality: Right;  . ESOPHAGOGASTRODUODENOSCOPY    . HYDROCELE EXCISION    .  LITHOTRIPSY  X 2  . LUMBAR PERCUTANEOUS PEDICLE SCREW 3 LEVEL N/A 04/20/2018   Procedure: LUMBAR PERCUTANEOUS PEDICLE SCREW THREE LEVEL;  Surgeon: Kristeen Miss, MD;  Location: [place];  Service: Neurosurgery;  Laterality: N/A;  . MELANOMA EXCISION  X 4   "back, right arm"  . PATCH ANGIOPLASTY Right 07/06/2017   Procedure: PATCH ANGIOPLASTY RIGHT CAROTID ARTERY;  Surgeon: Waynetta Sandy, MD;  Location: [place];  Service: Vascular;  Laterality: Right;    Allergies  Allergen Reactions  . Topiramate Other (See Comments)    Cognitive clouding     Current Outpatient Medications  Medication Sig Dispense Refill  . AMBULATORY NON FORMULARY MEDICATION Take 180 mg  by mouth daily. Medication Name: bempedoic acid 180 mgs vs a placebo, study drug provided    . aspirin EC 81 MG tablet Take 81 mg by mouth daily.    Marland Kitchen dexamethasone (DECADRON) 1 MG tablet 2 tablets twice daily for 2 days, one tablet twice daily for 2 days, one tablet daily for 2 days. 15 tablet 0  . doxycycline (VIBRA-TABS) 100 MG tablet Take 1 tablet (100 mg total) by mouth 2 (two) times daily. 1 po bid (Patient not taking: Reported on 04/07/2018) 42 tablet 0  . finasteride (PROSCAR) 5 MG tablet TAKE 1 TABLET ONCE DAILY. 30 tablet 0  . furosemide (LASIX) 20 MG tablet TAKE 1 TABLET BY MOUTH ONCE DAILY. 30 tablet 0  . HYDROcodone-acetaminophen (NORCO/VICODIN) 5-325 MG tablet Take 1-2 tablets by mouth every 4 (four) hours as needed for severe pain ((score 7 to 10)). 60 tablet 0  . hydroxypropyl methylcellulose / hypromellose (ISOPTO TEARS / GONIOVISC) 2.5 % ophthalmic solution Place 2 drops into both eyes as needed for dry eyes (IRRITATION).    Marland Kitchen methimazole (TAPAZOLE) 5 MG tablet Take 5 mg by mouth daily.     . methocarbamol (ROBAXIN) 500 MG tablet Take 1 tablet (500 mg total) by mouth every 6 (six) hours as needed for muscle spasms. 40 tablet 3  . Vitamin D, Ergocalciferol, (DRISDOL) 1.25 MG (50000 UT) CAPS capsule TAKE 1 CAPSULE BY MOUTH ONCE A WEEK. 4 capsule 0   No current facility-administered medications for this visit.     Family History  Problem Relation Age of Onset  . Liver disease Mother   . Colon cancer Father   . Heart disease Father     Social History   Socioeconomic History  . Marital status: Married    Spouse name: Butch Penny  . Number of children: Not on file  . Years of education: Not on file  . Highest education level: Not on file  Occupational History  . Not on file  Social Needs  . Financial resource strain: Not on file  . Food insecurity:    Worry: Not on file    Inability: Not on file  . Transportation needs:    Medical: Not on file    Non-medical: Not on file    Tobacco Use  . Smoking status: Current Every Day Smoker    Years: 73.00  . Smokeless tobacco: Current User    Types: Chew  . Tobacco comment: chew 1 pack per day since he was 78 years old  Substance and Sexual Activity  . Alcohol use: No  . Drug use: Never  . Sexual activity: Not on file  Lifestyle  . Physical activity:    Days per week: Not on file    Minutes per session: Not on file  . Stress: Not on  file  Relationships  . Social connections:    Talks on phone: Not on file    Gets together: Not on file    Attends religious service: Not on file    Active member of club or organization: Not on file    Attends meetings of clubs or organizations: Not on file    Relationship status: Not on file  . Intimate partner violence:    Fear of current or ex partner: Not on file    Emotionally abused: Not on file    Physically abused: Not on file    Forced sexual activity: Not on file  Other Topics Concern  . Not on file  Social History Narrative   Lives with wife   Caffeine use: 1 cup coffee per day, 1 soda per day   Right-handed     REVIEW OF SYSTEMS:   [X]  denotes positive finding, [ ]  denotes negative finding Cardiac  Comments:  Chest pain or chest pressure:    Shortness of breath upon exertion: x   Short of breath when lying flat:    Irregular heart rhythm:        Vascular    Pain in calf, thigh, or hip brought on by ambulation:    Pain in feet at night that wakes you up from your sleep:     Blood clot in your veins:    Leg swelling:  x       Pulmonary    Oxygen at home:    Productive cough:     Wheezing:         Neurologic    Sudden weakness in arms or legs:     Sudden numbness in arms or legs:     Sudden onset of difficulty speaking or slurred speech:    Temporary loss of vision in one eye:     Problems with dizziness:         Gastrointestinal    Blood in stool:     Vomited blood:         Genitourinary    Burning when urinating:     Blood in urine:         Psychiatric    Major depression:         Hematologic    Bleeding problems:    Problems with blood clotting too easily:        Skin    Rashes or ulcers:        Constitutional    Fever or chills:      PHYSICAL EXAMINATION:  Today's Vitals   08/23/18 0940 08/23/18 0941  BP: 127/79 (!) 145/85  Pulse: 64   Resp: 18   SpO2: 100%   Weight: 167 lb (75.8 kg)   Height: 5\' 6"  (1.676 m)    Body mass index is 26.95 kg/m.   General:  WDWN in NAD; vital signs documented above Gait: Not observed HENT: WNL, normocephalic Pulmonary: normal non-labored breathing , without Rales, rhonchi,  wheezing Cardiac: regular HR, without  Murmurs, rubs or gallops; without carotid bruits Abdomen: soft, NT, no palpable pulsatile masses Skin: without rashes; right CEA incision has healed nicely.   Vascular Exam/Pulses:  Right Left  Radial 2+ (normal) 2+ (normal)  Ulnar 1+ (weak) 1+ (weak)  Popliteal Unable to palpate  Unable to palpate   DP 1+ (weak) 1+ (weak)  PT 2+ (normal) 2+ (normal)   Extremities: without ischemic changes, without Gangrene , without cellulitis; without open wounds;  Musculoskeletal: no muscle  wasting or atrophy  Neurologic: A&O X 3 Psychiatric:  The pt has Normal affect.   Non-Invasive Vascular Imaging:   Carotid Duplex on 08/23/2018: Right:  1-39% stenosis Left:  1-39% stenosis Vertebrals:  Bilateral vertebral arteries demonstrate antegrade flow. Subclavians: Normal flow hemodynamics were seen in bilateral subclavian arteries.  Previous Carotid duplex on 02/19/18: Right: 1-39% stenosis Left:   1-39% stenosis Vertebrals:  Bilateral vertebral arteries demonstrate antegrade flow. Subclavians: Normal flow hemodynamics were seen in bilateral subclavian arteries.   ASSESSMENT/PLAN:: 78 y.o. male here for follow up carotid artery stenosis.  He is s/p right CEA for symptomatic carotid artery stenosis.   -pt's nerve injury basically resolved.  He states he still  has some issues with his words, but this has not worsened since seeing Dr. Donzetta Matters in August.  He has never smoked and continues with smokeless tobacco.  I discussed importance of quitting but he does not have any interest in quitting.  -His father has hx of CABG and AAA.  He does not know if the AAA was treated.  I do not feel a pulsatile mass on his abdominal exam.  When he returns in a year, we will get a medicare AAA screen at the time he has his carotid duplex.  Discussed s/s of ruptured AAA and to get to [facility] ER immediately should he develop these sx.  -discussed s/s of stroke with pt and they understand should they develop any of these sx, they will go to the nearest ER.  Leontine Locket, PA-C [facility] [phone]  Clinic MD:  Trula Slade  VASCULAR QUALITY INITIATIVE FOLLOW UP DATA:  Current smoker: [  ] yes  [ x ] no  Living status: [ x ]  Home  [  ] Nursing home  [  ] Homeless    MEDS:  ASA [ x ] yes  [  ] no- [  ] medical reason  [  ] non compliant  STATIN  [  ] yes  [ x ] no- [  ] medical reason  [  ] non compliant  Beta blocker [  ] yes  [ x ] no- [  ] medical reason  [  ] non compliant  ACE inhibitor [  ] yes  [x  ] no- [  ] medical reason  [  ] non compliant  P2Y12 Antagonist [ x ] none  [  ] clopidogrel-Plavix  [  ] ticlopidine-Ticlid   [  ] prasugrel-Effient  [  ] ticagrelor- Brilinta    Anticoagulant [x  ] None  [  ] warfarin  [  ] rivaroxaban-Xarelto [  ] dabigatran- Pradaxa  Neurologic event since D/C:  [  ] no  [  ] yes: [  ] eye event  [  ] cortical event  [  ] VB event  [  ] non specific event  [  ] right  [  ] left  [  ] TIA  [  ] stroke  Date:   Modified Rankin Score: 0  MI since D/C: [ x ] no  [  ] troponin only  [  ] EKG or clinical  Cranial nerve injury: [  ] none  [ x ] resolved  [  ] persistent  Duplex CEA site: [  ] no  [x  ] yes - PSV= 80  EDV= 27  ICA/CCA ratio:   Stenosis= [x  ] <40% [  ] 40-59% [  ] 60-79%  [  ] >  80%  [  ]   Occluded  CEA site re-operation:  [  ] no   [  ] yes- date of re-op:  CEA site PCI:   [  ] no   [  ] yes- date of PCI:

## 2018-08-23 ENCOUNTER — Ambulatory Visit (HOSPITAL_COMMUNITY)
Admission: RE | Admit: 2018-08-23 | Discharge: 2018-08-23 | Disposition: A | Discharge status: Home / Self Care | Payer: Medicare Other | Source: Ambulatory Visit | Attending: Family | Admitting: Family

## 2018-08-23 ENCOUNTER — Ambulatory Visit (INDEPENDENT_AMBULATORY_CARE_PROVIDER_SITE_OTHER): Payer: Medicare Other | Admitting: Physician Assistant

## 2018-08-23 ENCOUNTER — Other Ambulatory Visit: Payer: Self-pay

## 2018-08-23 ENCOUNTER — Encounter: Payer: Self-pay | Admitting: Family

## 2018-08-23 VITALS — BP 145/85 | HR 64 | Resp 18 | Ht 66.0 in | Wt 167.0 lb

## 2018-08-23 DIAGNOSIS — I6521 Occlusion and stenosis of right carotid artery: Secondary | ICD-10-CM

## 2018-08-23 DIAGNOSIS — I6529 Occlusion and stenosis of unspecified carotid artery: Secondary | ICD-10-CM | POA: Diagnosis not present

## 2018-08-23 DIAGNOSIS — I639 Cerebral infarction, unspecified: Secondary | ICD-10-CM | POA: Diagnosis not present

## 2018-08-24 DIAGNOSIS — E0500 Thyrotoxicosis with diffuse goiter without thyrotoxic crisis or storm: Secondary | ICD-10-CM | POA: Diagnosis not present

## 2018-08-24 DIAGNOSIS — Z79899 Other long term (current) drug therapy: Secondary | ICD-10-CM | POA: Diagnosis not present

## 2018-08-25 ENCOUNTER — Encounter: Payer: Self-pay | Admitting: Family Medicine

## 2018-08-25 ENCOUNTER — Ambulatory Visit (INDEPENDENT_AMBULATORY_CARE_PROVIDER_SITE_OTHER): Payer: Medicare Other | Admitting: Family Medicine

## 2018-08-25 VITALS — BP 108/66 | HR 66 | Temp 96.9°F | Ht 66.0 in | Wt 173.0 lb

## 2018-08-25 DIAGNOSIS — K4090 Unilateral inguinal hernia, without obstruction or gangrene, not specified as recurrent: Secondary | ICD-10-CM

## 2018-08-25 NOTE — Progress Notes (Signed)
Subjective:    Patient ID: Jacob Conner, male    DOB: 1941/07/03, 78 y.o.   MRN: [id number]  HPI  Patient here today for possible hernia on the left side of groin. He is currently not experiencing any pain.  Appears to be doing well.  Since his last visit with [place] he has had lumbar surgery by Dr. Ellene Route and is doing better with his back.  He also had a vascular visit for carotid stenosis and everything is stable with that with 1 to 39% stenosis bilaterally.  He also had a visit to his endocrinologist and he is monitoring his TSH and is to continue on his methimazole 5 mg daily and has a planned appointment with him again in about 3 months.  He is also being followed by the ophthalmologist at [facility].  His vital signs today are stable and he is concerned about a possible hernia in the left groin area.  This patient also has a history of melanoma.  In addition to Graves' disease.  The patient's recent visits specialist were reviewed.  The patient denies any chest pain pressure tightness or shortness of breath.  He does have some issues with strangling easily and swallowing.  Mostly with liquids.  He denies any other changes with his bowel habits including nausea vomiting diarrhea blood in the stool or black tarry bowel movements.  He says he is passing his water without problems.  He just noticed this fullness in the left groin very recently it just popped up according to the patient.  No pain.  This happened about 5 weeks ago.  He has had a colonoscopy and polyps were found on the colonoscopy because of the family history of colorectal cancer and this was done in May 2019.   Patient Active Problem List   Diagnosis Date Noted  . Lumbar spine scoliosis 04/20/2018  . Chronic midline low back pain without sciatica 01/27/2018  . Carotid artery stenosis 07/06/2017  . 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 Graves disease 10/12/2015  . Ophthalmic  Graves disease 10/12/2015  . Hyperthyroidism 06/08/2013  . BPH (benign prostatic hyperplasia) 04/19/2013  . Hyperlipidemia 04/19/2013  . 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 Spermatocele 04/19/2013  . Hx of colonic polyp 04/19/2013  . History of migraine headaches 04/19/2013   Outpatient Encounter Medications as of 08/25/2018  Medication Sig  . AMBULATORY NON FORMULARY MEDICATION Take 180 mg by mouth daily. Medication Name: bempedoic acid 180 mgs vs a placebo, study drug provided  . aspirin EC 81 MG tablet Take 81 mg by mouth daily.  Marland Kitchen dexamethasone (DECADRON) 1 MG tablet 2 tablets twice daily for 2 days, one tablet twice daily for 2 days, one tablet daily for 2 days.  . finasteride (PROSCAR) 5 MG tablet TAKE 1 TABLET ONCE DAILY.  . furosemide (LASIX) 20 MG tablet TAKE 1 TABLET BY MOUTH ONCE DAILY.  . hydroxypropyl methylcellulose / hypromellose (ISOPTO TEARS / GONIOVISC) 2.5 % ophthalmic solution Place 2 drops into both eyes as needed for dry eyes (IRRITATION).  Marland Kitchen methimazole (TAPAZOLE) 5 MG tablet Take 5 mg by mouth daily.   . Vitamin D, Ergocalciferol, (DRISDOL) 1.25 MG (50000 UT) CAPS capsule TAKE 1 CAPSULE BY MOUTH ONCE A WEEK.  . [DISCONTINUED] doxycycline (VIBRA-TABS) 100 MG tablet Take 1 tablet (100 mg total) by mouth 2 (two) times daily. 1 po bid (Patient not taking: Reported on 04/07/2018)  . [  DISCONTINUED] HYDROcodone-acetaminophen (NORCO/VICODIN) 5-325 MG tablet Take 1-2 tablets by mouth every 4 (four) hours as needed for severe pain ((score 7 to 10)).  . [DISCONTINUED] methocarbamol (ROBAXIN) 500 MG tablet Take 1 tablet (500 mg total) by mouth every 6 (six) hours as needed for muscle spasms.   No facility-administered encounter medications on file as of 08/25/2018.      Review of Systems  Constitutional: Negative.   HENT: Negative.   Eyes: Negative.   Respiratory: Negative.   Cardiovascular: Negative.   Gastrointestinal: Negative.     Endocrine: Negative.   Genitourinary: Negative.   Musculoskeletal: Negative.   Skin: Negative.   Allergic/Immunologic: Negative.   Neurological: Negative.   Hematological: Negative.   Psychiatric/Behavioral: Negative.        Objective:   Physical Exam Vitals signs and nursing note reviewed.  Constitutional:      Appearance: Normal appearance. He is well-developed. He is not ill-appearing.  HENT:     Head: Normocephalic and atraumatic.     Right Ear: External ear normal.     Left Ear: External ear normal.     Nose: Nose normal.  Eyes:     General: No scleral icterus.       Right eye: No discharge.        Left eye: No discharge.     Conjunctiva/sclera: Conjunctivae normal.     Pupils: Pupils are equal, round, and reactive to light.  Neck:     Musculoskeletal: Normal range of motion.     Thyroid: No thyromegaly.     Trachea: No tracheal deviation.  Genitourinary:    Penis: Normal.      Scrotum/Testes: Normal.     Comments: The external genitalia exam indicated a left inguinal hernia with normal testicles bilaterally.  The penis was normal.  Patient was examined in a standing position. Musculoskeletal: Normal range of motion.  Skin:    General: Skin is warm and dry.     Findings: No rash.  Neurological:     Mental Status: He is alert and oriented to person, place, and time. Mental status is at baseline.     Cranial Nerves: No cranial nerve deficit.     Deep Tendon Reflexes: Reflexes are normal and symmetric.  Psychiatric:        Mood and Affect: Mood normal.        Behavior: Behavior normal.        Thought Content: Thought content normal.        Judgment: Judgment normal.     Comments: Mood affect and behavior were normal for this patient.    BP 108/66   Pulse 66   Temp (!) 96.9 F (36.1 C) (Oral)   Ht 5\' 6"  (1.676 m)   Wt 173 lb (78.5 kg)   BMI 27.92 kg/m         Assessment & Plan:  1. Left inguinal hernia -Arrange visit with surgeon, Dr.  Donne Hazel -If patient develops severe pain should get to the emergency room immediately  2.  Dysphagia -If patient has worsening problems with swallowing, wife should call us so we can make an appointment with gastroenterology for follow-up.  Patient Instructions  We will arrange a visit with the surgeon to have this evaluated We will call your wife with that appointment If you develop severe pain you should get to the emergency room immediately. If you continue to have trouble swallowing, have your wife give [place] a call and we will arrange a revisit with  the gastroenterologist that did your previous colonoscopy.  Arrie Senate MD

## 2018-08-25 NOTE — Patient Instructions (Signed)
We will arrange a visit with the surgeon to have this evaluated We will call your wife with that appointment If you develop severe pain you should get to the emergency room immediately. If you continue to have trouble swallowing, have your wife give [place] a call and we will arrange a revisit with the gastroenterologist that did your previous colonoscopy.

## 2018-09-08 DIAGNOSIS — H6123 Impacted cerumen, bilateral: Secondary | ICD-10-CM | POA: Diagnosis not present

## 2018-09-10 ENCOUNTER — Telehealth: Payer: Self-pay | Admitting: *Deleted

## 2018-09-10 ENCOUNTER — Other Ambulatory Visit: Payer: Self-pay | Admitting: Family Medicine

## 2018-09-10 NOTE — Telephone Encounter (Signed)
Subject contacted for visit T5-M9 in the [employer] study.  Next appointment confirmed.

## 2018-09-10 NOTE — Telephone Encounter (Signed)
Last Vit D 10/23/17  34.5

## 2018-09-14 ENCOUNTER — Telehealth: Payer: Self-pay

## 2018-09-14 DIAGNOSIS — K4090 Unilateral inguinal hernia, without obstruction or gangrene, not specified as recurrent: Secondary | ICD-10-CM

## 2018-09-14 NOTE — Telephone Encounter (Signed)
Are you putting in a referral for surgeon for hernia or waiting since it's not bothering him

## 2018-09-14 NOTE — Telephone Encounter (Signed)
This may be my fault, I thought we did a referral to Dr. Donne Hazel for evaluation of this.  If this is not been done please arrange this and let patient's wife know of this referral.

## 2018-09-14 NOTE — Telephone Encounter (Signed)
Referral placed.

## 2018-09-29 DIAGNOSIS — M48062 Spinal stenosis, lumbar region with neurogenic claudication: Secondary | ICD-10-CM | POA: Diagnosis not present

## 2018-10-04 ENCOUNTER — Other Ambulatory Visit: Payer: Self-pay | Admitting: Family Medicine

## 2018-11-04 ENCOUNTER — Other Ambulatory Visit: Payer: Self-pay | Admitting: Family Medicine

## 2018-11-04 ENCOUNTER — Telehealth: Payer: Self-pay | Admitting: *Deleted

## 2018-11-04 NOTE — Telephone Encounter (Signed)
Notified patient to cancel next weeks appointment since we are not seeing people in the clinic.  Verbal consent given to [employer] medications to his home.  Physical address verified. Instructions given not to start medication until follow up phone call received to verify medication acceptable for use.  Also instructed to save all used and unused medication and bottles until next clinic visit.  T6-M12 visit in the [employer] study conducted via phone.  No cos, aes or saes to report.

## 2018-11-23 DIAGNOSIS — E0500 Thyrotoxicosis with diffuse goiter without thyrotoxic crisis or storm: Secondary | ICD-10-CM | POA: Diagnosis not present

## 2018-12-01 ENCOUNTER — Telehealth: Payer: Self-pay | Admitting: Family Medicine

## 2018-12-02 ENCOUNTER — Other Ambulatory Visit: Payer: Self-pay

## 2018-12-02 ENCOUNTER — Ambulatory Visit (INDEPENDENT_AMBULATORY_CARE_PROVIDER_SITE_OTHER): Payer: Medicare Other | Admitting: Family Medicine

## 2018-12-02 ENCOUNTER — Encounter: Payer: Self-pay | Admitting: Family Medicine

## 2018-12-02 VITALS — BP 138/85 | HR 76 | Temp 97.3°F | Ht 66.0 in | Wt 166.0 lb

## 2018-12-02 DIAGNOSIS — D229 Melanocytic nevi, unspecified: Secondary | ICD-10-CM

## 2018-12-02 DIAGNOSIS — D492 Neoplasm of unspecified behavior of bone, soft tissue, and skin: Secondary | ICD-10-CM

## 2018-12-02 NOTE — Progress Notes (Signed)
Subjective:  Patient ID: Jacob Conner, male    DOB: [birth year], 78 y.o.   MRN: [id number]  Chief Complaint:  Nevus   HPI: Jacob Conner is a 78 y.o. male presenting on 12/02/2018 for Nevus  Pt presents today with two abnormal moles. Pt states these lesions have been present for a while but he has noticed an increase in size and a change in color. Pt states he has had melanoma and would like to see dermatology. No associated weight loss, fever, chills, night sweats, drainage, confusion, or weakness.   Relevant past medical, surgical, family, and social history reviewed and updated as indicated.  Allergies and medications reviewed and updated.   Past Medical History:  Diagnosis Date  . Arthritis    "pretty much all over; hands, shoulders, knees, back" (04/21/2018)  . BPH (benign prostatic hyperplasia)   . Carotid artery occlusion   . 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 Dyspnea    on exertion  . GERD (gastroesophageal reflux disease)   . Graves' disease dx'd 2015   "on daily RX" (04/21/2018)  . History of kidney stones   . Hyperlipidemia   . Melanoma ([place])    "cut off back and arm; total of 4" (04/21/2018)  . Prostate disorder   . Seasonal allergies    "hay fever in spring" (04/21/2018)  . Stroke [facility])    ?,  pt nor family  was not aware of having a stroke/frontal lobe infarct (04/21/2018)  . Thyroid disease    Hyperthyroidism  and Graves    Past Surgical History:  Procedure Laterality Date  . ANTERIOR LAT LUMBAR FUSION Left 04/20/2018   Procedure: Lumbar two-three Lumbar three-four Lumbar four-five Anterolateral decompression with posterior percutaneous arthrodesis, MAZOR;  Surgeon: Kristeen Miss, MD;  Location: [place];  Service: Neurosurgery;  Laterality: Left;  . APPLICATION OF ROBOTIC ASSISTANCE FOR SPINAL PROCEDURE N/A 04/20/2018   Procedure: APPLICATION OF ROBOTIC  ASSISTANCE FOR SPINAL PROCEDURE;  Surgeon: Kristeen Miss, MD;  Location: [place];  Service: Neurosurgery;  Laterality: N/A;  . BACK SURGERY    . COLONOSCOPY    . ENDARTERECTOMY Right 07/06/2017   Procedure: ENDARTERECTOMY CAROTID RIGHT;  Surgeon: Waynetta Sandy, MD;  Location: [place];  Service: Vascular;  Laterality: Right;  . ESOPHAGOGASTRODUODENOSCOPY    . HYDROCELE EXCISION    . LITHOTRIPSY  X 2  . LUMBAR PERCUTANEOUS PEDICLE SCREW 3 LEVEL N/A 04/20/2018   Procedure: LUMBAR PERCUTANEOUS PEDICLE SCREW THREE LEVEL;  Surgeon: Kristeen Miss, MD;  Location: [place];  Service: Neurosurgery;  Laterality: N/A;  . MELANOMA EXCISION  X 4   "back, right arm"  . PATCH ANGIOPLASTY Right 07/06/2017   Procedure: PATCH ANGIOPLASTY RIGHT CAROTID ARTERY;  Surgeon: Waynetta Sandy, MD;  Location: [place];  Service: Vascular;  Laterality: Right;    Social History   Socioeconomic History  . Marital status: Married    Spouse name: Butch Penny  . Number of children: Not on file  . Years of education: Not on file  . Highest education level: Not on file  Occupational History  . Not on file  Social Needs  . Financial resource strain: Not on file  . Food insecurity:    Worry: Not on file    Inability: Not on file  . Transportation needs:    Medical: Not on file    Non-medical: Not on file  Tobacco Use  .  Smoking status: Current Every Day Smoker    Years: 73.00  . Smokeless tobacco: Current User    Types: Chew  . Tobacco comment: chew 1 pack per day since he was 78 years old  Substance and Sexual Activity  . Alcohol use: No  . Drug use: Never  . Sexual activity: Not on file  Lifestyle  . Physical activity:    Days per week: Not on file    Minutes per session: Not on file  . Stress: Not on file  Relationships  . Social connections:    Talks on phone: Not on file    Gets together: Not on file    Attends religious service: Not on file    Active member of club or organization: Not on  file    Attends meetings of clubs or organizations: Not on file    Relationship status: Not on file  . Intimate partner violence:    Fear of current or ex partner: Not on file    Emotionally abused: Not on file    Physically abused: Not on file    Forced sexual activity: Not on file  Other Topics Concern  . Not on file  Social History Narrative   Lives with wife   Caffeine use: 1 cup coffee per day, 1 soda per day   Right-handed    Outpatient Encounter Medications as of 12/02/2018  Medication Sig  . AMBULATORY NON FORMULARY MEDICATION Take 180 mg by mouth daily. Medication Name: bempedoic acid 180 mgs vs a placebo, study drug provided  . aspirin EC 81 MG tablet Take 81 mg by mouth daily.  Marland Kitchen dexamethasone (DECADRON) 1 MG tablet 2 tablets twice daily for 2 days, one tablet twice daily for 2 days, one tablet daily for 2 days.  . finasteride (PROSCAR) 5 MG tablet TAKE 1 TABLET ONCE DAILY.  . furosemide (LASIX) 20 MG tablet TAKE 1 TABLET BY MOUTH ONCE DAILY.  . hydroxypropyl methylcellulose / hypromellose (ISOPTO TEARS / GONIOVISC) 2.5 % ophthalmic solution Place 2 drops into both eyes as needed for dry eyes (IRRITATION).  Marland Kitchen methimazole (TAPAZOLE) 5 MG tablet Take 5 mg by mouth daily.   . Vitamin D, Ergocalciferol, (DRISDOL) 1.25 MG (50000 UT) CAPS capsule TAKE 1 CAPSULE BY MOUTH ONCE A WEEK.   No facility-administered encounter medications on file as of 12/02/2018.     Allergies  Allergen Reactions  . Topiramate Other (See Comments)    Cognitive clouding     Review of Systems  Constitutional: Negative for activity change, appetite change, chills, diaphoresis, fatigue, fever and unexpected weight change.  Respiratory: Negative for cough and shortness of breath.   Cardiovascular: Negative for chest pain, palpitations and leg swelling.  Gastrointestinal: Negative for constipation, diarrhea, nausea and vomiting.  Skin:       Abnormal moles  Neurological: Negative for weakness.  All  other systems reviewed and are negative.       Objective:  BP 138/85   Pulse 76   Temp (!) 97.3 F (36.3 C) (Oral)   Ht 5\' 6"  (1.676 m)   Wt 166 lb (75.3 kg)   BMI 26.79 kg/m    Wt Readings from Last 3 Encounters:  12/02/18 166 lb (75.3 kg)  08/25/18 173 lb (78.5 kg)  08/23/18 167 lb (75.8 kg)    Physical Exam Vitals signs and nursing note reviewed.  Constitutional:      General: He is not in acute distress.    Appearance: Normal appearance. He is  not ill-appearing or toxic-appearing.  HENT:     Head: Normocephalic and atraumatic.     Mouth/Throat:     Mouth: Mucous membranes are moist.  Eyes:     Conjunctiva/sclera: Conjunctivae normal.     Pupils: Pupils are equal, round, and reactive to light.  Cardiovascular:     Rate and Rhythm: Normal rate and regular rhythm.     Heart sounds: No murmur. No friction rub. No gallop.   Pulmonary:     Effort: Pulmonary effort is normal. No respiratory distress.     Breath sounds: Normal breath sounds.  Musculoskeletal: Normal range of motion.  Skin:    General: Skin is warm and dry.     Capillary Refill: Capillary refill takes less than 2 seconds.       Neurological:     General: No focal deficit present.     Mental Status: He is oriented to person, place, and time.  Psychiatric:        Mood and Affect: Mood normal.        Behavior: Behavior normal.        Thought Content: Thought content normal.        Judgment: Judgment normal.       Results for orders placed or performed during the hospital encounter of 04/20/18  CBC  Result Value Ref Range   WBC 14.3 (H) 4.0 - 10.5 K/uL   RBC 3.87 (L) 4.22 - 5.81 MIL/uL   Hemoglobin 11.7 (L) 13.0 - 17.0 g/dL   HCT 35.2 (L) 39.0 - 52.0 %   MCV 91.0 78.0 - 100.0 fL   MCH 30.2 26.0 - 34.0 pg   MCHC 33.2 30.0 - 36.0 g/dL   RDW 12.1 11.5 - 15.5 %     Platelets 265 150 - 400 K/uL  Basic Metabolic Panel  Result Value Ref Range   Sodium 136 135 - 145 mmol/L   Potassium 4.3 3.5  - 5.1 mmol/L   Chloride 98 98 - 111 mmol/L   CO2 30 22 - 32 mmol/L   Glucose, Bld 118 (H) 70 - 99 mg/dL   BUN 21 8 - 23 mg/dL   Creatinine, Ser 1.05 0.61 - 1.24 mg/dL   Calcium 8.9 8.9 - 10.3 mg/dL   GFR calc non Af Amer >60 >60 mL/min   GFR calc Af Amer >60 >60 mL/min   Anion gap 8 5 - 15       Pertinent labs & imaging results that were available during my care of the patient were reviewed by me and considered in my medical decision making.  Assessment & Plan:  Nicko was seen today for nevus.  Diagnoses and all orders for this visit:  Abnormal skin growth Atypical mole Pt has two abnormal moles that he would like evaluated by Dermatology due to his history of melanoma. Will refer today, pt prefers to see Dr. Elvera Lennox at [facility]. If unable to see dermatology in a timely manner, pt aware we can biopsy here.  -     Ambulatory referral to Dermatology     Continue all other maintenance medications.  Follow up plan: Return if symptoms worsen or fail to improve.   The above assessment and management plan was discussed with the patient. The patient verbalized understanding of and has agreed to the management plan. Patient is aware to call the clinic if symptoms persist or worsen. Patient is aware when to return to the clinic for a follow-up visit. Patient educated on when  it is appropriate to go to the emergency department.   Monia Pouch, FNP-C [facility] [phone]

## 2018-12-05 ENCOUNTER — Other Ambulatory Visit: Payer: Self-pay | Admitting: Family Medicine

## 2018-12-15 DIAGNOSIS — L821 Other seborrheic keratosis: Secondary | ICD-10-CM | POA: Diagnosis not present

## 2018-12-15 DIAGNOSIS — L570 Actinic keratosis: Secondary | ICD-10-CM | POA: Diagnosis not present

## 2018-12-15 DIAGNOSIS — C4359 Malignant melanoma of other part of trunk: Secondary | ICD-10-CM | POA: Diagnosis not present

## 2018-12-15 DIAGNOSIS — D2272 Melanocytic nevi of left lower limb, including hip: Secondary | ICD-10-CM | POA: Diagnosis not present

## 2018-12-28 DIAGNOSIS — C4359 Malignant melanoma of other part of trunk: Secondary | ICD-10-CM | POA: Diagnosis not present

## 2018-12-28 DIAGNOSIS — D0359 Melanoma in situ of other part of trunk: Secondary | ICD-10-CM | POA: Diagnosis not present

## 2018-12-30 ENCOUNTER — Other Ambulatory Visit: Payer: Self-pay | Admitting: Family Medicine

## 2019-01-13 ENCOUNTER — Other Ambulatory Visit: Payer: Self-pay | Admitting: Family Medicine

## 2019-01-29 ENCOUNTER — Other Ambulatory Visit: Payer: Self-pay | Admitting: Family Medicine

## 2019-02-07 ENCOUNTER — Other Ambulatory Visit: Payer: Self-pay | Admitting: Family Medicine

## 2019-02-07 ENCOUNTER — Other Ambulatory Visit: Payer: Self-pay

## 2019-02-08 ENCOUNTER — Ambulatory Visit (INDEPENDENT_AMBULATORY_CARE_PROVIDER_SITE_OTHER): Payer: Medicare Other | Admitting: Physician Assistant

## 2019-02-08 ENCOUNTER — Encounter: Payer: Self-pay | Admitting: Physician Assistant

## 2019-02-08 VITALS — BP 113/72 | HR 60 | Temp 98.4°F | Ht 66.0 in | Wt 162.0 lb

## 2019-02-08 DIAGNOSIS — R609 Edema, unspecified: Secondary | ICD-10-CM

## 2019-02-08 DIAGNOSIS — Z0000 Encounter for general adult medical examination without abnormal findings: Secondary | ICD-10-CM | POA: Diagnosis not present

## 2019-02-08 DIAGNOSIS — E7800 Pure hypercholesterolemia, unspecified: Secondary | ICD-10-CM | POA: Diagnosis not present

## 2019-02-08 DIAGNOSIS — E0590 Thyrotoxicosis, unspecified without thyrotoxic crisis or storm: Secondary | ICD-10-CM | POA: Diagnosis not present

## 2019-02-08 DIAGNOSIS — R601 Generalized edema: Secondary | ICD-10-CM | POA: Diagnosis not present

## 2019-02-08 DIAGNOSIS — N400 Enlarged prostate without lower urinary tract symptoms: Secondary | ICD-10-CM | POA: Diagnosis not present

## 2019-02-08 DIAGNOSIS — E559 Vitamin D deficiency, unspecified: Secondary | ICD-10-CM

## 2019-02-08 DIAGNOSIS — E0500 Thyrotoxicosis with diffuse goiter without thyrotoxic crisis or storm: Secondary | ICD-10-CM | POA: Diagnosis not present

## 2019-02-08 MED ORDER — VITAMIN D (ERGOCALCIFEROL) 1.25 MG (50000 UNIT) PO CAPS: 50000.0000 [IU] | ORAL_CAPSULE | ORAL | 3 refills | Status: AC

## 2019-02-08 MED ORDER — FUROSEMIDE 20 MG PO TABS: 20.0000 mg | ORAL_TABLET | Freq: Every day | ORAL | 3 refills | Status: DC

## 2019-02-08 MED ORDER — FINASTERIDE 5 MG PO TABS: 5.0000 mg | ORAL_TABLET | Freq: Every day | ORAL | 3 refills | Status: AC

## 2019-02-09 LAB — CMP14+EGFR
ALT: 10 IU/L (ref 0–44)
AST: 20 IU/L (ref 0–40)
Albumin/Globulin Ratio: 2.4 — ABNORMAL HIGH (ref 1.2–2.2)
Albumin: 4.5 g/dL (ref 3.7–4.7)
Alkaline Phosphatase: 65 IU/L (ref 39–117)
BUN/Creatinine Ratio: 16 (ref 10–24)
BUN: 17 mg/dL (ref 8–27)
Bilirubin Total: 0.4 mg/dL (ref 0.0–1.2)
CO2: 26 mmol/L (ref 20–29)
Calcium: 9.9 mg/dL (ref 8.6–10.2)
Chloride: 99 mmol/L (ref 96–106)
Creatinine, Ser: 1.07 mg/dL (ref 0.76–1.27)
GFR calc Af Amer: 75 mL/min/{1.73_m2} (ref >59)
GFR calc non Af Amer: 65 mL/min/{1.73_m2} (ref >59)
Globulin, Total: 1.9 g/dL (ref 1.5–4.5)
Glucose: 80 mg/dL (ref 65–99)
Potassium: 4.6 mmol/L (ref 3.5–5.2)
Sodium: 142 mmol/L (ref 134–144)
Total Protein: 6.4 g/dL (ref 6.0–8.5)

## 2019-02-09 LAB — CBC WITH DIFFERENTIAL/PLATELET
Basophils Absolute: 0 10*3/uL (ref 0.0–0.2)
Basos: 0 %
EOS (ABSOLUTE): 0.1 10*3/uL (ref 0.0–0.4)
Eos: 2 %
Hematocrit: 43.3 % (ref 37.5–51.0)
Hemoglobin: 14.6 g/dL (ref 13.0–17.7)
Immature Grans (Abs): 0 10*3/uL (ref 0.0–0.1)
Immature Granulocytes: 0 %
Lymphocytes Absolute: 1.4 10*3/uL (ref 0.7–3.1)
Lymphs: 19 %
MCH: 29.9 pg (ref 26.6–33.0)
MCHC: 33.7 g/dL (ref 31.5–35.7)
MCV: 89 fL (ref 79–97)
Monocytes Absolute: 0.5 10*3/uL (ref 0.1–0.9)
Monocytes: 6 %
Neutrophils Absolute: 5.3 10*3/uL (ref 1.4–7.0)
Neutrophils: 73 %
Platelets: 349 10*3/uL (ref 150–450)
RBC: 4.88 x10E6/uL (ref 4.14–5.80)
RDW: 13 % (ref 11.6–15.4)
WBC: 7.3 10*3/uL (ref 3.4–10.8)

## 2019-02-09 LAB — LIPID PANEL
Chol/HDL Ratio: 4.4 ratio (ref 0.0–5.0)
Cholesterol, Total: 201 mg/dL — ABNORMAL HIGH (ref 100–199)
HDL: 46 mg/dL (ref >39)
LDL Calculated: 126 mg/dL — ABNORMAL HIGH (ref 0–99)
Triglycerides: 145 mg/dL (ref 0–149)
VLDL Cholesterol Cal: 29 mg/dL (ref 5–40)

## 2019-02-09 LAB — TSH: TSH: 3.29 u[IU]/mL (ref 0.450–4.500)

## 2019-02-13 NOTE — Progress Notes (Signed)
BP 113/72   Pulse 60   Temp 98.4 F (36.9 C) (Oral)   Ht 5' 6"  (1.676 m)   Wt 162 lb (73.5 kg)   BMI 26.15 kg/m    Subjective:    Patient ID: Jacob Conner, male    DOB: Oct 14, 1940, 78 y.o.   MRN: [id number]  HPI: Jacob Conner is a 78 y.o. male presenting on 02/08/2019 for Establish Care Laurance Conner patient)  This patient comes in to establish care and have labs and medications updated.  The patient does see cardiovascular for his carotid artery occlusion and history of stroke.  He also sees ophthalmology for his eye problems.  He had a history of lumbar pain with scoliosis and arthritis in many joints.  He also has BPH.  His medications are reviewed.  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  He is still undergoing treatment with his dermatologist in [place].  He does have hyperlipidemia.  He has had Graves' disease.  So he still continues with all of his specialist.  Past Medical History:  Diagnosis Date  . Arthritis    "pretty much all over; hands, shoulders, knees, back" (04/21/2018)  . BPH (benign prostatic hyperplasia)   . Carotid artery occlusion   . 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 Dyspnea    on exertion  . GERD (gastroesophageal reflux disease)   . Graves' disease dx'd 2015   "on daily RX" (04/21/2018)  . History of kidney stones   . Hyperlipidemia   . Melanoma ([place])    "cut off back and arm; total of 4" (04/21/2018)  . Prostate disorder   . Seasonal allergies    "hay fever in spring" (04/21/2018)  . Stroke [facility])    ?,  pt nor family  was not aware of having a stroke/frontal lobe infarct (04/21/2018)  . Thyroid disease    Hyperthyroidism  and Graves   Relevant past medical, surgical, family and social history reviewed and updated as indicated. Interim medical history since our last visit reviewed. Allergies and medications reviewed and updated. DATA  REVIEWED: CHART IN EPIC  Family History reviewed for pertinent findings.  Review of Systems  Constitutional: Negative.  Negative for appetite change and fatigue.  HENT: Negative.   Eyes: Negative.  Negative for pain and visual disturbance.  Respiratory: Negative.  Negative for cough, chest tightness, shortness of breath and wheezing.   Cardiovascular: Negative.  Negative for chest pain, palpitations and leg swelling.  Gastrointestinal: Negative.  Negative for abdominal pain, diarrhea, nausea and vomiting.  Endocrine: Negative.   Genitourinary: Negative.   Musculoskeletal: Negative.   Skin: Negative.  Negative for color change and rash.  Neurological: Negative.  Negative for weakness, numbness and headaches.  Psychiatric/Behavioral: Negative.     Allergies as of 02/08/2019      Reactions   Topiramate Other (See Comments)   Cognitive clouding      Medication List       Accurate as of February 08, 2019 11:59 PM. If you have any questions, ask your nurse or doctor.        AMBULATORY NON FORMULARY MEDICATION Take 180 mg by mouth daily. Medication Name: bempedoic acid 180 mgs vs a placebo, study drug provided   aspirin EC 81 MG tablet Take 81 mg by mouth daily.   dexamethasone 1 MG tablet Commonly known as: DECADRON 2 tablets twice daily for 2 days, one  tablet twice daily for 2 days, one tablet daily for 2 days.   finasteride 5 MG tablet Commonly known as: PROSCAR Take 1 tablet (5 mg total) by mouth daily.   furosemide 20 MG tablet Commonly known as: LASIX Take 1 tablet (20 mg total) by mouth daily.   hydroxypropyl methylcellulose / hypromellose 2.5 % ophthalmic solution Commonly known as: ISOPTO TEARS / GONIOVISC Place 2 drops into both eyes as needed for dry eyes (IRRITATION).   methimazole 5 MG tablet Commonly known as: TAPAZOLE Take 5 mg by mouth daily.   Vitamin D (Ergocalciferol) 1.25 MG (50000 UT) Caps capsule Commonly known as: DRISDOL Take 1 capsule (50,000  Units total) by mouth once a week.          Objective:    BP 113/72   Pulse 60   Temp 98.4 F (36.9 C) (Oral)   Ht 5' 6"  (1.676 m)   Wt 162 lb (73.5 kg)   BMI 26.15 kg/m   Allergies  Allergen Reactions  . Topiramate Other (See Comments)    Cognitive clouding     Wt Readings from Last 3 Encounters:  02/08/19 162 lb (73.5 kg)  12/02/18 166 lb (75.3 kg)  08/25/18 173 lb (78.5 kg)    Physical Exam Vitals signs and nursing note reviewed.  Constitutional:      General: He is not in acute distress.    Appearance: He is well-developed.  HENT:     Head: Normocephalic and atraumatic.  Eyes:     Conjunctiva/sclera: Conjunctivae normal.     Pupils: Pupils are equal, round, and reactive to light.  Cardiovascular:     Rate and Rhythm: Normal rate and regular rhythm.     Heart sounds: Normal heart sounds.  Pulmonary:     Effort: Pulmonary effort is normal. No respiratory distress.     Breath sounds: Normal breath sounds.  Skin:    General: Skin is warm and dry.  Psychiatric:        Behavior: Behavior normal.     Results for orders placed or performed in visit on 02/08/19  CMP14+EGFR  Result Value Ref Range   Glucose 80 65 - 99 mg/dL   BUN 17 8 - 27 mg/dL   Creatinine, Ser 1.07 0.76 - 1.27 mg/dL   GFR calc non Af Amer 65 >59 mL/min/1.73   GFR calc Af Amer 75 >59 mL/min/1.73   BUN/Creatinine Ratio 16 10 - 24   Sodium 142 134 - 144 mmol/L   Potassium 4.6 3.5 - 5.2 mmol/L   Chloride 99 96 - 106 mmol/L   CO2 26 20 - 29 mmol/L   Calcium 9.9 8.6 - 10.2 mg/dL   Total Protein 6.4 6.0 - 8.5 g/dL   Albumin 4.5 3.7 - 4.7 g/dL   Globulin, Total 1.9 1.5 - 4.5 g/dL   Albumin/Globulin Ratio 2.4 (H) 1.2 - 2.2   Bilirubin Total 0.4 0.0 - 1.2 mg/dL   Alkaline Phosphatase 65 39 - 117 IU/L   AST 20 0 - 40 IU/L   ALT 10 0 - 44 IU/L  CBC with Differential/Platelet  Result Value Ref Range   WBC 7.3 3.4 - 10.8 x10E3/uL   RBC 4.88 4.14 - 5.80 x10E6/uL   Hemoglobin 14.6 13.0 - 17.7  g/dL   Hematocrit 43.3 37.5 - 51.0 %   MCV 89 79 - 97 fL   MCH 29.9 26.6 - 33.0 pg   MCHC 33.7 31.5 - 35.7 g/dL   RDW 13.0 11.6 -  15.4 %   Platelets 349 150 - 450 x10E3/uL   Neutrophils 73 Not Estab. %   Lymphs 19 Not Estab. %   Monocytes 6 Not Estab. %   Eos 2 Not Estab. %   Basos 0 Not Estab. %   Neutrophils Absolute 5.3 1.4 - 7.0 x10E3/uL   Lymphocytes Absolute 1.4 0.7 - 3.1 x10E3/uL   Monocytes Absolute 0.5 0.1 - 0.9 x10E3/uL   EOS (ABSOLUTE) 0.1 0.0 - 0.4 x10E3/uL   Basophils Absolute 0.0 0.0 - 0.2 x10E3/uL   Immature Granulocytes 0 Not Estab. %   Immature Grans (Abs) 0.0 0.0 - 0.1 x10E3/uL  TSH  Result Value Ref Range   TSH 3.290 0.450 - 4.500 uIU/mL  Lipid panel  Result Value Ref Range   Cholesterol, Total 201 (H) 100 - 199 mg/dL   Triglycerides 145 0 - 149 mg/dL   HDL 46 >39 mg/dL   VLDL Cholesterol Cal 29 5 - 40 mg/dL   LDL Calculated 126 (H) 0 - 99 mg/dL   Chol/HDL Ratio 4.4 0.0 - 5.0 ratio      Assessment & Plan:   1. Well adult exam - CMP14+EGFR - CBC with Differential/Platelet - TSH - Lipid panel  2. Benign prostatic hyperplasia, unspecified whether lower urinary tract symptoms present - finasteride (PROSCAR) 5 MG tablet; Take 1 tablet (5 mg total) by mouth daily.  Dispense: 90 tablet; Refill: 3  3. Edema, unspecified type - furosemide (LASIX) 20 MG tablet; Take 1 tablet (20 mg total) by mouth daily.  Dispense: 90 tablet; Refill: 3  4. Vitamin D deficiency - Vitamin D, Ergocalciferol, (DRISDOL) 1.25 MG (50000 UT) CAPS capsule; Take 1 capsule (50,000 Units total) by mouth once a week.  Dispense: 12 capsule; Refill: 3   Continue all other maintenance medications as listed above.  Follow up plan: No follow-ups on file.  Educational handout given for [place] PA-C [facility] [address]  [place], [place] [address] [phone]   02/13/2019, 9:35 PM

## 2019-02-22 ENCOUNTER — Telehealth: Payer: Self-pay | Admitting: *Deleted

## 2019-02-22 DIAGNOSIS — Z006 Encounter for examination for normal comparison and control in clinical research program: Secondary | ICD-10-CM

## 2019-02-22 NOTE — Telephone Encounter (Signed)
Spoke with patient for visit T7-M15 in the Clear research study.  No cos, aes or saes to report.  Patient will be coming to the clinic on 03/07/19 @0900  for lab draws and vital signs.

## 2019-03-02 ENCOUNTER — Encounter: Payer: Medicare Other | Admitting: *Deleted

## 2019-03-02 ENCOUNTER — Other Ambulatory Visit: Payer: Self-pay

## 2019-03-02 VITALS — BP 127/63 | HR 65 | Temp 97.8°F | Wt 161.6 lb

## 2019-03-02 DIAGNOSIS — Z006 Encounter for examination for normal comparison and control in clinical research program: Secondary | ICD-10-CM

## 2019-03-02 NOTE — Research (Signed)
Patient to research clinic for unscheduled visit in the clear research study.  Re-consented.  Subject Name: Jacob Conner  Subject met inclusion and exclusion criteria.  The informed consent form, study requirements and expectations were reviewed with the subject and questions and concerns were addressed prior to the signing of the consent form.  The subject verbalized understanding of the trial requirements.  The subject agreed to participate in the C trial and signed the informed consent  on08/12/20.  The informed consent was obtained prior to performance of any protocol-specific procedures for the subject.  A copy of the signed informed consent was given to the subject and a copy was placed in the subject's medical record.   Star Age [place]

## 2019-03-14 ENCOUNTER — Other Ambulatory Visit: Payer: Self-pay | Admitting: Family Medicine

## 2019-03-14 DIAGNOSIS — N400 Enlarged prostate without lower urinary tract symptoms: Secondary | ICD-10-CM

## 2019-03-21 DIAGNOSIS — L820 Inflamed seborrheic keratosis: Secondary | ICD-10-CM | POA: Diagnosis not present

## 2019-03-21 DIAGNOSIS — D485 Neoplasm of uncertain behavior of skin: Secondary | ICD-10-CM | POA: Diagnosis not present

## 2019-05-06 ENCOUNTER — Telehealth: Payer: Self-pay | Admitting: Physician Assistant

## 2019-05-09 ENCOUNTER — Other Ambulatory Visit: Payer: Self-pay

## 2019-05-09 ENCOUNTER — Ambulatory Visit (INDEPENDENT_AMBULATORY_CARE_PROVIDER_SITE_OTHER): Payer: Medicare Other

## 2019-05-09 DIAGNOSIS — Z23 Encounter for immunization: Secondary | ICD-10-CM

## 2019-05-12 ENCOUNTER — Other Ambulatory Visit: Payer: Self-pay

## 2019-05-12 ENCOUNTER — Encounter: Payer: Medicare Other | Admitting: *Deleted

## 2019-05-12 VITALS — BP 128/66 | HR 65 | Wt 160.0 lb

## 2019-05-12 DIAGNOSIS — Z006 Encounter for examination for normal comparison and control in clinical research program: Secondary | ICD-10-CM

## 2019-05-12 NOTE — Research (Signed)
Patient to research clinic for visit T-M18 in the clear research study.  No cos, aes or saes to report.  New drug dispensed, next phone call and clinic visit scheduled.  Previously re-consented.

## 2019-05-27 ENCOUNTER — Telehealth: Payer: Self-pay | Admitting: Physician Assistant

## 2019-05-27 ENCOUNTER — Other Ambulatory Visit: Payer: Self-pay | Admitting: Physician Assistant

## 2019-05-27 DIAGNOSIS — R1310 Dysphagia, unspecified: Secondary | ICD-10-CM

## 2019-05-27 DIAGNOSIS — R1319 Other dysphagia: Secondary | ICD-10-CM

## 2019-05-27 NOTE — Telephone Encounter (Signed)
Patient was dx with stage 3 melanoma back in June. Patient is having episodes of getting choked and would like him to go back and have endoscopy. Patient would like referral to Dr. Hilarie Fredrickson for choking.

## 2019-05-27 NOTE — Telephone Encounter (Signed)
Order prepared

## 2019-05-27 NOTE — Telephone Encounter (Signed)
Patients wife aware

## 2019-06-06 ENCOUNTER — Ambulatory Visit (INDEPENDENT_AMBULATORY_CARE_PROVIDER_SITE_OTHER): Payer: Medicare Other | Admitting: Otolaryngology

## 2019-06-06 ENCOUNTER — Other Ambulatory Visit: Payer: Self-pay

## 2019-06-15 ENCOUNTER — Encounter: Payer: Self-pay | Admitting: *Deleted

## 2019-06-23 ENCOUNTER — Other Ambulatory Visit: Payer: Self-pay

## 2019-06-23 ENCOUNTER — Telehealth: Payer: Self-pay | Admitting: Internal Medicine

## 2019-06-23 ENCOUNTER — Ambulatory Visit: Payer: Medicare Other | Admitting: Internal Medicine

## 2019-06-23 ENCOUNTER — Encounter: Payer: Self-pay | Admitting: Internal Medicine

## 2019-06-23 VITALS — BP 110/70 | HR 80 | Temp 97.8°F | Ht 62.5 in | Wt 159.4 lb

## 2019-06-23 DIAGNOSIS — R1310 Dysphagia, unspecified: Secondary | ICD-10-CM

## 2019-06-23 DIAGNOSIS — R1319 Other dysphagia: Secondary | ICD-10-CM

## 2019-06-23 NOTE — Progress Notes (Signed)
HPI: Jacob Conner is an 78 year old male with a history of adenomatous colon polyps, internal hemorrhoids, family history of colon cancer in his father, history of prostate cancer, history of melanoma, history of Graves' disease and discussed about hypothyroidism who is seen to discuss dysphagia.  He has known to me from his screening colonoscopy which I performed in May 2019.  This colonoscopy revealed 2 less than 1 cm tubular adenomas which were removed.  The exam was otherwise unremarkable except for internal hemorrhoids.  He reports in the last month or so having developed issues with solid food dysphagia.  This does not occur with liquids.  He reports it happens with foods such as chicken, peanuts and cornbread.  On several occasions he has had to stop eating and on one occasion he was worried he may not be able to get it cleared.  The food eventually works its way down and he has not had to regurgitate or vomit.  The last time this occurred was a couple weeks ago and he held his hands up and eventually the food passed.  He is not having odynophagia, nausea or vomiting.  No abdominal pain.  Regular bowel movements without diarrhea, constipation.  No blood in stool or melena.  No heartburn or indigestion  He reports that he thinks that his esophagus has been irritated because he chews tobacco.  Occasionally he will fall asleep and he feels that the tobacco juice trickled down into his esophagus and cause this problem.  He has slacked off of the tobacco and over the last week has not had a problem with swallowing.  He also notes that since his carotid endarterectomy 2 years ago he feels like he has a harder time with breathing.  He does not feel shortness of breath with rest.  No chest pain.  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    Past Medical History:  Diagnosis Date  . Arthritis    "pretty much all over; hands, shoulders, knees,  back" (04/21/2018)  . BPH (benign prostatic hyperplasia)   . Carotid artery occlusion   . 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 Dyspnea    on exertion  . GERD (gastroesophageal reflux disease)   . Graves' disease dx'd 2015   "on daily RX" (04/21/2018)  . History of kidney stones   . Hyperlipidemia   . Internal hemorrhoids   . Melanoma ([place])    "cut off back and arm; total of 4" (04/21/2018)  . Prostate disorder   . Seasonal allergies    "hay fever in spring" (04/21/2018)  . Stroke [facility])    ?,  pt nor family  was not aware of having a stroke/frontal lobe infarct (04/21/2018)  . Thyroid disease    Hyperthyroidism  and Graves  . Tubular adenoma of colon     Past Surgical History:  Procedure Laterality Date  . ANTERIOR LAT LUMBAR FUSION Left 04/20/2018   Procedure: Lumbar two-three Lumbar three-four Lumbar four-five Anterolateral decompression with posterior percutaneous arthrodesis, MAZOR;  Surgeon: Kristeen Miss, MD;  Location: [facility];  Service: Neurosurgery;  Laterality: Left;  . APPLICATION OF ROBOTIC ASSISTANCE FOR SPINAL PROCEDURE N/A 04/20/2018   Procedure: APPLICATION OF ROBOTIC ASSISTANCE FOR SPINAL PROCEDURE;  Surgeon: Kristeen Miss, MD;  Location: [facility];  Service: Neurosurgery;  Laterality: N/A;  . BACK SURGERY    . COLONOSCOPY    .  ENDARTERECTOMY Right 07/06/2017   Procedure: ENDARTERECTOMY CAROTID RIGHT;  Surgeon: Waynetta Sandy, MD;  Location: [facility];  Service: Vascular;  Laterality: Right;  . ESOPHAGOGASTRODUODENOSCOPY    . HYDROCELE EXCISION    . LITHOTRIPSY  X 2  . LUMBAR PERCUTANEOUS PEDICLE SCREW 3 LEVEL N/A 04/20/2018   Procedure: LUMBAR PERCUTANEOUS PEDICLE SCREW THREE LEVEL;  Surgeon: Kristeen Miss, MD;  Location: [facility];  Service: Neurosurgery;  Laterality: N/A;  . MELANOMA EXCISION  X 4   "back, right arm"  . PATCH ANGIOPLASTY Right 07/06/2017   Procedure:  PATCH ANGIOPLASTY RIGHT CAROTID ARTERY;  Surgeon: Waynetta Sandy, MD;  Location: [facility];  Service: Vascular;  Laterality: Right;    Outpatient Medications Prior to Visit  Medication Sig Dispense Refill  . AMBULATORY NON FORMULARY MEDICATION Take 180 mg by mouth daily. Medication Name: bempedoic acid 180 mgs vs a placebo, study drug provided    . aspirin EC 81 MG tablet Take 81 mg by mouth daily.    Marland Kitchen dexamethasone (DECADRON) 1 MG tablet 2 tablets twice daily for 2 days, one tablet twice daily for 2 days, one tablet daily for 2 days. 15 tablet 0  . finasteride (PROSCAR) 5 MG tablet Take 1 tablet (5 mg total) by mouth daily. 90 tablet 3  . furosemide (LASIX) 20 MG tablet Take 1 tablet (20 mg total) by mouth daily. 90 tablet 3  . hydroxypropyl methylcellulose / hypromellose (ISOPTO TEARS / GONIOVISC) 2.5 % ophthalmic solution Place 2 drops into both eyes as needed for dry eyes (IRRITATION).    Marland Kitchen methimazole (TAPAZOLE) 5 MG tablet Take 5 mg by mouth daily.     . Vitamin D, Ergocalciferol, (DRISDOL) 1.25 MG (50000 UT) CAPS capsule Take 1 capsule (50,000 Units total) by mouth once a week. 12 capsule 3   No facility-administered medications prior to visit.     Allergies  Allergen Reactions  . Topiramate Other (See Comments)    Cognitive clouding     Family History  Problem Relation Age of Onset  . Liver disease Mother   . Colon cancer Father   . Heart disease Father     Social History   Tobacco Use  . Smoking status: Current Every Day Smoker    Years: 73.00  . Smokeless tobacco: Current User    Types: Chew  . Tobacco comment: chew 1 pack per day since he was 78 years old  Substance Use Topics  . Alcohol use: No  . Drug use: Never    ROS: As per history of present illness, otherwise negative  Temp 97.8 F (36.6 C)   Ht 5' 2.5" (1.588 m) Comment: height measured without shoes  Wt 159 lb 6 oz (72.3 kg)   BMI 28.69 kg/m  Gen: awake, alert, NAD HEENT: anicteric CV:  RRR, no mrg Pulm: CTA b/l Abd: soft, NT/ND, +BS throughout Ext: no c/c/e Neuro: nonfocal   RELEVANT LABS AND IMAGING: CBC    Component Value Date/Time   WBC 7.3 02/08/2019 1137   WBC 14.3 (H) 04/21/2018 0515   RBC 4.88 02/08/2019 1137   RBC 3.87 (L) 04/21/2018 0515   HGB 14.6 02/08/2019 1137   HCT 43.3 02/08/2019 1137   PLT 349 02/08/2019 1137   MCV 89 02/08/2019 1137   MCH 29.9 02/08/2019 1137   MCH 30.2 04/21/2018 0515   MCHC 33.7 02/08/2019 1137   MCHC 33.2 04/21/2018 0515   RDW 13.0 02/08/2019 1137   LYMPHSABS 1.4 02/08/2019 1137   EOSABS  0.1 02/08/2019 1137   BASOSABS 0.0 02/08/2019 1137    CMP     Component Value Date/Time   NA 142 02/08/2019 1137   K 4.6 02/08/2019 1137   CL 99 02/08/2019 1137   CO2 26 02/08/2019 1137   GLUCOSE 80 02/08/2019 1137   GLUCOSE 118 (H) 04/21/2018 0515   BUN 17 02/08/2019 1137   CREATININE 1.07 02/08/2019 1137   CALCIUM 9.9 02/08/2019 1137   PROT 6.4 02/08/2019 1137   ALBUMIN 4.5 02/08/2019 1137   AST 20 02/08/2019 1137   ALT 10 02/08/2019 1137   ALKPHOS 65 02/08/2019 1137   BILITOT 0.4 02/08/2019 1137   GFRNONAA 65 02/08/2019 1137   GFRAA 75 02/08/2019 1137    ASSESSMENT/PLAN: 78 year old male with a history of adenomatous colon polyps, internal hemorrhoids, family history of colon cancer in his father, history of prostate cancer, history of melanoma, history of Graves' disease and discussed about hypothyroidism who is seen to discuss dysphagia.   1.  Esophageal dysphagia --rule out esophagitis, stricture, mass lesion.  Certainly possible he had an element of esophagitis related to his tobacco use.  He has stopped going to sleep with tobacco in his mouth and cut back on his use.  We discussed upper endoscopy but I would first recommend a barium esophagram with tablet.  If this shows pathology then upper endoscopy will be recommended.  He is happy with this plan --Barium esophagram with tablet --Upper endoscopy if abnormal  barium esophagram --Continue tobacco avoidance      [id number], [name], Pa-c [place],  [place] [address]

## 2019-06-23 NOTE — Patient Instructions (Signed)
You have been scheduled for a Barium Esophogram at [facility] (1st floor of the hospital) on Thursday 06/30/19 at 9:30 am. Please arrive 15 minutes prior to your appointment for registration. Make certain not to have anything to eat or drink 3 hours prior to your test. If you need to reschedule for any reason, please contact radiology at [phone] to do so. ________________________________________________________________ A barium swallow is an examination that concentrates on views of the esophagus. This tends to be a double contrast exam (barium and two liquids which, when combined, create a gas to distend the wall of the oesophagus) or single contrast (non-ionic iodine based). The study is usually tailored to your symptoms so a good history is essential. Attention is paid during the study to the form, structure and configuration of the esophagus, looking for functional disorders (such as aspiration, dysphagia, achalasia, motility and reflux) EXAMINATION You may be asked to change into a gown, depending on the type of swallow being performed. A radiologist and radiographer will perform the procedure. The radiologist will advise you of the type of contrast selected for your procedure and direct you during the exam. You will be asked to stand, sit or lie in several different positions and to hold a small amount of fluid in your mouth before being asked to swallow while the imaging is performed .In some instances you may be asked to swallow barium coated marshmallows to assess the motility of a solid food bolus. The exam can be recorded as a digital or video fluoroscopy procedure. POST PROCEDURE It will take 1-2 days for the barium to pass through your system. To facilitate this, it is important, unless otherwise directed, to increase your fluids for the next 24-48hrs and to resume your normal diet.  This test typically takes about 30 minutes to  perform. ________________________________________________________________  If you are age 32 or older, your body mass index should be between 23-30. Your Body mass index is 28.69 kg/m. If this is out of the aforementioned range listed, please consider follow up with your Primary Care Provider.  If you are age 66 or younger, your body mass index should be between 19-25. Your Body mass index is 28.69 kg/m. If this is out of the aformentioned range listed, please consider follow up with your Primary Care Provider.

## 2019-06-23 NOTE — Telephone Encounter (Signed)
Yes, it is ok to proceed Barium does not have iodine

## 2019-06-23 NOTE — Telephone Encounter (Signed)
Pt's wife called and stated that pt failed to mention that he has Graves's disease.  He is concerned about Barium swallow.  Please advise.

## 2019-06-23 NOTE — Telephone Encounter (Signed)
Pts wife states they forgot to mention that the pt has graves disease. She wanted to make sure he is ok for the barium swallow, he was told to never take anything that had iodine in it by his endocrinologist Dr. Nicoletta Dress at [facility] Please advise.

## 2019-06-24 NOTE — Telephone Encounter (Signed)
Pt's wife aware.

## 2019-06-30 ENCOUNTER — Ambulatory Visit (HOSPITAL_COMMUNITY)
Admission: RE | Admit: 2019-06-30 | Discharge: 2019-06-30 | Disposition: A | Discharge status: Home / Self Care | Payer: Medicare Other | Source: Ambulatory Visit | Attending: Internal Medicine | Admitting: Internal Medicine

## 2019-06-30 ENCOUNTER — Other Ambulatory Visit: Payer: Self-pay | Admitting: Internal Medicine

## 2019-06-30 ENCOUNTER — Other Ambulatory Visit: Payer: Self-pay

## 2019-06-30 DIAGNOSIS — R1310 Dysphagia, unspecified: Secondary | ICD-10-CM | POA: Diagnosis not present

## 2019-06-30 DIAGNOSIS — R1319 Other dysphagia: Secondary | ICD-10-CM

## 2019-07-01 ENCOUNTER — Other Ambulatory Visit: Payer: Self-pay

## 2019-07-01 DIAGNOSIS — R1312 Dysphagia, oropharyngeal phase: Secondary | ICD-10-CM

## 2019-07-04 ENCOUNTER — Other Ambulatory Visit: Payer: Self-pay

## 2019-07-04 ENCOUNTER — Encounter: Payer: Self-pay | Admitting: Internal Medicine

## 2019-07-04 ENCOUNTER — Ambulatory Visit (AMBULATORY_SURGERY_CENTER): Payer: Medicare Other | Admitting: *Deleted

## 2019-07-04 VITALS — Temp 96.0°F | Ht 62.5 in | Wt 160.0 lb

## 2019-07-04 DIAGNOSIS — R1319 Other dysphagia: Secondary | ICD-10-CM

## 2019-07-04 DIAGNOSIS — R1310 Dysphagia, unspecified: Secondary | ICD-10-CM

## 2019-07-04 DIAGNOSIS — Z1159 Encounter for screening for other viral diseases: Secondary | ICD-10-CM

## 2019-07-04 NOTE — Progress Notes (Signed)
Patient is here in-person for PV. Patient denies any allergies to eggs or soy. Patient denies any problems with anesthesia/sedation. Patient denies any oxygen use at home. Patient denies taking any diet/weight loss medications or blood thinners. Patient is not being treated for MRSA or C-diff. EMMI education assisgned to the patient for the procedure, this was explained and instructions given to patient. COVID-19 screening test is on 12/17, the pt is aware. Pt is aware that care partner will wait in the car during procedure; if they feel like they will be too hot or cold to wait in the car; they may wait in the 4 th floor lobby. Patient is aware to bring only one care partner. We want them to wear a mask (we do not have any that we can provide them), practice social distancing, and we will check their temperatures when they get here.  I did remind the patient that their care partner needs to stay in the parking lot the entire time and have a cell phone available, we will call them when the pt is ready for discharge. Patient will wear mask into building.

## 2019-07-06 ENCOUNTER — Other Ambulatory Visit: Payer: Self-pay | Admitting: Family Medicine

## 2019-07-06 DIAGNOSIS — R609 Edema, unspecified: Secondary | ICD-10-CM

## 2019-07-07 ENCOUNTER — Ambulatory Visit (INDEPENDENT_AMBULATORY_CARE_PROVIDER_SITE_OTHER): Payer: Medicare Other

## 2019-07-07 DIAGNOSIS — Z1159 Encounter for screening for other viral diseases: Secondary | ICD-10-CM

## 2019-07-07 LAB — SARS CORONAVIRUS 2 (TAT 6-24 HRS): SARS Coronavirus 2: NEGATIVE

## 2019-07-11 ENCOUNTER — Other Ambulatory Visit: Payer: Self-pay

## 2019-07-11 ENCOUNTER — Ambulatory Visit (AMBULATORY_SURGERY_CENTER): Payer: Medicare Other | Admitting: Internal Medicine

## 2019-07-11 ENCOUNTER — Encounter: Payer: Self-pay | Admitting: Internal Medicine

## 2019-07-11 VITALS — BP 106/70 | HR 70 | Temp 97.6°F | Resp 18 | Ht 62.5 in | Wt 160.0 lb

## 2019-07-11 DIAGNOSIS — R1310 Dysphagia, unspecified: Secondary | ICD-10-CM | POA: Diagnosis not present

## 2019-07-11 DIAGNOSIS — R1319 Other dysphagia: Secondary | ICD-10-CM

## 2019-07-11 DIAGNOSIS — K222 Esophageal obstruction: Secondary | ICD-10-CM

## 2019-07-11 MED ORDER — SODIUM CHLORIDE 0.9 % IV SOLN: 500.0000 mL | Freq: Once | INTRAVENOUS | Status: DC

## 2019-07-11 NOTE — Progress Notes (Signed)
To PACU, VSS. Report to [email or web address] 

## 2019-07-11 NOTE — Progress Notes (Signed)
Called to room to assist during endoscopic procedure.  Patient ID and intended procedure confirmed with present staff. Received instructions for my participation in the procedure from the performing physician.  

## 2019-07-11 NOTE — Progress Notes (Signed)
No problems noted in the recovery room. maw 

## 2019-07-11 NOTE — Op Note (Signed)
[place] Patient Name: Jacob Conner Procedure Date: 07/11/2019 10:26 AM MRN: [id number] Endoscopist: Jerene Bears , MD Age: 78 Referring MD:  Date of Birth: May 25, 1941 Gender: Male Account #: [id number] Procedure:                Upper GI endoscopy Indications:              Dysphagia, Abnormal cine-esophagram Medicines:                Monitored Anesthesia Care Procedure:                Pre-Anesthesia Assessment:                           - Prior to the procedure, a History and Physical                            was performed, and patient medications and                            allergies were reviewed. The patient's tolerance of                            previous anesthesia was also reviewed. The risks                            and benefits of the procedure and the sedation                            options and risks were discussed with the patient.                            All questions were answered, and informed consent                            was obtained. Prior Anticoagulants: The patient has                            taken no previous anticoagulant or antiplatelet                            agents. ASA Grade Assessment: III - A patient with                            severe systemic disease. After reviewing the risks                            and benefits, the patient was deemed in                            satisfactory condition to undergo the procedure.                           After obtaining informed consent, the endoscope was  passed under direct vision. Throughout the                            procedure, the patient's blood pressure, pulse, and                            oxygen saturations were monitored continuously. The                            Endoscope was introduced through the mouth, and                            advanced to the second part of duodenum. The upper                            GI endoscopy was  accomplished without difficulty.                            The patient tolerated the procedure well. Scope In: Scope Out: Findings:                 Two benign-appearing, intrinsic moderate                            (circumferential scarring or stenosis; an endoscope                            may pass) stenoses were found 34 to 35 cm from the                            incisors. The narrowest stenosis measured 1 cm                            (inner diameter) x less than one cm (in length). A                            TTS dilator was passed through the scope. Dilation                            with a 13.5-14.5-15.5 mm balloon dilator was                            performed to 15.5 mm. The dilation site was                            examined and showed moderate mucosal disruption.                            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                           The exam of the esophagus was otherwise normal.                           The entire examined stomach was normal.                           The examined duodenum was normal. Complications:            No immediate complications. Estimated Blood Loss:     Estimated blood loss was minimal. Impression:               - Benign-appearing esophageal stenoses. Dilated.                           - Normal stomach.                           - Normal examined duodenum.                           - No specimens collected. Recommendation:           - Patient has a contact number available for                            emergencies. The signs and symptoms of potential                            delayed complications were discussed with the                            patient. Return to normal activities tomorrow.                            Written discharge instructions were provided to  the                            patient.                           - Post-dilation diet and then advance diet as                            tolerated.                           - Continue present medications.                           - Referral to speech and swallow therapy to address                            oropharyngeal dysphagia. 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 If after swallow therapy                            evaluation oropharyngeal dysphagia continues then                            ENT referral. Please let me know if you continue to                            have swallowing difficulties. Jerene Bears, MD 07/11/2019 10:55:47 AM This report has been signed electronically.

## 2019-07-11 NOTE — Progress Notes (Signed)
Temp by JB  VS by CW  Pt's states no medical or surgical changes since previsit or office visit.

## 2019-07-11 NOTE — Patient Instructions (Signed)
YOU HAD AN ENDOSCOPIC PROCEDURE TODAY AT [facility]:   Refer to the procedure report that was given to you for any specific questions about what was found during the examination.  If the procedure report does not answer your questions, please call your gastroenterologist to clarify.  If you requested that your care partner not be given the details of your procedure findings, then the procedure report has been included in a sealed envelope for you to review at your convenience later.  YOU SHOULD EXPECT: Some feelings of bloating in the abdomen. Passage of more gas than usual.  Walking can help get rid of the air that was put into your GI tract during the procedure and reduce the bloating. If you had a lower endoscopy (such as a colonoscopy or flexible sigmoidoscopy) you may notice spotting of blood in your stool or on the toilet paper. If you underwent a bowel prep for your procedure, you may not have a normal bowel movement for a few days.  Please Note:  You might notice some irritation and congestion in your nose or some drainage.  This is from the oxygen used during your procedure.  There is no need for concern and it should clear up in a day or so.  SYMPTOMS TO REPORT IMMEDIATELY:     Following upper endoscopy (EGD)  Vomiting of blood or coffee ground material  New chest pain or pain under the shoulder blades  Painful or persistently difficult swallowing  New shortness of breath  Fever of 100F or higher  Black, tarry-looking stools  For urgent or emergent issues, a gastroenterologist can be reached at any hour by calling [phone].   DIET: Please follow the dilatation diet the rest of today.  Handout was given to you.  Drink plenty of fluids but you should avoid alcoholic beverages for 24 hours.  ACTIVITY:  You should plan to take it easy for the rest of today and you should NOT DRIVE or use heavy machinery until tomorrow (because of the sedation medicines used  during the test).    FOLLOW UP: Our staff will call the number listed on your records 48-72 hours following your procedure to check on you and address any questions or concerns that you may have regarding the information given to you following your procedure. If we do not reach you, we will leave a message.  We will attempt to reach you two times.  During this call, we will ask if you have developed any symptoms of COVID 19. If you develop any symptoms (ie: fever, flu-like symptoms, shortness of breath, cough etc.) before then, please call [phone].  If you test positive for Covid 19 in the 2 weeks post procedure, please call and report this information to [place].    If any biopsies were taken you will be contacted by phone or by letter within the next 1-3 weeks.  Please call us at [phone] if you have not heard about the biopsies in 3 weeks.    SIGNATURES/CONFIDENTIALITY: You and/or your care partner have signed paperwork which will be entered into your electronic medical record.  These signatures attest to the fact that that the information above on your After Visit Summary has been reviewed and is understood.  Full responsibility of the confidentiality of this discharge information lies with you and/or your care-partner.    Handout were given to you on the esophageal dilatation diet to follow the rest of today. You may resume your current  medications today. Referral to speech and swallow therapy to address oropharyngeal difficuility swallowing. Please let Dr. Hilarie Fredrickson know if you continue to have swallowing difficulties. Please call if any questions or concerns.

## 2019-07-13 ENCOUNTER — Telehealth: Payer: Self-pay | Admitting: *Deleted

## 2019-07-13 ENCOUNTER — Other Ambulatory Visit: Payer: Self-pay

## 2019-07-13 ENCOUNTER — Other Ambulatory Visit (HOSPITAL_COMMUNITY): Payer: Self-pay

## 2019-07-13 ENCOUNTER — Telehealth: Payer: Self-pay | Admitting: Internal Medicine

## 2019-07-13 DIAGNOSIS — R1310 Dysphagia, unspecified: Secondary | ICD-10-CM

## 2019-07-13 DIAGNOSIS — R1319 Other dysphagia: Secondary | ICD-10-CM

## 2019-07-13 NOTE — Telephone Encounter (Signed)
  Follow up Call-  Call back number 07/11/2019 12/07/2017  Post procedure Call Back phone  # [phone]  Permission to leave phone message Yes Yes  Some recent data might be hidden     Patient questions:  Do you have a fever, pain , or abdominal swelling? No. Pain Score  0 *  Have you tolerated food without any problems? Yes.    Have you been able to return to your normal activities? Yes.    Do you have any questions about your discharge instructions: Diet   No. Medications  No. Follow up visit  No.  Do you have questions or concerns about your Care? No.  Actions: * If pain score is 4 or above: No action needed, pain <4.  1. Have you developed a fever since your procedure? no  2.   Have you had an respiratory symptoms (SOB or cough) since your procedure? no  3.   Have you tested positive for COVID 19 since your procedure no  4.   Have you had any family members/close contacts diagnosed with the COVID 19 since your procedure?  no   If yes to any of these questions please route to Joylene John, RN and Alphonsa Gin, Therapist, sports.

## 2019-07-13 NOTE — Telephone Encounter (Signed)
Referral was placed for pt to have speech therapy on 12/11. Spoke with rehab and was told pt must have a modified barium swallow before he can have speech therapy. Pt scheduled for MBS at [facility] 07/27/19@1pm , pt to arrive there at 12:45pm. Pts wife aware of appt. Wife given the phone number [phone] to call and schedule the speech therapy once the MBS is completed.

## 2019-07-27 ENCOUNTER — Ambulatory Visit (HOSPITAL_COMMUNITY)
Admission: RE | Admit: 2019-07-27 | Discharge: 2019-07-27 | Disposition: A | Discharge status: Home / Self Care | Payer: Medicare Other | Source: Ambulatory Visit | Attending: Internal Medicine | Admitting: Internal Medicine

## 2019-07-27 ENCOUNTER — Other Ambulatory Visit: Payer: Self-pay

## 2019-07-27 DIAGNOSIS — R1319 Other dysphagia: Secondary | ICD-10-CM

## 2019-07-27 DIAGNOSIS — R1310 Dysphagia, unspecified: Secondary | ICD-10-CM

## 2019-08-05 ENCOUNTER — Ambulatory Visit: Payer: Medicare Other | Admitting: Speech Pathology

## 2019-08-05 ENCOUNTER — Telehealth: Payer: Self-pay | Admitting: Speech Pathology

## 2019-08-05 NOTE — Telephone Encounter (Signed)
  Phoned spouse to cancel pt's appointment for swallow evaluation at oupt rehab. Upon chart review, MBSS revealed WNL oropharyngeal swallow. Outpt speech therapy for swallowing is not indicated at this time. Spouse is aware and in agreement. Incidentally, spouse expressed concern re: pt's speech s/p CVA. Instructed spouse that pt would need a different order for speech/language therapy, as dx for this evaluation is swallowing. Instructed spouse to continue to f/u with Dr. Hilarie Fredrickson for esophageal dysphagia.

## 2019-08-23 ENCOUNTER — Telehealth: Payer: Self-pay | Admitting: *Deleted

## 2019-08-23 DIAGNOSIS — Z006 Encounter for examination for normal comparison and control in clinical research program: Secondary | ICD-10-CM

## 2019-08-23 NOTE — Telephone Encounter (Signed)
Patient contacted/chart checked for visit T9-M21 in the [employer] study. No aes or saes to report.  Next clinic visit confirmed.

## 2019-08-28 ENCOUNTER — Other Ambulatory Visit: Payer: Self-pay

## 2019-08-28 DIAGNOSIS — I6529 Occlusion and stenosis of unspecified carotid artery: Secondary | ICD-10-CM

## 2019-08-28 DIAGNOSIS — Z136 Encounter for screening for cardiovascular disorders: Secondary | ICD-10-CM

## 2019-08-28 NOTE — Progress Notes (Signed)
Office Note     CC:  follow up carotid artery stenosis Requesting Provider:  Terald Sleeper, PA-C  HPI: Jacob Conner is a 79 y.o. (11-Jun-1941) male who presents for routine follow-up status post right carotid endarterectomy in December 2018 by Dr. Donzetta Matters secondary to symptomatic carotid artery stenosis.  He states he has had some vision disturbances that are described as visual field cuts once prior to surgery and once recently.  He denies temporary monocular blindness, extremity weakness, slurred speech or ataxia.  The pt not on a statin for cholesterol management.  The pt is on a daily aspirin.   Other AC: None The pt not on occasion for hypertension.   The pt not diabetic.   Tobacco hx: Chews tobacco.  No history of smoking  Past Medical History:  Diagnosis Date  . Arthritis    "pretty much all over; hands, shoulders, knees, back" (04/21/2018)  . BPH (benign prostatic hyperplasia)   . Carotid artery occlusion   . 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 Dyspnea    on exertion  . GERD (gastroesophageal reflux disease)   . Graves' disease dx'd 2015   "on daily RX" (04/21/2018)  . History of kidney stones   . Hyperlipidemia   . Internal hemorrhoids   . Melanoma ([place])    "cut off back and arm; total of 4" (04/21/2018)  . Prostate disorder   . Seasonal allergies    "hay fever in spring" (04/21/2018)  . Stroke [facility])    ?,  pt nor family  was not aware of having a stroke/frontal lobe infarct (04/21/2018)  . Thyroid disease    Hyperthyroidism  and Graves  . Tubular adenoma of colon     Past Surgical History:  Procedure Laterality Date  . ANTERIOR LAT LUMBAR FUSION Left 04/20/2018   Procedure: Lumbar two-three Lumbar three-four Lumbar four-five Anterolateral decompression with posterior percutaneous arthrodesis, MAZOR;  Surgeon: Kristeen Miss, MD;  Location: [place];  Service: Neurosurgery;   Laterality: Left;  . APPLICATION OF ROBOTIC ASSISTANCE FOR SPINAL PROCEDURE N/A 04/20/2018   Procedure: APPLICATION OF ROBOTIC ASSISTANCE FOR SPINAL PROCEDURE;  Surgeon: Kristeen Miss, MD;  Location: [place];  Service: Neurosurgery;  Laterality: N/A;  . BACK SURGERY    . COLONOSCOPY  2019  . ENDARTERECTOMY Right 07/06/2017   Procedure: ENDARTERECTOMY CAROTID RIGHT;  Surgeon: Waynetta Sandy, MD;  Location: [place];  Service: Vascular;  Laterality: Right;  . ESOPHAGOGASTRODUODENOSCOPY    . HYDROCELE EXCISION    . LITHOTRIPSY  X 2  . LUMBAR PERCUTANEOUS PEDICLE SCREW 3 LEVEL N/A 04/20/2018   Procedure: LUMBAR PERCUTANEOUS PEDICLE SCREW THREE LEVEL;  Surgeon: Kristeen Miss, MD;  Location: [place];  Service: Neurosurgery;  Laterality: N/A;  . MELANOMA EXCISION  X 4   "back, right arm"  . PATCH ANGIOPLASTY Right 07/06/2017   Procedure: PATCH ANGIOPLASTY RIGHT CAROTID ARTERY;  Surgeon: Waynetta Sandy, MD;  Location: [place];  Service: Vascular;  Laterality: Right;    Social History   Socioeconomic History  . Marital status: Married    Spouse name: Butch Penny  . Number of children: Not on file  . Years of education: Not on file  . Highest education level: Not on file  Occupational History  . Not on file  Tobacco Use  . Smoking status: Never Smoker  . Smokeless tobacco: Current User    Types: Chew  . Tobacco comment: chew 1  pack per day since he was 79 years old  Substance and Sexual Activity  . Alcohol use: No  . Drug use: Never  . Sexual activity: Not on file  Other Topics Concern  . Not on file  Social History Narrative   Lives with wife   Caffeine use: 1 cup coffee per day, 1 soda per day   Right-handed   Social Determinants of Health   Financial Resource Strain:   . Difficulty of Paying Living Expenses: Not on file  Food Insecurity:   . Worried About Charity fundraiser in the Last Year: Not on file  . Ran Out of Food in the Last Year: Not on file  Transportation  Needs:   . Lack of Transportation (Medical): Not on file  . Lack of Transportation (Non-Medical): Not on file  Physical Activity:   . Days of Exercise per Week: Not on file  . Minutes of Exercise per Session: Not on file  Stress:   . Feeling of Stress : Not on file  Social Connections:   . Frequency of Communication with Friends and Family: Not on file  . Frequency of Social Gatherings with Friends and Family: Not on file  . Attends Religious Services: Not on file  . Active Member of Clubs or Organizations: Not on file  . Attends Archivist Meetings: Not on file  . Marital Status: Not on file  Intimate Partner Violence:   . Fear of Current or Ex-Partner: Not on file  . Emotionally Abused: Not on file  . Physically Abused: Not on file  . Sexually Abused: Not on file    Family History  Problem Relation Age of Onset  . Liver disease Mother   . Colon cancer Father   . Heart disease Father   . Esophageal cancer Neg Hx   . Rectal cancer Neg Hx   . Stomach cancer Neg Hx     Current Outpatient Medications  Medication Sig Dispense Refill  . AMBULATORY NON FORMULARY MEDICATION Take 180 mg by mouth daily. Medication Name: bempedoic acid 180 mgs vs a placebo, study drug provided    . aspirin EC 81 MG tablet Take 81 mg by mouth daily.    Marland Kitchen dexamethasone (DECADRON) 1 MG tablet 2 tablets twice daily for 2 days, one tablet twice daily for 2 days, one tablet daily for 2 days. 15 tablet 0  . finasteride (PROSCAR) 5 MG tablet Take 1 tablet (5 mg total) by mouth daily. 90 tablet 3  . furosemide (LASIX) 20 MG tablet TAKE 1 TABLET BY MOUTH ONCE DAILY. 90 tablet 0  . hydroxypropyl methylcellulose / hypromellose (ISOPTO TEARS / GONIOVISC) 2.5 % ophthalmic solution Place 2 drops into both eyes as needed for dry eyes (IRRITATION).    Marland Kitchen methimazole (TAPAZOLE) 5 MG tablet Take 5 mg by mouth daily.     . Vitamin D, Ergocalciferol, (DRISDOL) 1.25 MG (50000 UT) CAPS capsule Take 1 capsule (50,000  Units total) by mouth once a week. 12 capsule 3   No current facility-administered medications for this visit.    Allergies  Allergen Reactions  . Topamax [Topiramate] Other (See Comments)    Cognitive clouding      REVIEW OF SYSTEMS:   [X]  denotes positive finding, [ ]  denotes negative finding Cardiac  Comments:  Chest pain or chest pressure:    Shortness of breath upon exertion:    Short of breath when lying flat:    Irregular heart rhythm:  Vascular    Pain in calf, thigh, or hip brought on by ambulation:    Pain in feet at night that wakes you up from your sleep:     Blood clot in your veins:    Leg swelling:         Pulmonary    Oxygen at home:    Productive cough:     Wheezing:         Neurologic    Sudden weakness in arms or legs:     Sudden numbness in arms or legs:     Sudden onset of difficulty speaking or slurred speech:    Temporary loss of vision in one eye:     Problems with dizziness:         Gastrointestinal    Blood in stool:     Vomited blood:         Genitourinary    Burning when urinating:     Blood in urine:        Psychiatric    Major depression:         Hematologic    Bleeding problems:    Problems with blood clotting too easily:        Skin    Rashes or ulcers:        Constitutional    Fever or chills:      PHYSICAL EXAMINATION:  There were no vitals filed for this visit.  General:  WDWN in NAD; vital signs documented above Gait: Not observed HENT: WNL, normocephalic Pulmonary: normal non-labored breathing , without Rales, rhonchi,  wheezing Cardiac: regular HR, without  Murmurs without carotid bruits Abdomen: soft, NT, no masses Skin: without rashes Vascular Exam/Pulses:  Right Left  Radial 2+ (normal) 2+ (normal)  Ulnar  not evaluated  not evaluated  Femoral 2+ (normal) 2+ (normal)  Popliteal 2+ (normal) 2+ (normal)  DP absent absent  PT absent absent   Extremities: without ischemic changes, without  Gangrene , without cellulitis; without open wounds;  Musculoskeletal: no muscle wasting or atrophy  Neurologic: A&O X 3;  No focal weakness or paresthesias are detected.  Tongue is midline.  Face is symmetrical Psychiatric:  The pt has Normal affect.   Non-Invasive Vascular Imaging:   08/23/2018 Right Carotid: Velocities in the right ICA are consistent with a 1-39%  stenosis.   Left Carotid: Velocities in the left ICA are consistent with a 1-39%  stenosis.   Vertebrals: Bilateral vertebral arteries demonstrate antegrade flow.  Subclavians: Normal flow hemodynamics were seen in bilateral subclavian        arteries.   08/29/2019 Right Carotid: Velocities in the right ICA are consistent with a 1-39%  stenosis.         Non-hemodynamically significant plaque <50% noted in the  CCA.   Left Carotid: Velocities in the left ICA are consistent with a 1-39%  stenosis.        Non-hemodynamically significant plaque <50% noted in the  CCA. The        ECA appears <50% stenosed.   Vertebrals: Bilateral vertebral arteries demonstrate antegrade flow.  Subclavians: Normal flow hemodynamics were seen in bilateral subclavian        arteries.   AAA screening: Abdominal Aorta Findings:  +-----------+-------+----------+----------+---------+--------+--------+  Location  AP (cm)Trans (cm)PSV (cm/s)Waveform ThrombusComments  +-----------+-------+----------+----------+---------+--------+--------+  Proximal  1.84  1.52   57    triphasic          +-----------+-------+----------+----------+---------+--------+--------+  Mid    1.93  1.77  132    triphasic          +-----------+-------+----------+----------+---------+--------+--------+  Distal   1.64  1.86   64    triphasic          +-----------+-------+----------+----------+---------+--------+--------+  RT CIA Prox1.2  1.3     93    triphasic          +-----------+-------+----------+----------+---------+--------+--------+  LT CIA Prox1.3  1.1    121    triphasic          +-----------+-------+----------+----------+---------+--------+--------+          Summary:  Abdominal Aorta: No evidence of an abdominal aortic aneurysm was  visualized.    *See table(s) above for measurements and observations.    Preliminary     ASSESSMENT/PLAN:: 79 y.o. male here for follow up for right carotid endarterectomy secondary to symptomatic carotid artery stenosis in 2018.  The patient is asymptomatic.  We reviewed signs and symptoms of TIA/CVA.  He continues on aspirin daily.  No evidence of abdominal aortic aneurysm   -Follow-up in 1 year with carotid artery duplex   Barbie Banner, PA-C [facility] MD:   Trula Slade

## 2019-08-29 ENCOUNTER — Other Ambulatory Visit: Payer: Self-pay

## 2019-08-29 ENCOUNTER — Ambulatory Visit (HOSPITAL_COMMUNITY)
Admission: RE | Admit: 2019-08-29 | Discharge: 2019-08-29 | Disposition: A | Discharge status: Home / Self Care | Payer: Medicare Other | Source: Ambulatory Visit | Attending: Surgery | Admitting: Surgery

## 2019-08-29 ENCOUNTER — Ambulatory Visit (INDEPENDENT_AMBULATORY_CARE_PROVIDER_SITE_OTHER): Payer: Medicare Other | Admitting: Physician Assistant

## 2019-08-29 ENCOUNTER — Ambulatory Visit: Payer: Medicare Other

## 2019-08-29 ENCOUNTER — Ambulatory Visit (INDEPENDENT_AMBULATORY_CARE_PROVIDER_SITE_OTHER)
Admission: RE | Admit: 2019-08-29 | Discharge: 2019-08-29 | Disposition: A | Discharge status: Home / Self Care | Payer: Medicare Other | Source: Ambulatory Visit | Attending: Surgery | Admitting: Surgery

## 2019-08-29 VITALS — BP 124/80 | HR 71 | Temp 96.8°F | Resp 16 | Ht 64.0 in | Wt 149.0 lb

## 2019-08-29 DIAGNOSIS — I6529 Occlusion and stenosis of unspecified carotid artery: Secondary | ICD-10-CM

## 2019-08-29 DIAGNOSIS — E785 Hyperlipidemia, unspecified: Secondary | ICD-10-CM | POA: Diagnosis not present

## 2019-08-29 DIAGNOSIS — Z136 Encounter for screening for cardiovascular disorders: Secondary | ICD-10-CM | POA: Insufficient documentation

## 2019-08-29 DIAGNOSIS — Z8249 Family history of ischemic heart disease and other diseases of the circulatory system: Secondary | ICD-10-CM | POA: Diagnosis not present

## 2019-08-30 ENCOUNTER — Other Ambulatory Visit: Payer: Self-pay | Admitting: *Deleted

## 2019-08-30 DIAGNOSIS — I6529 Occlusion and stenosis of unspecified carotid artery: Secondary | ICD-10-CM

## 2019-09-02 ENCOUNTER — Telehealth: Payer: Self-pay | Admitting: Oncology

## 2019-09-02 NOTE — Telephone Encounter (Signed)
Received a new pt referral from Dr. Danella Sensing at [facility] for metastatic melanoma. [name] has been cld and scheduled to see D. Shadad on 2/19 at 2pm. Pt and spouse aware to arrive 15 minutes early.

## 2019-09-09 ENCOUNTER — Inpatient Hospital Stay: Payer: Medicare Other | Attending: Oncology | Admitting: Oncology

## 2019-09-09 ENCOUNTER — Other Ambulatory Visit: Payer: Self-pay

## 2019-09-09 VITALS — BP 142/77 | HR 75 | Temp 98.2°F | Resp 18 | Ht 64.0 in | Wt 154.2 lb

## 2019-09-09 DIAGNOSIS — C439 Malignant melanoma of skin, unspecified: Secondary | ICD-10-CM | POA: Diagnosis not present

## 2019-09-09 NOTE — Progress Notes (Signed)
Reason for the request:    Melanoma  HPI: I was asked by Dr. Ronnald Ramp to evaluate Jacob Conner for diagnosis of melanoma.  He is a 79 year old man with a history of hypertension hypothyroidism with a diagnosis of melanoma in May 2020.  He was evaluated by Dr. Ronnald Ramp for an upper back lesion and underwent shave biopsy on Dec 15, 2018.  The biopsy showed a superficial spreading melanoma measuring at least 3 mm with close margins but not involved.  No ulceration or satellitosis identified.  At that time felt he had T3b disease.  He underwent wide excision at that time without any lymph node sampling.   He subsequently developed multiple lesions on his back in the last few weeks.  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  He is reporting other skin nodules all including subcutaneous nodules on his back and in the axillary region.  Clinically he has no other complaints.  He denies any excessive fatigue tiredness or weakness.  Continues to perform activities of daily living.   He does not report any headaches, blurry vision, syncope or seizures. Does not report any fevers, chills or sweats.  Does not report any cough, wheezing or hemoptysis.  Does not report any chest pain, palpitation, orthopnea or leg edema.  Does not report any nausea, vomiting or abdominal pain.  Does not report any constipation or diarrhea.  Does not report any skeletal complaints.    Does not report frequency, urgency or hematuria.  Does not report any skin rashes or lesions. Does not report any heat or cold intolerance.  Does not report any lymphadenopathy or petechiae.  Does not report any anxiety or depression.  Remaining review of systems is negative.    Past Medical History:  Diagnosis Date  . Arthritis    "pretty much all over; hands, shoulders, knees, back" (04/21/2018)  . BPH (benign prostatic hyperplasia)   . Carotid  artery occlusion   . 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 Dyspnea    on exertion  . GERD (gastroesophageal reflux disease)   . Graves' disease dx'd 2015   "on daily RX" (04/21/2018)  . History of kidney stones   . Hyperlipidemia   . Internal hemorrhoids   . Melanoma ([place])    "cut off back and arm; total of 4" (04/21/2018)  . Prostate disorder   . Seasonal allergies    "hay fever in spring" (04/21/2018)  . Stroke [facility])    ?,  pt nor family  was not aware of having a stroke/frontal lobe infarct (04/21/2018)  . Thyroid disease    Hyperthyroidism  and Graves  . Tubular adenoma of colon   :  Past Surgical History:  Procedure Laterality Date  . ANTERIOR LAT LUMBAR FUSION Left 04/20/2018   Procedure: Lumbar two-three Lumbar three-four Lumbar four-five Anterolateral decompression with posterior percutaneous arthrodesis, MAZOR;  Surgeon: Kristeen Miss, MD;  Location: [place];  Service: Neurosurgery;  Laterality: Left;  . APPLICATION OF ROBOTIC ASSISTANCE FOR SPINAL PROCEDURE N/A 04/20/2018   Procedure: APPLICATION OF ROBOTIC ASSISTANCE FOR SPINAL PROCEDURE;  Surgeon: Kristeen Miss, MD;  Location: [place];  Service: Neurosurgery;  Laterality: N/A;  . BACK SURGERY    . COLONOSCOPY  2019  . ENDARTERECTOMY Right 07/06/2017   Procedure: ENDARTERECTOMY CAROTID RIGHT;  Surgeon: Waynetta Sandy, MD;  Location:  [facility] OR;  Service: Vascular;  Laterality: Right;  . ESOPHAGOGASTRODUODENOSCOPY    . HYDROCELE EXCISION    . LITHOTRIPSY  X 2  . LUMBAR PERCUTANEOUS PEDICLE SCREW 3 LEVEL N/A 04/20/2018   Procedure: LUMBAR PERCUTANEOUS PEDICLE SCREW THREE LEVEL;  Surgeon: Kristeen Miss, MD;  Location: [place];  Service: Neurosurgery;  Laterality: N/A;  . MELANOMA EXCISION  X 4   "back, right arm"  . PATCH ANGIOPLASTY Right 07/06/2017   Procedure: PATCH ANGIOPLASTY RIGHT CAROTID ARTERY;  Surgeon: Waynetta Sandy, MD;  Location: [place];  Service: Vascular;  Laterality: Right;  :   Current Outpatient Medications:  .  AMBULATORY NON FORMULARY MEDICATION, Take 180 mg by mouth daily. Medication Name: bempedoic acid 180 mgs vs a placebo, study drug provided, Disp: , Rfl:  .  aspirin EC 81 MG tablet, Take 81 mg by mouth daily., Disp: , Rfl:  .  dexamethasone (DECADRON) 1 MG tablet, 2 tablets twice daily for 2 days, one tablet twice daily for 2 days, one tablet daily for 2 days., Disp: 15 tablet, Rfl: 0 .  finasteride (PROSCAR) 5 MG tablet, Take 1 tablet (5 mg total) by mouth daily., Disp: 90 tablet, Rfl: 3 .  furosemide (LASIX) 20 MG tablet, TAKE 1 TABLET BY MOUTH ONCE DAILY., Disp: 90 tablet, Rfl: 0 .  hydroxypropyl methylcellulose / hypromellose (ISOPTO TEARS / GONIOVISC) 2.5 % ophthalmic solution, Place 2 drops into both eyes as needed for dry eyes (IRRITATION)., Disp: , Rfl:  .  methimazole (TAPAZOLE) 5 MG tablet, Take 5 mg by mouth daily. , Disp: , Rfl:  .  Vitamin D, Ergocalciferol, (DRISDOL) 1.25 MG (50000 UT) CAPS capsule, Take 1 capsule (50,000 Units total) by mouth once a week., Disp: 12 capsule, Rfl: 3:  Allergies  Allergen Reactions  . Topamax [Topiramate] Other (See Comments)    Cognitive clouding   :  Family History  Problem Relation Age of Onset  . Liver disease Mother   . Colon cancer Father   . Heart disease Father   . Esophageal cancer Neg Hx   . Rectal cancer Neg Hx   . Stomach cancer Neg Hx   :  Social History   Socioeconomic History  . Marital status: Married    Spouse name: Butch Penny  . Number of children: Not on file  . Years of education: Not on file  . Highest education level: Not on file  Occupational History  . Not on file  Tobacco Use  . Smoking status: Never Smoker  . Smokeless tobacco: Current User    Types: Chew  . Tobacco comment: chew 1 pack per day since he was 79 years old  Substance and Sexual Activity  . Alcohol use: No  . Drug use: Never   . Sexual activity: Not on file  Other Topics Concern  . Not on file  Social History Narrative   Lives with wife   Caffeine use: 1 cup coffee per day, 1 soda per day   Right-handed   Social Determinants of Health   Financial Resource Strain:   . Difficulty of Paying Living Expenses: Not on file  Food Insecurity:   . Worried About Charity fundraiser in the Last Year: Not on file  . Ran Out of Food in the Last Year: Not on file  Transportation Needs:   . Lack of Transportation (Medical): Not on file  . Lack of Transportation (Non-Medical): Not on file  Physical Activity:   . Days of Exercise  per Week: Not on file  . Minutes of Exercise per Session: Not on file  Stress:   . Feeling of Stress : Not on file  Social Connections:   . Frequency of Communication with Friends and Family: Not on file  . Frequency of Social Gatherings with Friends and Family: Not on file  . Attends Religious Services: Not on file  . Active Member of Clubs or Organizations: Not on file  . Attends Archivist Meetings: Not on file  . Marital Status: Not on file  Intimate Partner Violence:   . Fear of Current or Ex-Partner: Not on file  . Emotionally Abused: Not on file  . Physically Abused: Not on file  . Sexually Abused: Not on file  :  Pertinent items are noted in HPI.  Exam:  General appearance: alert and cooperative appeared without distress. Head: atraumatic without any abnormalities. Eyes: conjunctivae/corneas clear. PERRL.  Sclera anicteric. Throat: lips, mucosa, and tongue normal; without oral thrush or ulcers. Resp: clear to auscultation bilaterally without rhonchi, wheezes or dullness to percussion. Cardio: regular rate and rhythm, S1, S2 normal, no murmur, click, rub or gallop GI: soft, non-tender; bowel sounds normal; no masses,  no organomegaly Skin: Well-healed incision noted the middle of his back.  Subcutaneous nodules and protrusions noted cephalad of his scar.  He did  have also subcutaneous nodules noted on his left axilla but no lymphadenopathy. Lymph nodes: Cervical, supraclavicular, and axillary nodes normal. Neurologic: Grossly normal without any motor, sensory or deep tendon reflexes. Musculoskeletal: No joint deformity or effusion.  CBC    Component Value Date/Time   WBC 7.3 02/08/2019 1137   WBC 14.3 (H) 04/21/2018 0515   RBC 4.88 02/08/2019 1137   RBC 3.87 (L) 04/21/2018 0515   HGB 14.6 02/08/2019 1137   HCT 43.3 02/08/2019 1137   PLT 349 02/08/2019 1137   MCV 89 02/08/2019 1137   MCH 29.9 02/08/2019 1137   MCH 30.2 04/21/2018 0515   MCHC 33.7 02/08/2019 1137   MCHC 33.2 04/21/2018 0515   RDW 13.0 02/08/2019 1137   LYMPHSABS 1.4 02/08/2019 1137   EOSABS 0.1 02/08/2019 1137   BASOSABS 0.0 02/08/2019 1137     Chemistry      Component Value Date/Time   NA 142 02/08/2019 1137   K 4.6 02/08/2019 1137   CL 99 02/08/2019 1137   CO2 26 02/08/2019 1137   BUN 17 02/08/2019 1137   CREATININE 1.07 02/08/2019 1137      Component Value Date/Time   CALCIUM 9.9 02/08/2019 1137   ALKPHOS 65 02/08/2019 1137   AST 20 02/08/2019 1137   ALT 10 02/08/2019 1137   BILITOT 0.4 02/08/2019 1137        Assessment and Plan:    79 year old with:  1.  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  He was found to have T3b lesion after a shave biopsy.  He had developed a left mid upper back lesion and February 2021 that was biopsy-proven to be metastatic melanoma from his original primary.   The natural course of this disease at this time was reviewed.  Evaluation options as well as treatment choices were discussed today in detail.  The next course of action would be to obtain a PET scan for staging purposes to understand the extent of his disease.  Treatment options were reviewed at this time.  Given the systemic nature of his disease he will likely require systemic therapy.  He is  treatment options would include single  agent immunotherapy with nivolumab or Pembrolizumab, immunotherapy combination utilizing ipilimumab and nivolumab versus BRAF targeted therapy if he harbors the appropriate mutation.  Risks and benefits of these treatments were reviewed complications associated with these therapies were outlined at this time.  These treatments will be based on his extent of disease as well as BRAF status which we will obtain.  2.  Immune mediated complications: I discussed with him these in detail.  He is to include pneumonitis, colitis and thyroid disease.  He does have history of hypothyroidism and coordination with his endocrinologist may be needed.  3.  Follow-up: We will be in the immediate future after obtaining PET/CT scan   60  minutes was spent on this encounter.  The time was dedicated to reviewing his pathology reports, disease status update, treatment options and complications of therapy.     A copy of this consult has been forwarded to the requesting physician.

## 2019-09-12 ENCOUNTER — Telehealth: Payer: Self-pay | Admitting: Oncology

## 2019-09-12 NOTE — Telephone Encounter (Signed)
Scheduled per 2/19 los. Called and spoke with pt, confirmed 3/5 appt

## 2019-09-20 ENCOUNTER — Ambulatory Visit (HOSPITAL_COMMUNITY)
Admission: RE | Admit: 2019-09-20 | Discharge: 2019-09-20 | Disposition: A | Discharge status: Home / Self Care | Payer: Medicare Other | Source: Ambulatory Visit | Attending: Oncology | Admitting: Oncology

## 2019-09-20 ENCOUNTER — Other Ambulatory Visit: Payer: Self-pay

## 2019-09-20 DIAGNOSIS — C439 Malignant melanoma of skin, unspecified: Secondary | ICD-10-CM | POA: Diagnosis present

## 2019-09-20 DIAGNOSIS — M12861 Other specific arthropathies, not elsewhere classified, right knee: Secondary | ICD-10-CM | POA: Diagnosis not present

## 2019-09-20 DIAGNOSIS — I700 Atherosclerosis of aorta: Secondary | ICD-10-CM | POA: Insufficient documentation

## 2019-09-20 DIAGNOSIS — I2510 Atherosclerotic heart disease of native coronary artery without angina pectoris: Secondary | ICD-10-CM | POA: Insufficient documentation

## 2019-09-20 DIAGNOSIS — N200 Calculus of kidney: Secondary | ICD-10-CM | POA: Diagnosis not present

## 2019-09-20 DIAGNOSIS — J9811 Atelectasis: Secondary | ICD-10-CM | POA: Insufficient documentation

## 2019-09-20 DIAGNOSIS — M12862 Other specific arthropathies, not elsewhere classified, left knee: Secondary | ICD-10-CM | POA: Diagnosis not present

## 2019-09-20 DIAGNOSIS — K4020 Bilateral inguinal hernia, without obstruction or gangrene, not specified as recurrent: Secondary | ICD-10-CM | POA: Insufficient documentation

## 2019-09-20 LAB — GLUCOSE, CAPILLARY: Glucose-Capillary: 95 mg/dL (ref 70–99)

## 2019-09-20 MED ORDER — FLUDEOXYGLUCOSE F - 18 (FDG) INJECTION
8.2100 | Freq: Once | INTRAVENOUS | Status: AC
  Administered 2019-09-20: 8.21 via INTRAVENOUS

## 2019-09-23 ENCOUNTER — Other Ambulatory Visit: Payer: Self-pay | Admitting: Oncology

## 2019-09-23 ENCOUNTER — Other Ambulatory Visit: Payer: Self-pay

## 2019-09-23 ENCOUNTER — Inpatient Hospital Stay: Payer: Medicare Other | Attending: Oncology | Admitting: Oncology

## 2019-09-23 VITALS — BP 133/92 | HR 83 | Temp 98.3°F | Resp 18 | Ht 64.0 in | Wt 152.0 lb

## 2019-09-23 DIAGNOSIS — Z5112 Encounter for antineoplastic immunotherapy: Secondary | ICD-10-CM | POA: Insufficient documentation

## 2019-09-23 DIAGNOSIS — K219 Gastro-esophageal reflux disease without esophagitis: Secondary | ICD-10-CM | POA: Diagnosis not present

## 2019-09-23 DIAGNOSIS — I2510 Atherosclerotic heart disease of native coronary artery without angina pectoris: Secondary | ICD-10-CM | POA: Insufficient documentation

## 2019-09-23 DIAGNOSIS — Z7952 Long term (current) use of systemic steroids: Secondary | ICD-10-CM | POA: Diagnosis not present

## 2019-09-23 DIAGNOSIS — B029 Zoster without complications: Secondary | ICD-10-CM | POA: Diagnosis not present

## 2019-09-23 DIAGNOSIS — E0590 Thyrotoxicosis, unspecified without thyrotoxic crisis or storm: Secondary | ICD-10-CM | POA: Insufficient documentation

## 2019-09-23 DIAGNOSIS — C439 Malignant melanoma of skin, unspecified: Secondary | ICD-10-CM | POA: Diagnosis not present

## 2019-09-23 DIAGNOSIS — Z7982 Long term (current) use of aspirin: Secondary | ICD-10-CM | POA: Insufficient documentation

## 2019-09-23 DIAGNOSIS — R590 Localized enlarged lymph nodes: Secondary | ICD-10-CM | POA: Diagnosis not present

## 2019-09-23 DIAGNOSIS — N400 Enlarged prostate without lower urinary tract symptoms: Secondary | ICD-10-CM | POA: Diagnosis not present

## 2019-09-23 DIAGNOSIS — M1990 Unspecified osteoarthritis, unspecified site: Secondary | ICD-10-CM | POA: Diagnosis not present

## 2019-09-23 DIAGNOSIS — Z7189 Other specified counseling: Secondary | ICD-10-CM | POA: Insufficient documentation

## 2019-09-23 DIAGNOSIS — K4020 Bilateral inguinal hernia, without obstruction or gangrene, not specified as recurrent: Secondary | ICD-10-CM | POA: Insufficient documentation

## 2019-09-23 DIAGNOSIS — Z79899 Other long term (current) drug therapy: Secondary | ICD-10-CM | POA: Insufficient documentation

## 2019-09-23 DIAGNOSIS — C4359 Malignant melanoma of other part of trunk: Secondary | ICD-10-CM | POA: Insufficient documentation

## 2019-09-23 DIAGNOSIS — E785 Hyperlipidemia, unspecified: Secondary | ICD-10-CM | POA: Insufficient documentation

## 2019-09-23 MED ORDER — PROCHLORPERAZINE MALEATE 10 MG PO TABS: 10.0000 mg | ORAL_TABLET | Freq: Four times a day (QID) | ORAL | 0 refills | Status: AC | PRN

## 2019-09-23 NOTE — Progress Notes (Signed)
START ON PATHWAY REGIMEN - Melanoma and Other Skin Cancers     A cycle is 21 days:     Pembrolizumab   **Always confirm dose/schedule in your pharmacy ordering system**  Patient Characteristics: Melanoma, Cutaneous/Unknown Primary, Distant Metastases or Unresectable Local Recurrence, Unresectable, Asymptomatic, First Line, BRAF V600 Wild Type / BRAF V600 Results Pending or Unknown Disease Classification: Melanoma Disease Subtype: Cutaneous BRAF V600 Mutation Status: Awaiting BRAF V600 Results Therapeutic Status: Distant Metastases Metastatic Disease Type: Asymptomatic Line of Therapy: First Line Intent of Therapy: Non-Curative / Palliative Intent, Discussed with Patient

## 2019-09-23 NOTE — Progress Notes (Signed)
Hematology and Oncology Follow Up Visit  Jacob Conner [id number] 12/27/1940 78 y.o. 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   Prior Therapy: He is status post wide excision in May 2020 for a stage T3b melanoma of the upper back.  He is status post biopsy of multiple back lesions diagnosed in February 2021.  Current therapy: Under evaluation for systemic therapy.  Interim History: Mr. Valls returns today for a follow-up visit.  Since the last visit, he completed that PET scan for staging purposes.  He reports no major changes in his health since the last visit but does report chronic fatigue and tiredness.  He does report 30 pound weight loss in the last 6 months that is multifactorial in nature.  He does report an enlarging left axillary lymph node.    Medications: I have reviewed the patient's current medications.  Current Outpatient Medications  Medication Sig Dispense Refill  . AMBULATORY NON FORMULARY MEDICATION Take 180 mg by mouth daily. Medication Name: bempedoic acid 180 mgs vs a placebo, study drug provided    . aspirin EC 81 MG tablet Take 81 mg by mouth daily.    . dexamethasone (DECADRON) 1 MG tablet 2 tablets twice daily for 2 days, one tablet twice daily for 2 days, one tablet daily for 2 days. 15 tablet 0  . finasteride (PROSCAR) 5 MG tablet Take 1 tablet (5 mg total) by mouth daily. 90 tablet 3  . furosemide (LASIX) 20 MG tablet TAKE 1 TABLET BY MOUTH ONCE DAILY. 90 tablet 0  . hydroxypropyl methylcellulose / hypromellose (ISOPTO TEARS / GONIOVISC) 2.5 % ophthalmic solution Place 2 drops into both eyes as needed for dry eyes (IRRITATION).    . methimazole (TAPAZOLE) 5 MG tablet Take 5 mg by mouth daily.     . Vitamin D, Ergocalciferol, (DRISDOL) 1.25 MG (50000 UT) CAPS capsule Take 1  capsule (50,000 Units total) by mouth once a week. 12 capsule 3   No current facility-administered medications for this visit.     Allergies:  Allergies  Allergen Reactions  . Topamax [Topiramate] Other (See Comments)    Cognitive clouding     Past Medical History, Surgical history, Social history, and Family History were reviewed and updated.   Physical Exam: Blood pressure (!) 133/92, pulse 83, temperature 98.3 F (36.8 C), temperature source Temporal, resp. rate 18, height 5' 4" (1.626 m), weight 152 lb (68.9 kg), SpO2 96 %.   ECOG: 1   General appearance: Comfortable appearing without any discomfort Head: Normocephalic without any trauma Oropharynx: Mucous membranes are moist and pink without any thrush or ulcers. Eyes: Pupils are equal and round reactive to light. Lymph nodes: No cervical, supraclavicular, inguinal or axillary lymphadenopathy.   Heart:regular rate and rhythm.  S1 and S2 without leg edema. Lung: Clear without any rhonchi or wheezes.  No dullness to percussion. Abdomin: Soft, nontender, nondistended with good bowel sounds.  No hepatosplenomegaly. Musculoskeletal: No joint deformity or effusion.  Full range of motion noted. Neurological: No deficits noted on motor, sensory and deep tendon reflex exam. Skin: Subcutaneous nodules noted on the upper portion of his left back as well as left axillary lymphadenopathy.    Lab Results: Lab Results  Component Value Date   WBC 7.3 02/08/2019   HGB 14.6 02/08/2019   HCT 43.3 02/08/2019     MCV 89 02/08/2019   PLT 349 02/08/2019     Chemistry      Component Value Date/Time   NA 142 02/08/2019 1137   K 4.6 02/08/2019 1137   CL 99 02/08/2019 1137   CO2 26 02/08/2019 1137   BUN 17 02/08/2019 1137   CREATININE 1.07 02/08/2019 1137      Component Value Date/Time   CALCIUM 9.9 02/08/2019 1137   ALKPHOS 65 02/08/2019 1137   AST 20 02/08/2019 1137   ALT 10 02/08/2019 1137   BILITOT 0.4 02/08/2019 1137        Radiological Studies:  IMPRESSION: 1. Bandlike collection of over 10 small hypermetabolic subcutaneous tumors along the left upper back extending around to the left axilla where there are several abnormally hypermetabolic left axillary lymph nodes. The pattern of distribution resembles a string of beads. Appearance compatible with active malignancy with subcutaneous tumors and axillary adenopathy. Please note that if the patient has received a COVID-19 vaccine recently in the left upper extremity, that can also cause false positive axillary adenopathy. However, in this case, the presence of the subcutaneous lesions as well as the history of a left upper back melanoma correlating to the left upper back lesions appears definitive for malignant involvement. 2. Other imaging findings of potential clinical significance: Aortic Atherosclerosis (ICD10-I70.0). Coronary atherosclerosis. Atelectasis in the left lower lobe with mildly elevated left hemidiaphragm. Bilateral nonobstructive nephrolithiasis. Small bilateral indirect inguinal hernias contain adipose tissue. Degenerative arthropathy of both knees.   Impression and Plan:  79 year old man with:  1.    Stage IV documented in February 2021 after presenting with T3b superficial spreading melanoma of the upper back diagnosed in May 2020.    PET CT scan obtained on March 2 was reviewed today and discussed with the patient and his family.  He has clear stage IV disease although minimally symptomatic and not widespread at this time.  Treatment options were reviewed which includes single agent immunotherapy, immunotherapy doublets versus BRAF targeted therapy if he harbors appropriate mutation.  At this time I feel he would be a reasonable candidate for single agent Pembrolizumab administered every 3 weeks.  Complication associated with this therapy were reviewed again which include GI toxicity as well as dermatological issues and  immune mediated complications.  After discussion he is agreeable to proceed in the near future after education class.   2.  Immune mediated complications: He does have history of hyperthyroidism I will continue to monitor thyroid function at this time.  Other complications related pneumonitis, colitis also reiterated.  3.  Antiemetics: Prescription for Compazine will be made available to him.  4.  IV access: Peripheral veins will be in use for the time being Port-A-Cath option will be deferred.  5.  Follow-up: In the near future for the start of immunotherapy.  30  minutes were dedicated to this visit. The time was spent on reviewing imaging studies, discussing treatment options, and addressing complications of therapy.    Zola Button, MD 3/5/20212:51 PM

## 2019-09-26 ENCOUNTER — Telehealth: Payer: Self-pay | Admitting: Oncology

## 2019-09-26 NOTE — Telephone Encounter (Signed)
Scheduled appt per 3/5 los.  Spoke with pt and his spouse and they are aware of the appt dates and time

## 2019-09-28 ENCOUNTER — Inpatient Hospital Stay: Payer: Medicare Other

## 2019-10-04 ENCOUNTER — Inpatient Hospital Stay: Payer: Medicare Other

## 2019-10-04 ENCOUNTER — Other Ambulatory Visit: Payer: Self-pay

## 2019-10-04 ENCOUNTER — Other Ambulatory Visit: Payer: Self-pay | Admitting: Oncology

## 2019-10-04 ENCOUNTER — Encounter: Payer: Self-pay | Admitting: Oncology

## 2019-10-04 VITALS — BP 159/82 | HR 82 | Temp 98.7°F | Resp 18 | Wt 149.5 lb

## 2019-10-04 DIAGNOSIS — C439 Malignant melanoma of skin, unspecified: Secondary | ICD-10-CM

## 2019-10-04 DIAGNOSIS — C4359 Malignant melanoma of other part of trunk: Secondary | ICD-10-CM | POA: Diagnosis not present

## 2019-10-04 LAB — CBC WITH DIFFERENTIAL (CANCER CENTER ONLY)
Abs Immature Granulocytes: 0.02 10*3/uL (ref 0.00–0.07)
Basophils Absolute: 0 10*3/uL (ref 0.0–0.1)
Basophils Relative: 1 %
Eosinophils Absolute: 0.1 10*3/uL (ref 0.0–0.5)
Eosinophils Relative: 2 %
HCT: 41.9 % (ref 39.0–52.0)
Hemoglobin: 14 g/dL (ref 13.0–17.0)
Immature Granulocytes: 0 %
Lymphocytes Relative: 27 %
Lymphs Abs: 1.8 10*3/uL (ref 0.7–4.0)
MCH: 29.6 pg (ref 26.0–34.0)
MCHC: 33.4 g/dL (ref 30.0–36.0)
MCV: 88.6 fL (ref 80.0–100.0)
Monocytes Absolute: 0.5 10*3/uL (ref 0.1–1.0)
Monocytes Relative: 8 %
Neutro Abs: 4 10*3/uL (ref 1.7–7.7)
Neutrophils Relative %: 62 %
Platelet Count: 283 10*3/uL (ref 150–400)
RBC: 4.73 MIL/uL (ref 4.22–5.81)
RDW: 11.9 % (ref 11.5–15.5)
WBC Count: 6.5 10*3/uL (ref 4.0–10.5)
nRBC: 0 % (ref 0.0–0.2)

## 2019-10-04 LAB — CMP (CANCER CENTER ONLY)
ALT: 11 U/L (ref 0–44)
AST: 22 U/L (ref 15–41)
Albumin: 4.1 g/dL (ref 3.5–5.0)
Alkaline Phosphatase: 57 U/L (ref 38–126)
Anion gap: 8 (ref 5–15)
BUN: 17 mg/dL (ref 8–23)
CO2: 31 mmol/L (ref 22–32)
Calcium: 9.4 mg/dL (ref 8.9–10.3)
Chloride: 101 mmol/L (ref 98–111)
Creatinine: 0.86 mg/dL (ref 0.61–1.24)
GFR, Est AFR Am: >60 mL/min (ref >60)
GFR, Estimated: >60 mL/min (ref >60)
Glucose, Bld: 87 mg/dL (ref 70–99)
Potassium: 4.4 mmol/L (ref 3.5–5.1)
Sodium: 140 mmol/L (ref 135–145)
Total Bilirubin: 0.5 mg/dL (ref 0.3–1.2)
Total Protein: 6.5 g/dL (ref 6.5–8.1)

## 2019-10-04 LAB — LACTATE DEHYDROGENASE: LDH: 171 U/L (ref 98–192)

## 2019-10-04 LAB — TSH: TSH: 3.824 u[IU]/mL (ref 0.320–4.118)

## 2019-10-04 MED ORDER — SODIUM CHLORIDE 0.9 % IV SOLN
Freq: Once | INTRAVENOUS | Status: AC
  Filled 2019-10-04: qty 250

## 2019-10-04 MED ORDER — SODIUM CHLORIDE 0.9 % IV SOLN
200.0000 mg | Freq: Once | INTRAVENOUS | Status: AC
  Administered 2019-10-04: 200 mg via INTRAVENOUS
  Filled 2019-10-04: qty 8

## 2019-10-04 NOTE — Patient Instructions (Signed)
[facility] Discharge Instructions for Patients Receiving Chemotherapy  Today you received the following immunotherapy agents: Keytruda (pembrolizumab)  To help prevent nausea and vomiting after your treatment, we encourage you to take your nausea medication as needed.   If you develop nausea and vomiting that is not controlled by your nausea medication, call the clinic.   BELOW ARE SYMPTOMS THAT SHOULD BE REPORTED IMMEDIATELY:  *FEVER GREATER THAN 100.5 F  *CHILLS WITH OR WITHOUT FEVER  NAUSEA AND VOMITING THAT IS NOT CONTROLLED WITH YOUR NAUSEA MEDICATION  *UNUSUAL SHORTNESS OF BREATH  *UNUSUAL BRUISING OR BLEEDING  TENDERNESS IN MOUTH AND THROAT WITH OR WITHOUT PRESENCE OF ULCERS  *URINARY PROBLEMS  *BOWEL PROBLEMS  UNUSUAL RASH Items with * indicate a potential emergency and should be followed up as soon as possible.  Feel free to call the clinic should you have any questions or concerns. The clinic phone number is [phone].  Please show the [facility] at check-in to the Emergency Department and triage nurse.    Pembrolizumab injection What is this medicine? PEMBROLIZUMAB (pem broe liz ue mab) is a monoclonal antibody. It is used to treat certain types of cancer. This medicine may be used for other purposes; ask your health care provider or pharmacist if you have questions. COMMON BRAND NAME(S): Keytruda What should I tell my health care provider before I take this medicine? They need to know if you have any of these conditions:  diabetes  immune system problems  inflammatory bowel disease  liver disease  lung or breathing disease  lupus  received or scheduled to receive an organ transplant or a stem-cell transplant that uses donor stem cells  an unusual or allergic reaction to pembrolizumab, other medicines, foods, dyes, or preservatives  pregnant or trying to get pregnant  breast-feeding How should I use this  medicine? This medicine is for infusion into a vein. It is given by a health care professional in a hospital or clinic setting. A special MedGuide will be given to you before each treatment. Be sure to read this information carefully each time. Talk to your pediatrician regarding the use of this medicine in children. While this drug may be prescribed for children as young as 6 months for selected conditions, precautions do apply. Overdosage: If you think you have taken too much of this medicine contact a poison control center or emergency room at once. NOTE: This medicine is only for you. Do not share this medicine with others. What if I miss a dose? It is important not to miss your dose. Call your doctor or health care professional if you are unable to keep an appointment. What may interact with this medicine? Interactions have not been studied. Give your health care provider a list of all the medicines, herbs, non-prescription drugs, or dietary supplements you use. Also tell them if you smoke, drink alcohol, or use illegal drugs. Some items may interact with your medicine. This list may not describe all possible interactions. Give your health care provider a list of all the medicines, herbs, non-prescription drugs, or dietary supplements you use. Also tell them if you smoke, drink alcohol, or use illegal drugs. Some items may interact with your medicine. What should I watch for while using this medicine? Your condition will be monitored carefully while you are receiving this medicine. You may need blood work done while you are taking this medicine. Do not become pregnant while taking this medicine or for 4 months after stopping  it. Women should inform their doctor if they wish to become pregnant or think they might be pregnant. There is a potential for serious side effects to an unborn child. Talk to your health care professional or pharmacist for more information. Do not breast-feed an infant while  taking this medicine or for 4 months after the last dose. What side effects may I notice from receiving this medicine? Side effects that you should report to your doctor or health care professional as soon as possible:  allergic reactions like skin rash, itching or hives, swelling of the face, lips, or tongue  bloody or black, tarry  breathing problems  changes in vision  chest pain  chills  confusion  constipation  cough  diarrhea  dizziness or feeling faint or lightheaded  fast or irregular heartbeat  fever  flushing  joint pain  low blood counts - this medicine may decrease the number of white blood cells, red blood cells and platelets. You may be at increased risk for infections and bleeding.  muscle pain  muscle weakness  pain, tingling, numbness in the hands or feet  persistent headache  redness, blistering, peeling or loosening of the skin, including inside the mouth  signs and symptoms of high blood sugar such as dizziness; dry mouth; dry skin; fruity breath; nausea; stomach pain; increased hunger or thirst; increased urination  signs and symptoms of kidney injury like trouble passing urine or change in the amount of urine  signs and symptoms of liver injury like dark urine, light-colored stools, loss of appetite, nausea, right upper belly pain, yellowing of the eyes or skin  sweating  swollen lymph nodes  weight loss Side effects that usually do not require medical attention (report to your doctor or health care professional if they continue or are bothersome):  decreased appetite  hair loss  muscle pain  tiredness This list may not describe all possible side effects. Call your doctor for medical advice about side effects. You may report side effects to FDA at 1-800-FDA-1088. Where should I keep my medicine? This drug is given in a hospital or clinic and will not be stored at home. NOTE: This sheet is a summary. It may not cover all  possible information. If you have questions about this medicine, talk to your doctor, pharmacist, or health care provider.  2020 Elsevier/Gold Standard (2019-05-13 18:07:58)

## 2019-10-04 NOTE — Progress Notes (Signed)
Met with patient in treatment area to introduce myself as Arboriculturist and to offer available resources.  Discussed one-time $1000 Radio broadcast assistant to assist with gas cards and other personal expenses while going through treatment.  Also gave him an application for the [employer] whom helps patients whom live in [place] only.  Gave him my card if interested for any additional financial questions or concerns.

## 2019-10-05 ENCOUNTER — Telehealth: Payer: Self-pay | Admitting: *Deleted

## 2019-10-13 ENCOUNTER — Telehealth: Payer: Self-pay | Admitting: Physician Assistant

## 2019-10-13 ENCOUNTER — Telehealth: Payer: Self-pay

## 2019-10-13 ENCOUNTER — Other Ambulatory Visit: Payer: Self-pay

## 2019-10-13 ENCOUNTER — Other Ambulatory Visit: Payer: Self-pay | Admitting: Oncology

## 2019-10-13 ENCOUNTER — Telehealth: Payer: Self-pay | Admitting: Oncology

## 2019-10-13 MED ORDER — TRAMADOL HCL 50 MG PO TABS: 50.0000 mg | ORAL_TABLET | Freq: Four times a day (QID) | ORAL | 0 refills | Status: AC | PRN

## 2019-10-13 MED ORDER — FAMCICLOVIR 500 MG PO TABS: 500.0000 mg | ORAL_TABLET | Freq: Three times a day (TID) | ORAL | 0 refills | Status: DC

## 2019-10-13 MED ORDER — ACYCLOVIR 400 MG PO TABS: 800.0000 mg | ORAL_TABLET | Freq: Every day | ORAL | 0 refills | Status: AC

## 2019-10-13 NOTE — Telephone Encounter (Signed)
Called patient's wife and made her aware of Dr. Hazeline Junker instructions and that prescriptions have been sent to [facility]. Patient's wife also aware to expect a call from scheduling to set up an appointment for patient to be seen in Symptom Management tomorrow.

## 2019-10-13 NOTE — Progress Notes (Signed)
Received a call from Lance Bosch at [facility] stating that they do not carry Famciclovir and neither do other pharmacies in the area. Pharmacist requested prescription be changed to Acyclovir 800 mg five times daily times seven days. Dr. Alen Blew made aware and gave ok to send in new prescription for Acyclovir.

## 2019-10-13 NOTE — Telephone Encounter (Signed)
-----   Message from Wyatt Portela, MD sent at 10/13/2019  9:18 AM EDT ----- Please let him know to pick up the prescription I sent to his pharmacy:  Famciclovir for shingles to take 3 times a day for a week. Tramadol for pain to use as needed.  He can use at nighttime to help with sleep.  He also need to be seen by Lucianne Lei on 3/26 for follow-up.  Thanks ----- Message ----- From: Tami Lin, RN Sent: 10/13/2019   8:49 AM EDT To: Wyatt Portela, MD  Patient received D1 C1 Keytruda on 10/04/19. Patient's wife called and stated patient had right flank discomfort the past two days and has been unable to sleep at night due to the discomfort. She said this morning she noticed a red rash in that area that has the pattern of shingles.  She said she is a Marine scientist and is pretty sure it is shingles. She called patient's PCP and was told they are not seeing patient's with rashes or who may be sick. Patient's wife is also requesting something to help the patient sleep at night due to the discomfort. Lucianne Lei is out of the office today but will be back tomorrow.  Lanelle Bal

## 2019-10-13 NOTE — Telephone Encounter (Signed)
Scheduled symptom management for 3/26 per 3/25 sch msg. Called and spoke with patients wife. Confirmed appt

## 2019-10-14 ENCOUNTER — Inpatient Hospital Stay (HOSPITAL_BASED_OUTPATIENT_CLINIC_OR_DEPARTMENT_OTHER): Payer: Medicare Other | Admitting: Medical

## 2019-10-14 ENCOUNTER — Other Ambulatory Visit: Payer: Self-pay

## 2019-10-14 VITALS — BP 143/76 | HR 85 | Temp 98.3°F | Resp 20 | Ht 64.0 in | Wt 148.4 lb

## 2019-10-14 DIAGNOSIS — C439 Malignant melanoma of skin, unspecified: Secondary | ICD-10-CM

## 2019-10-14 DIAGNOSIS — B029 Zoster without complications: Secondary | ICD-10-CM | POA: Diagnosis not present

## 2019-10-14 DIAGNOSIS — C4359 Malignant melanoma of other part of trunk: Secondary | ICD-10-CM | POA: Diagnosis not present

## 2019-10-14 MED ORDER — GABAPENTIN 100 MG PO CAPS: 200.0000 mg | ORAL_CAPSULE | Freq: Every day | ORAL | 0 refills | Status: DC

## 2019-10-14 MED ORDER — PREDNISONE 5 MG PO TABS: ORAL_TABLET | ORAL | 0 refills | Status: DC

## 2019-10-14 NOTE — Patient Instructions (Signed)
Shingles  Shingles is an infection. It gives you a painful skin rash and blisters that have fluid in them. Shingles is caused by the same germ (virus) that causes chickenpox. Shingles only happens in people who:  Have had chickenpox.  Have been given a shot of medicine (vaccine) to protect against chickenpox. Shingles is rare in this group. The first symptoms of shingles may be itching, tingling, or pain in an area on your skin. A rash will show on your skin a few days or weeks later. The rash is likely to be on one side of your body. The rash usually has a shape like a belt or a band. Over time, the rash turns into fluid-filled blisters. The blisters will break open, change into scabs, and dry up. Medicines may:  Help with pain and itching.  Help you get better sooner.  Help to prevent long-term problems. Follow these instructions at home: Medicines  Take over-the-counter and prescription medicines only as told by your doctor.  Put on an anti-itch cream or numbing cream where you have a rash, blisters, or scabs. Do this as told by your doctor. Helping with itching and discomfort   Put cold, wet cloths (cold compresses) on the area of the rash or blisters as told by your doctor.  Cool baths can help you feel better. Try adding baking soda or dry oatmeal to the water to lessen itching. Do not bathe in hot water. Blister and rash care  Keep your rash covered with a loose bandage (dressing).  Wear loose clothing that does not rub on your rash.  Keep your rash and blisters clean. To do this, wash the area with mild soap and cool water as told by your doctor.  Check your rash every day for signs of infection. Check for: ? More redness, swelling, or pain. ? Fluid or blood. ? Warmth. ? Pus or a bad smell.  Do not scratch your rash. Do not pick at your blisters. To help you to not scratch: ? Keep your fingernails clean and cut short. ? Wear gloves or mittens when you sleep, if  scratching is a problem. General instructions  Rest as told by your doctor.  Keep all follow-up visits as told by your doctor. This is important.  Wash your hands often with soap and water. If soap and water are not available, use hand sanitizer. Doing this lowers your chance of getting a skin infection caused by germs (bacteria).  Your infection can cause chickenpox in people who have never had chickenpox or never got a shot of chickenpox vaccine. If you have blisters that did not change into scabs yet, try not to touch other people or be around other people, especially: ? Babies. ? Pregnant women. ? Children who have areas of red, itchy, or rough skin (eczema). ? Very old people who have transplants. ? People who have a long-term (chronic) sickness, like cancer or AIDS. Contact a doctor if:  Your pain does not get better with medicine.  Your pain does not get better after the rash heals.  You have any signs of infection in the rash area. These signs include: ? More redness, swelling, or pain around the rash. ? Fluid or blood coming from the rash. ? The rash area feeling warm to the touch. ? Pus or a bad smell coming from the rash. Get help right away if:  The rash is on your face or nose.  You have pain in your face or pain by   your eye.  You lose feeling on one side of your face.  You have trouble seeing.  You have ear pain, or you have ringing in your ear.  You have a loss of taste.  Your condition gets worse. Summary  Shingles gives you a painful skin rash and blisters that have fluid in them.  Shingles is an infection. It is caused by the same germ (virus) that causes chickenpox.  Keep your rash covered with a loose bandage (dressing). Wear loose clothing that does not rub on your rash.  If you have blisters that did not change into scabs yet, try not to touch other people or be around people. This information is not intended to replace advice given to you by  your health care provider. Make sure you discuss any questions you have with your health care provider. Document Revised: 10/29/2018 Document Reviewed: 03/11/2017 Elsevier Patient Education  2020 Elsevier Inc.  

## 2019-10-17 ENCOUNTER — Telehealth: Payer: Self-pay | Admitting: Emergency Medicine

## 2019-10-17 ENCOUNTER — Telehealth: Payer: Self-pay | Admitting: Medical

## 2019-10-17 ENCOUNTER — Inpatient Hospital Stay (HOSPITAL_BASED_OUTPATIENT_CLINIC_OR_DEPARTMENT_OTHER): Payer: Medicare Other | Admitting: Medical

## 2019-10-17 ENCOUNTER — Other Ambulatory Visit: Payer: Self-pay

## 2019-10-17 VITALS — BP 139/78 | HR 75 | Temp 98.2°F | Resp 17 | Ht 64.0 in | Wt 148.8 lb

## 2019-10-17 DIAGNOSIS — C439 Malignant melanoma of skin, unspecified: Secondary | ICD-10-CM

## 2019-10-17 DIAGNOSIS — C4359 Malignant melanoma of other part of trunk: Secondary | ICD-10-CM | POA: Diagnosis not present

## 2019-10-17 DIAGNOSIS — B029 Zoster without complications: Secondary | ICD-10-CM | POA: Diagnosis not present

## 2019-10-17 NOTE — Patient Instructions (Signed)

## 2019-10-17 NOTE — Telephone Encounter (Signed)
Received VM from pt's wife requesting to speak to PA Lucianne Lei to f/u on [facility] appt last week, states they have questions for the provider.  PA Lucianne Lei made aware and stated he would call pt's wife back.

## 2019-10-17 NOTE — Progress Notes (Signed)
[facility] Progress Note   CAMRYNN MACARI [id number] [birth year] 79 y.o.  AUSAR RIGLER is managed by Dr. 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 Poirot continues to be managed by Dr. Alen Blew and is status post cycle 1 of Keytruda which was dosed on 10/04/2019.  He will be seen by Dr. Alen Blew in follow-up on 10/25/2019.  Dr. Zola Button will reevaluate the areas of tumor in his left posterior upper back.  He discussed with the patient and his wife today that he would plan on continuing with his next cycle of Keytruda at his next visit.  He will reevaluate the areas of disease on his return and will make a decision whether to continue with his current treatment, add another agent to his regimen or transitioning to a different chemotherapy.  Herpetic zoster of the right flank and right anterior abdomen: The patient reports that he is doing significantly better and has only taken 1 dose of tramadol since last week.  Please see After Visit Summary for patient specific instructions.  Future Appointments  Date Time Provider Speed  10/24/2019 12:00 PM [facility] ONLY [facility] None  10/25/2019  8:30 AM Wyatt Portela, MD [facility] None  10/25/2019  9:00 AM [facility] INFUSION [facility] None  11/08/2019  9:00 AM [place] [facility] None  11/15/2019  8:00 AM [facility] LAB 1 [facility] None  11/15/2019  8:30 AM Shadad, Mathis Dad, MD [facility] None  11/15/2019  9:00 AM [facility] INFUSION [facility] None    No orders of the defined types were placed in this encounter.      Subjective:   Patient ID:  AYDIN BANO is a 79 y.o. (DOB 01/13/1941) male.  Chief  Complaint:  Chief Complaint  Patient presents with  . Follow-up    HPI DENIRO QADIR  is a 79 y.o. male with a diagnosis of melanoma.  He continues to be managed by Dr. Alen Blew and is status post cycle 1 of Keytruda which was dosed on 10/04/2019.  Mr. Abonce was seen last week and was diagnosed with a hepatic zoster of the right flank and right anterior abdomen.he continues on acyclovir, a low-dose prednisone taper, and gabapentin 200 mg p.o. nightly.  He is doing significantly better and has only taken 1 dose of tramadol since last week.  He presents to the clinic today with his wife.  She is concerned that the hyperpigmented raised lesions on his left upper back are enlarging and have now extended to his left axilla.  He has had an area of lymphadenopathy in his left axilla but it appears that by his report that this area is progressing and size.  He denies fevers, chills, chest pain, or shortness of breath.  Medications: I have reviewed the patient's current medications.  Allergies:  Allergies  Allergen Reactions  . Topamax [Topiramate] Other (See Comments)    Cognitive clouding     Past Medical History:  Diagnosis Date  . Arthritis    "pretty much all over; hands, shoulders, knees, back" (04/21/2018)  . BPH (benign prostatic hyperplasia)   . Carotid artery occlusion   . 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 Dyspnea  on exertion  . GERD (gastroesophageal reflux disease)   . Graves' disease dx'd 2015   "on daily RX" (04/21/2018)  . History of kidney stones   . Hyperlipidemia   . Internal hemorrhoids   . Melanoma ([place])    "cut off back and arm; total of 4" (04/21/2018)  . Prostate disorder   . Seasonal allergies    "hay fever in spring" (04/21/2018)  . Stroke [facility])    ?,  pt nor family  was not aware of having a stroke/frontal lobe infarct (04/21/2018)  . Thyroid disease     Hyperthyroidism  and Graves  . Tubular adenoma of colon     Past Surgical History:  Procedure Laterality Date  . ANTERIOR LAT LUMBAR FUSION Left 04/20/2018   Procedure: Lumbar two-three Lumbar three-four Lumbar four-five Anterolateral decompression with posterior percutaneous arthrodesis, MAZOR;  Surgeon: Kristeen Miss, MD;  Location: [place];  Service: Neurosurgery;  Laterality: Left;  . APPLICATION OF ROBOTIC ASSISTANCE FOR SPINAL PROCEDURE N/A 04/20/2018   Procedure: APPLICATION OF ROBOTIC ASSISTANCE FOR SPINAL PROCEDURE;  Surgeon: Kristeen Miss, MD;  Location: [place];  Service: Neurosurgery;  Laterality: N/A;  . BACK SURGERY    . COLONOSCOPY  2019  . ENDARTERECTOMY Right 07/06/2017   Procedure: ENDARTERECTOMY CAROTID RIGHT;  Surgeon: Waynetta Sandy, MD;  Location: [place];  Service: Vascular;  Laterality: Right;  . ESOPHAGOGASTRODUODENOSCOPY    . HYDROCELE EXCISION    . LITHOTRIPSY  X 2  . LUMBAR PERCUTANEOUS PEDICLE SCREW 3 LEVEL N/A 04/20/2018   Procedure: LUMBAR PERCUTANEOUS PEDICLE SCREW THREE LEVEL;  Surgeon: Kristeen Miss, MD;  Location: [place];  Service: Neurosurgery;  Laterality: N/A;  . MELANOMA EXCISION  X 4   "back, right arm"  . PATCH ANGIOPLASTY Right 07/06/2017   Procedure: PATCH ANGIOPLASTY RIGHT CAROTID ARTERY;  Surgeon: Waynetta Sandy, MD;  Location: [place];  Service: Vascular;  Laterality: Right;    Family History  Problem Relation Age of Onset  . Liver disease Mother   . Colon cancer Father   . Heart disease Father   . Esophageal cancer Neg Hx   . Rectal cancer Neg Hx   . Stomach cancer Neg Hx     Social History   Socioeconomic History  . Marital status: Married    Spouse name: Butch Penny  . Number of children: Not on file  . Years of education: Not on file  . Highest education level: Not on file  Occupational History  . Not on file  Tobacco Use  . Smoking status: Never Smoker  . Smokeless tobacco: Current User    Types: Chew  . Tobacco  comment: chew 1 pack per day since he was 79 years old  Substance and Sexual Activity  . Alcohol use: No  . Drug use: Never  . Sexual activity: Not on file  Other Topics Concern  . Not on file  Social History Narrative   Lives with wife   Caffeine use: 1 cup coffee per day, 1 soda per day   Right-handed   Social Determinants of Health   Financial Resource Strain:   . Difficulty of Paying Living Expenses:   Food Insecurity:   . Worried About Charity fundraiser in the Last Year:   . Arboriculturist in the Last Year:   Transportation Needs:   . Film/video editor (Medical):   Marland Kitchen Lack of Transportation (Non-Medical):   Physical Activity:   . Days of Exercise per Week:   .  Minutes of Exercise per Session:   Stress:   . Feeling of Stress :   Social Connections:   . Frequency of Communication with Friends and Family:   . Frequency of Social Gatherings with Friends and Family:   . Attends Religious Services:   . Active Member of Clubs or Organizations:   . Attends Archivist Meetings:   Marland Kitchen Marital Status:   Intimate Partner Violence:   . Fear of Current or Ex-Partner:   . Emotionally Abused:   Marland Kitchen Physically Abused:   . Sexually Abused:     Past Medical History, Surgical history, Social history, and Family history were reviewed and updated as appropriate.   Please see review of systems for further details on the patient's review from today.   Review of Systems:  Review of Systems  Constitutional: Negative for chills, diaphoresis and fever.  HENT: Negative for facial swelling and trouble swallowing.   Respiratory: Negative for cough, chest tightness and shortness of breath.   Cardiovascular: Negative for chest pain.  Skin: Positive for rash.  Hematological: Positive for adenopathy.    Objective:   Physical Exam:  BP 139/78 (BP Location: Left Arm, Patient Position: Sitting)   Pulse 75   Temp 98.2 F (36.8 C) (Temporal)   Resp 17   Ht 5\' 4"  (1.626 m)    Wt 148 lb 12.8 oz (67.5 kg)   SpO2 98%   BMI 25.54 kg/m  ECOG: 0  Physical Exam Constitutional:      General: He is not in acute distress.    Appearance: He is not diaphoretic.  HENT:     Head: Normocephalic and atraumatic.  Eyes:     General: No scleral icterus.       Right eye: No discharge.        Left eye: No discharge.  Cardiovascular:     Heart sounds: No gallop.   Lymphadenopathy:     Upper Body:     Left upper body: Axillary adenopathy (Left axillary nodes are significantly enlarged and hard.) present.  Skin:    General: Skin is warm and dry.     Findings: Erythema, lesion and rash present.     Comments: Multiple raised, hard, hyperpigmented lesions were noted over the patient's left upper back.  These areas extend to his left axilla.  There is a drying erythematous rash along the right flank to the right groin area at the level of the belt line that is consistent with a resolving herpetic zoster.  Neurological:     Mental Status: He is alert.     Coordination: Coordination normal.     Gait: Gait normal.  Psychiatric:        Mood and Affect: Mood normal.        Behavior: Behavior normal.        Thought Content: Thought content normal.        Judgment: Judgment normal.         Lab Review:     Component Value Date/Time   NA 140 10/04/2019 0817   NA 142 02/08/2019 1137   K 4.4 10/04/2019 0817   CL 101 10/04/2019 0817   CO2 31 10/04/2019 0817   GLUCOSE 87 10/04/2019 0817   BUN 17 10/04/2019 0817   BUN 17 02/08/2019 1137   CREATININE 0.86 10/04/2019 0817   CALCIUM 9.4 10/04/2019 0817   PROT 6.5 10/04/2019 0817   PROT 6.4 02/08/2019 1137   ALBUMIN 4.1 10/04/2019 0817   ALBUMIN 4.5  02/08/2019 1137   AST 22 10/04/2019 0817   ALT 11 10/04/2019 0817   ALKPHOS 57 10/04/2019 0817   BILITOT 0.5 10/04/2019 0817   GFRNONAA >60 10/04/2019 0817   GFRAA >60 10/04/2019 0817       Component Value Date/Time   WBC 6.5 10/04/2019 0817   WBC 14.3 (H) 04/21/2018  0515   RBC 4.73 10/04/2019 0817   HGB 14.0 10/04/2019 0817   HGB 14.6 02/08/2019 1137   HCT 41.9 10/04/2019 0817   HCT 43.3 02/08/2019 1137   PLT 283 10/04/2019 0817   PLT 349 02/08/2019 1137   MCV 88.6 10/04/2019 0817   MCV 89 02/08/2019 1137   MCH 29.6 10/04/2019 0817   MCHC 33.4 10/04/2019 0817   RDW 11.9 10/04/2019 0817   RDW 13.0 02/08/2019 1137   LYMPHSABS 1.8 10/04/2019 0817   LYMPHSABS 1.4 02/08/2019 1137   MONOABS 0.5 10/04/2019 0817   EOSABS 0.1 10/04/2019 0817   EOSABS 0.1 02/08/2019 1137   BASOSABS 0.0 10/04/2019 0817   BASOSABS 0.0 02/08/2019 1137   -------------------------------  Imaging from last 24 hours (if applicable):  Radiology interpretation: NM PET Image Initial (PI) Whole Body  Result Date: 09/20/2019 CLINICAL DATA:  Initial treatment strategy for melanoma of the left mid upper back. EXAM: NUCLEAR MEDICINE PET WHOLE BODY TECHNIQUE: 8.2 mCi F-18 FDG was injected intravenously. Full-ring PET imaging was performed from the skull vertex through the feet after the radiotracer. CT data was obtained and used for attenuation correction and anatomic localization. Fasting blood glucose: 95 mg/dl COMPARISON:  CT abdomen/pelvis from 08/06/2003 FINDINGS: Mediastinal blood pool activity: SUV max 2.2 HEAD/NECK: No significant abnormal hypermetabolic activity in this region. Incidental CT findings: Right common carotid atherosclerotic calcification. CHEST: There is a bandlike collection of over 10 small hypermetabolic subcutaneous tumors resembling a string of beads extending from the left upper back around 2 the left axilla towards several abnormally enlarged left axillary lymph nodes. The lower medial subcutaneous tumor in this vicinity extends to the cutaneous margin at measures 2.3 by 1.3 cm on image 89/4, with maximum SUV of 17.5. An index left axillary lymph node measuring 1.5 cm in short axis also on image 89/4 has a maximum SUV of 16.2. Incidental CT findings: Coronary,  aortic arch, and branch vessel atherosclerotic vascular disease. Mildly elevated left hemidiaphragm with atelectasis in the left lower lobe. Linear subsegmental atelectasis or scarring medially in the right middle lobe. ABDOMEN/PELVIS: No significant abnormal hypermetabolic activity in this region. Incidental CT findings: Bilateral nonobstructive nephrolithiasis. Aortoiliac atherosclerotic vascular disease. Small bilateral indirect inguinal hernias contain adipose tissue. SKELETON: No significant abnormal hypermetabolic activity in this region. Incidental CT findings: Degenerative arthropathy in the knees, with lateral patellofemoral articular space narrowing, left greater than right. Multilevel postoperative findings in the lumbar spine. EXTREMITIES: No significant abnormal hypermetabolic activity in this region. Incidental CT findings: Small bilateral knee joint effusions. Peripheral vascular atherosclerotic calcifications. IMPRESSION: 1. Bandlike collection of over 10 small hypermetabolic subcutaneous tumors along the left upper back extending around to the left axilla where there are several abnormally hypermetabolic left axillary lymph nodes. The pattern of distribution resembles a string of beads. Appearance compatible with active malignancy with subcutaneous tumors and axillary adenopathy. Please note that if the patient has received a COVID-19 vaccine recently in the left upper extremity, that can also cause false positive axillary adenopathy. However, in this case, the presence of the subcutaneous lesions as well as the history of a left upper back melanoma correlating to  the left upper back lesions appears definitive for malignant involvement. 2. Other imaging findings of potential clinical significance: Aortic Atherosclerosis (ICD10-I70.0). Coronary atherosclerosis. Atelectasis in the left lower lobe with mildly elevated left hemidiaphragm. Bilateral nonobstructive nephrolithiasis. Small bilateral  indirect inguinal hernias contain adipose tissue. Degenerative arthropathy of both knees. Electronically Signed   By: Reese Stockman Clines M.D.   On: 09/20/2019 16:10        This patient was seen with Dr. Alen Blew with my treatment plan reviewed with him. He expressed agreement with my medical management of this patient.   Oncology attending addendum:  Patient seen examined personally.  He has reports increased left-sided axillary lymphadenopathy although unrelated to his shingles rash.  His rash has continued to improve at this time.  On physical exam he still have palpable left axillary lymphadenopathy as well as subcutaneous nodules noted in the left mid axillary and scapular line.  These findings suggest disease progression although he is just recently started on treatment.  I will assess him in 1 week and consider adding ipilimumab to Pembrolizumab if needed in the future.  For the time being we will continue with his current regimen.   30  minutes were dedicated to this visit. The time was spent on reviewing laboratory data, imaging studies, discussing treatment options, discussing differential diagnosis and answering questions regarding future plan.   Zola Button MD 10/18/2019

## 2019-10-17 NOTE — Progress Notes (Signed)
Shingles rash improved from last [facility] visit, pt denies any pain/aching/tenderness at rash sites.  Skin remains intact, no drainage/bleeding from sites.  Afebrile.  Denies CP/SOB.  Pt reports continuing to take acyclovir as prescribed.

## 2019-10-17 NOTE — Progress Notes (Signed)
[facility] Progress Note   Jacob Conner [id number] 12/09/1940 79 y.o.  DARAL Conner is managed by Dr. Zola Button  Actively treated with chemotherapy/immunotherapy/hormonal therapy: yes  Current therapy: Keytruda  Last treated: 10/04/2019 (cycle 1)  Next scheduled appointment with provider: 10/25/2019  Assessment: Plan:    Melanoma of skin ([place])  Herpes zoster without complication   Metastatic melanoma.  Mr. Bazinet is managed by Dr. Alen Blew and is status post cycle 1 of Keytruda which was given on 10/04/2019.  Shingles: The patient was told to continue taking acyclovir and tramadol as prescribed.  He was given a low dose 6-day prednisone taper and was begun on Neurontin 200 mg p.o. nightly x21 days.  Please see After Visit Summary for patient specific instructions.  Future Appointments  Date Time Provider [place]  10/24/2019 12:00 PM [facility] ONLY [facility] None  10/25/2019  8:30 AM Wyatt Portela, MD [facility] None  10/25/2019  9:00 AM [facility] INFUSION [facility] None  11/08/2019  9:00 AM [place] [facility] None  11/15/2019  8:00 AM [facility] LAB 1 [facility] None  11/15/2019  8:30 AM Shadad, Mathis Dad, MD [facility] None  11/15/2019  9:00 AM [facility] INFUSION [facility] None    No orders of the defined types were placed in this encounter.      Subjective:   Patient ID:  Jacob Conner is a 79 y.o. (DOB Jun 18, 1941) male.  Chief Complaint:  Chief Complaint  Patient presents with  . Rash    HPI JARQUAVIOUS Conner  is a 79 y.o. male with a diagnosis of a stage IV melanoma which was diagnosed originally in February 2021 after presenting with superficial spreading initial lesion T3b in May 2020.  He presents to the clinic today with an erythematous burning rash over his right back with extension to his right groin since Thursday.  He was started on acyclovir and tramadol yesterday.  He has had 1  shingles vaccination thus far but did not get the second dose as yet due to COVID-19.  He denies fevers, chills, chest pain, shortness of breath, or difficulty swallowing.  Medications: I have reviewed the patient's current medications.  Allergies:  Allergies  Allergen Reactions  . Topamax [Topiramate] Other (See Comments)    Cognitive clouding     Past Medical History:  Diagnosis Date  . Arthritis    "pretty much all over; hands, shoulders, knees, back" (04/21/2018)  . BPH (benign prostatic hyperplasia)   . Carotid artery occlusion   . 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 Dyspnea    on exertion  . GERD (gastroesophageal reflux disease)   . Graves' disease dx'd 2015   "on daily RX" (04/21/2018)  . History of kidney stones   . Hyperlipidemia   . Internal hemorrhoids   . Melanoma ([place])    "cut off back and arm; total of 4" (04/21/2018)  . Prostate disorder   . Seasonal allergies    "hay fever in spring" (04/21/2018)  . Stroke [facility])    ?,  pt nor family  was not aware of having a stroke/frontal lobe infarct (04/21/2018)  . Thyroid disease    Hyperthyroidism  and Graves  . Tubular adenoma of colon     Past Surgical History:  Procedure Laterality Date  . ANTERIOR LAT LUMBAR FUSION Left 04/20/2018   Procedure: Lumbar two-three Lumbar three-four Lumbar four-five Anterolateral decompression with  posterior percutaneous arthrodesis, MAZOR;  Surgeon: Kristeen Miss, MD;  Location: [place];  Service: Neurosurgery;  Laterality: Left;  . APPLICATION OF ROBOTIC ASSISTANCE FOR SPINAL PROCEDURE N/A 04/20/2018   Procedure: APPLICATION OF ROBOTIC ASSISTANCE FOR SPINAL PROCEDURE;  Surgeon: Kristeen Miss, MD;  Location: [place];  Service: Neurosurgery;  Laterality: N/A;  . BACK SURGERY    . COLONOSCOPY  2019  . ENDARTERECTOMY Right 07/06/2017   Procedure: ENDARTERECTOMY CAROTID RIGHT;  Surgeon: Waynetta Sandy, MD;  Location: [place];  Service: Vascular;  Laterality: Right;  . ESOPHAGOGASTRODUODENOSCOPY    . HYDROCELE EXCISION    . LITHOTRIPSY  X 2  . LUMBAR PERCUTANEOUS PEDICLE SCREW 3 LEVEL N/A 04/20/2018   Procedure: LUMBAR PERCUTANEOUS PEDICLE SCREW THREE LEVEL;  Surgeon: Kristeen Miss, MD;  Location: [place];  Service: Neurosurgery;  Laterality: N/A;  . MELANOMA EXCISION  X 4   "back, right arm"  . PATCH ANGIOPLASTY Right 07/06/2017   Procedure: PATCH ANGIOPLASTY RIGHT CAROTID ARTERY;  Surgeon: Waynetta Sandy, MD;  Location: [place];  Service: Vascular;  Laterality: Right;    Family History  Problem Relation Age of Onset  . Liver disease Mother   . Colon cancer Father   . Heart disease Father   . Esophageal cancer Neg Hx   . Rectal cancer Neg Hx   . Stomach cancer Neg Hx     Social History   Socioeconomic History  . Marital status: Married    Spouse name: Butch Penny  . Number of children: Not on file  . Years of education: Not on file  . Highest education level: Not on file  Occupational History  . Not on file  Tobacco Use  . Smoking status: Never Smoker  . Smokeless tobacco: Current User    Types: Chew  . Tobacco comment: chew 1 pack per day since he was 79 years old  Substance and Sexual Activity  . Alcohol use: No  . Drug use: Never  . Sexual activity: Not on file  Other Topics Concern  . Not on file  Social History Narrative   Lives with wife   Caffeine use: 1 cup coffee per day, 1 soda per day   Right-handed   Social Determinants of Health   Financial Resource Strain:   . Difficulty of Paying Living Expenses:   Food Insecurity:   . Worried About Charity fundraiser in the Last Year:   . Arboriculturist in the Last Year:   Transportation Needs:   . Film/video editor (Medical):   Marland Kitchen Lack of Transportation (Non-Medical):   Physical Activity:   . Days of Exercise per Week:   . Minutes of Exercise per Session:   Stress:   . Feeling of  Stress :   Social Connections:   . Frequency of Communication with Friends and Family:   . Frequency of Social Gatherings with Friends and Family:   . Attends Religious Services:   . Active Member of Clubs or Organizations:   . Attends Archivist Meetings:   Marland Kitchen Marital Status:   Intimate Partner Violence:   . Fear of Current or Ex-Partner:   . Emotionally Abused:   Marland Kitchen Physically Abused:   . Sexually Abused:     Past Medical History, Surgical history, Social history, and Family history were reviewed and updated as appropriate.   Please see review of systems for further details on the patient's review from today.   Review of Systems:  Review  of Systems  Constitutional: Negative for chills, diaphoresis and fever.  HENT: Negative for trouble swallowing and voice change.   Respiratory: Negative for cough, chest tightness, shortness of breath and wheezing.   Cardiovascular: Negative for chest pain and palpitations.  Gastrointestinal: Negative for abdominal pain, constipation, diarrhea, nausea and vomiting.  Musculoskeletal: Negative for back pain and myalgias.  Skin: Positive for rash.  Neurological: Negative for dizziness, light-headedness and headaches.    Objective:   Physical Exam:  BP (!) 143/76 (Patient Position: Sitting)   Pulse 85   Temp 98.3 F (36.8 C) (Temporal)   Resp 20   Ht 5\' 4"  (1.626 m)   Wt 148 lb 6.4 oz (67.3 kg)   SpO2 97%   BMI 25.47 kg/m  ECOG: 0  Physical Exam Constitutional:      General: He is not in acute distress.    Appearance: He is not diaphoretic.  HENT:     Head: Normocephalic and atraumatic.  Cardiovascular:     Rate and Rhythm: Normal rate and regular rhythm.     Heart sounds: Normal heart sounds. No murmur. No friction rub. No gallop.   Pulmonary:     Effort: Pulmonary effort is normal. No respiratory distress.     Breath sounds: Normal breath sounds. No wheezing or rales.  Skin:    General: Skin is warm and dry.      Findings: Erythema and rash present.     Comments: Diffuse rash was noted over the right lower back with extension to the right groin.  The rash shows multiple vesicles on erythematous bases.  Neurological:     Mental Status: He is alert.     Coordination: Coordination normal.     Gait: Gait normal.  Psychiatric:        Mood and Affect: Mood normal.        Behavior: Behavior normal.        Thought Content: Thought content normal.        Judgment: Judgment normal.     Lab Review:     Component Value Date/Time   NA 140 10/04/2019 0817   NA 142 02/08/2019 1137   K 4.4 10/04/2019 0817   CL 101 10/04/2019 0817   CO2 31 10/04/2019 0817   GLUCOSE 87 10/04/2019 0817   BUN 17 10/04/2019 0817   BUN 17 02/08/2019 1137   CREATININE 0.86 10/04/2019 0817   CALCIUM 9.4 10/04/2019 0817   PROT 6.5 10/04/2019 0817   PROT 6.4 02/08/2019 1137   ALBUMIN 4.1 10/04/2019 0817   ALBUMIN 4.5 02/08/2019 1137   AST 22 10/04/2019 0817   ALT 11 10/04/2019 0817   ALKPHOS 57 10/04/2019 0817   BILITOT 0.5 10/04/2019 0817   GFRNONAA >60 10/04/2019 0817   GFRAA >60 10/04/2019 0817       Component Value Date/Time   WBC 6.5 10/04/2019 0817   WBC 14.3 (H) 04/21/2018 0515   RBC 4.73 10/04/2019 0817   HGB 14.0 10/04/2019 0817   HGB 14.6 02/08/2019 1137   HCT 41.9 10/04/2019 0817   HCT 43.3 02/08/2019 1137   PLT 283 10/04/2019 0817   PLT 349 02/08/2019 1137   MCV 88.6 10/04/2019 0817   MCV 89 02/08/2019 1137   MCH 29.6 10/04/2019 0817   MCHC 33.4 10/04/2019 0817   RDW 11.9 10/04/2019 0817   RDW 13.0 02/08/2019 1137   LYMPHSABS 1.8 10/04/2019 0817   LYMPHSABS 1.4 02/08/2019 1137   MONOABS 0.5 10/04/2019 0817   EOSABS 0.1  10/04/2019 0817   EOSABS 0.1 02/08/2019 1137   BASOSABS 0.0 10/04/2019 0817   BASOSABS 0.0 02/08/2019 1137   -------------------------------  Imaging from last 24 hours (if applicable):  Radiology interpretation: NM PET Image Initial (PI) Whole Body  Result Date:  09/20/2019 CLINICAL DATA:  Initial treatment strategy for melanoma of the left mid upper back. EXAM: NUCLEAR MEDICINE PET WHOLE BODY TECHNIQUE: 8.2 mCi F-18 FDG was injected intravenously. Full-ring PET imaging was performed from the skull vertex through the feet after the radiotracer. CT data was obtained and used for attenuation correction and anatomic localization. Fasting blood glucose: 95 mg/dl COMPARISON:  CT abdomen/pelvis from 08/06/2003 FINDINGS: Mediastinal blood pool activity: SUV max 2.2 HEAD/NECK: No significant abnormal hypermetabolic activity in this region. Incidental CT findings: Right common carotid atherosclerotic calcification. CHEST: There is a bandlike collection of over 10 small hypermetabolic subcutaneous tumors resembling a string of beads extending from the left upper back around 2 the left axilla towards several abnormally enlarged left axillary lymph nodes. The lower medial subcutaneous tumor in this vicinity extends to the cutaneous margin at measures 2.3 by 1.3 cm on image 89/4, with maximum SUV of 17.5. An index left axillary lymph node measuring 1.5 cm in short axis also on image 89/4 has a maximum SUV of 16.2. Incidental CT findings: Coronary, aortic arch, and branch vessel atherosclerotic vascular disease. Mildly elevated left hemidiaphragm with atelectasis in the left lower lobe. Linear subsegmental atelectasis or scarring medially in the right middle lobe. ABDOMEN/PELVIS: No significant abnormal hypermetabolic activity in this region. Incidental CT findings: Bilateral nonobstructive nephrolithiasis. Aortoiliac atherosclerotic vascular disease. Small bilateral indirect inguinal hernias contain adipose tissue. SKELETON: No significant abnormal hypermetabolic activity in this region. Incidental CT findings: Degenerative arthropathy in the knees, with lateral patellofemoral articular space narrowing, left greater than right. Multilevel postoperative findings in the lumbar spine.  EXTREMITIES: No significant abnormal hypermetabolic activity in this region. Incidental CT findings: Small bilateral knee joint effusions. Peripheral vascular atherosclerotic calcifications. IMPRESSION: 1. Bandlike collection of over 10 small hypermetabolic subcutaneous tumors along the left upper back extending around to the left axilla where there are several abnormally hypermetabolic left axillary lymph nodes. The pattern of distribution resembles a string of beads. Appearance compatible with active malignancy with subcutaneous tumors and axillary adenopathy. Please note that if the patient has received a COVID-19 vaccine recently in the left upper extremity, that can also cause false positive axillary adenopathy. However, in this case, the presence of the subcutaneous lesions as well as the history of a left upper back melanoma correlating to the left upper back lesions appears definitive for malignant involvement. 2. Other imaging findings of potential clinical significance: Aortic Atherosclerosis (ICD10-I70.0). Coronary atherosclerosis. Atelectasis in the left lower lobe with mildly elevated left hemidiaphragm. Bilateral nonobstructive nephrolithiasis. Small bilateral indirect inguinal hernias contain adipose tissue. Degenerative arthropathy of both knees. Electronically Signed   By: Betzaira Mentel Clines M.D.   On: 09/20/2019 16:10

## 2019-10-17 NOTE — Telephone Encounter (Signed)
No 3/26 los. No changes made to pt schedule.  

## 2019-10-19 NOTE — Progress Notes (Signed)
Pharmacist Chemotherapy Monitoring - Follow Up Assessment    I verify that I have reviewed each item in the below checklist:  . Regimen for the patient is scheduled for the appropriate day and plan matches scheduled date. Marland Kitchen Appropriate non-routine labs are ordered dependent on drug ordered. . If applicable, additional medications reviewed and ordered per protocol based on lifetime cumulative doses and/or treatment regimen.   Plan for follow-up and/or issues identified: No . I-vent associated with next due treatment: No . MD and/or nursing notified: No  Etsuko Dierolf K 10/19/2019 8:25 AM

## 2019-10-24 ENCOUNTER — Other Ambulatory Visit: Payer: Self-pay

## 2019-10-24 ENCOUNTER — Inpatient Hospital Stay: Payer: Medicare Other | Attending: Oncology

## 2019-10-24 DIAGNOSIS — Z7952 Long term (current) use of systemic steroids: Secondary | ICD-10-CM | POA: Insufficient documentation

## 2019-10-24 DIAGNOSIS — Z5112 Encounter for antineoplastic immunotherapy: Secondary | ICD-10-CM | POA: Insufficient documentation

## 2019-10-24 DIAGNOSIS — K529 Noninfective gastroenteritis and colitis, unspecified: Secondary | ICD-10-CM | POA: Diagnosis not present

## 2019-10-24 DIAGNOSIS — C4359 Malignant melanoma of other part of trunk: Secondary | ICD-10-CM | POA: Insufficient documentation

## 2019-10-24 DIAGNOSIS — E079 Disorder of thyroid, unspecified: Secondary | ICD-10-CM | POA: Insufficient documentation

## 2019-10-24 DIAGNOSIS — B029 Zoster without complications: Secondary | ICD-10-CM | POA: Diagnosis not present

## 2019-10-24 DIAGNOSIS — R590 Localized enlarged lymph nodes: Secondary | ICD-10-CM | POA: Insufficient documentation

## 2019-10-24 DIAGNOSIS — Z7982 Long term (current) use of aspirin: Secondary | ICD-10-CM | POA: Diagnosis not present

## 2019-10-24 DIAGNOSIS — Z79899 Other long term (current) drug therapy: Secondary | ICD-10-CM | POA: Insufficient documentation

## 2019-10-24 DIAGNOSIS — J189 Pneumonia, unspecified organism: Secondary | ICD-10-CM | POA: Insufficient documentation

## 2019-10-24 DIAGNOSIS — C439 Malignant melanoma of skin, unspecified: Secondary | ICD-10-CM

## 2019-10-24 LAB — CMP (CANCER CENTER ONLY)
ALT: 19 U/L (ref 0–44)
AST: 23 U/L (ref 15–41)
Albumin: 4.1 g/dL (ref 3.5–5.0)
Alkaline Phosphatase: 63 U/L (ref 38–126)
Anion gap: 8 (ref 5–15)
BUN: 20 mg/dL (ref 8–23)
CO2: 37 mmol/L — ABNORMAL HIGH (ref 22–32)
Calcium: 9.8 mg/dL (ref 8.9–10.3)
Chloride: 97 mmol/L — ABNORMAL LOW (ref 98–111)
Creatinine: 0.92 mg/dL (ref 0.61–1.24)
GFR, Est AFR Am: >60 mL/min (ref >60)
GFR, Estimated: >60 mL/min (ref >60)
Glucose, Bld: 81 mg/dL (ref 70–99)
Potassium: 5 mmol/L (ref 3.5–5.1)
Sodium: 142 mmol/L (ref 135–145)
Total Bilirubin: 0.6 mg/dL (ref 0.3–1.2)
Total Protein: 6.9 g/dL (ref 6.5–8.1)

## 2019-10-24 LAB — CBC WITH DIFFERENTIAL (CANCER CENTER ONLY)
Abs Immature Granulocytes: 0.01 10*3/uL (ref 0.00–0.07)
Basophils Absolute: 0 10*3/uL (ref 0.0–0.1)
Basophils Relative: 0 %
Eosinophils Absolute: 0.1 10*3/uL (ref 0.0–0.5)
Eosinophils Relative: 1 %
HCT: 48.5 % (ref 39.0–52.0)
Hemoglobin: 15.6 g/dL (ref 13.0–17.0)
Immature Granulocytes: 0 %
Lymphocytes Relative: 21 %
Lymphs Abs: 1.6 10*3/uL (ref 0.7–4.0)
MCH: 30 pg (ref 26.0–34.0)
MCHC: 32.2 g/dL (ref 30.0–36.0)
MCV: 93.3 fL (ref 80.0–100.0)
Monocytes Absolute: 0.6 10*3/uL (ref 0.1–1.0)
Monocytes Relative: 8 %
Neutro Abs: 5.3 10*3/uL (ref 1.7–7.7)
Neutrophils Relative %: 70 %
Platelet Count: 330 10*3/uL (ref 150–400)
RBC: 5.2 MIL/uL (ref 4.22–5.81)
RDW: 12.6 % (ref 11.5–15.5)
WBC Count: 7.5 10*3/uL (ref 4.0–10.5)
nRBC: 0 % (ref 0.0–0.2)

## 2019-10-24 LAB — TSH: TSH: 2.23 u[IU]/mL (ref 0.320–4.118)

## 2019-10-24 LAB — LACTATE DEHYDROGENASE: LDH: 177 U/L (ref 98–192)

## 2019-10-25 ENCOUNTER — Inpatient Hospital Stay: Payer: Medicare Other | Admitting: Oncology

## 2019-10-25 ENCOUNTER — Inpatient Hospital Stay: Payer: Medicare Other

## 2019-10-25 ENCOUNTER — Other Ambulatory Visit: Payer: Self-pay

## 2019-10-25 VITALS — BP 133/78 | HR 77 | Temp 98.3°F | Resp 20 | Ht 64.0 in | Wt 145.8 lb

## 2019-10-25 DIAGNOSIS — C439 Malignant melanoma of skin, unspecified: Secondary | ICD-10-CM

## 2019-10-25 DIAGNOSIS — C4359 Malignant melanoma of other part of trunk: Secondary | ICD-10-CM | POA: Diagnosis not present

## 2019-10-25 MED ORDER — SODIUM CHLORIDE 0.9 % IV SOLN
Freq: Once | INTRAVENOUS | Status: AC
  Filled 2019-10-25: qty 250

## 2019-10-25 MED ORDER — SODIUM CHLORIDE 0.9 % IV SOLN
200.0000 mg | Freq: Once | INTRAVENOUS | Status: AC
  Administered 2019-10-25: 09:00:00 200 mg via INTRAVENOUS
  Filled 2019-10-25: qty 8

## 2019-10-25 NOTE — Progress Notes (Signed)
Hematology and Oncology Follow Up Visit  Jacob Conner [id number] 1941-06-23 78 y.o. 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  He developed stage IV disease with axillary lymphadenopathy and subcutaneous nodules.   Prior Therapy: He is status post wide excision in May 2020 for a stage T3b melanoma of the upper back.  He is status post biopsy of multiple back lesions diagnosed in February 2021.  Current therapy: Pembrolizumab 200 mg every 3 weeks started on October 04, 2019.  He is here for cycle 2 of therapy.  Interim History: [name] presents today for a repeat evaluation.  Since the last visit, he was diagnosed with shingles and received appropriate treatment with improvement in his rash.  He has also noted the increase in the right axillary and subcutaneous nodules.  He denies any fevers chills sweats.  He tolerated Pembrolizumab without any complaints.  His appetite is slightly down but is able to eat and maintain reasonable quality of life and performance status.    Medications: Updated on review. Current Outpatient Medications  Medication Sig Dispense Refill  . AMBULATORY NON FORMULARY MEDICATION Take 180 mg by mouth daily. Medication Name: bempedoic acid 180 mgs vs a placebo, study drug provided    . aspirin EC 81 MG tablet Take 81 mg by mouth daily.    Marland Kitchen dexamethasone (DECADRON) 1 MG tablet 2 tablets twice daily for 2 days, one tablet twice daily for 2 days, one tablet daily for 2 days. 15 tablet 0  . famciclovir (FAMVIR) 500 MG tablet Take 1 tablet (500 mg total) by mouth 3 (three) times daily. 21 tablet 0  . finasteride (PROSCAR) 5 MG tablet Take 1 tablet (5 mg total) by mouth daily. 90 tablet 3  . furosemide (LASIX) 20 MG tablet TAKE 1 TABLET BY MOUTH ONCE DAILY. 90 tablet 0  . gabapentin (NEURONTIN) 100 MG capsule Take 2 capsules (200  mg total) by mouth at bedtime for 21 days. 42 capsule 0  . hydroxypropyl methylcellulose / hypromellose (ISOPTO TEARS / GONIOVISC) 2.5 % ophthalmic solution Place 2 drops into both eyes as needed for dry eyes (IRRITATION).    Marland Kitchen methimazole (TAPAZOLE) 5 MG tablet Take 5 mg by mouth daily.     . predniSONE (DELTASONE) 5 MG tablet 6 tab x 1 day, 5 tab x 1 day, 4 tab x 1 day, 3 tab x 1 day, 2 tab x 1 day, 1 tab x 1 day, stop 21 tablet 0  . prochlorperazine (COMPAZINE) 10 MG tablet Take 1 tablet (10 mg total) by mouth every 6 (six) hours as needed for nausea or vomiting. 30 tablet 0  . traMADol (ULTRAM) 50 MG tablet Take 1 tablet (50 mg total) by mouth every 6 (six) hours as needed. 30 tablet 0  . Vitamin D, Ergocalciferol, (DRISDOL) 1.25 MG (50000 UT) CAPS capsule Take 1 capsule (50,000 Units total) by mouth once a week. 12 capsule 3   No current facility-administered medications for this visit.     Allergies:  Allergies  Allergen Reactions  . Topamax [Topiramate] Other (See Comments)    Cognitive clouding        Physical Exam:  Blood pressure 133/78, pulse 77, temperature 98.3 F (36.8 C), temperature source Temporal, resp. rate 20, height 5' 4" (1.626 m), weight 145 lb 12.8 oz (66.1 kg), SpO2 98 %.   ECOG: 1  General appearance: Alert, awake without any distress. Head: Atraumatic without abnormalities Oropharynx: Without any thrush or ulcers. Eyes: No scleral icterus. Lymph nodes: No lymphadenopathy noted in the cervical, supraclavicular, or axillary nodes Heart:regular rate and rhythm, without any murmurs or gallops.   Lung: Clear to auscultation without any rhonchi, wheezes or dullness to percussion. Abdomin: Soft, nontender without any shifting dullness or ascites. Musculoskeletal: No clubbing or cyanosis. Neurological: No motor or sensory deficits. Skin: Increased subcutaneous nodules noted on the left upper back and around the right mid axillary line. Mild  erythematous rash noted on the right flank.  No blisters or pustules.      Lab Results: Lab Results  Component Value Date   WBC 7.5 10/24/2019   HGB 15.6 10/24/2019   HCT 48.5 10/24/2019   MCV 93.3 10/24/2019   PLT 330 10/24/2019     Chemistry      Component Value Date/Time   NA 142 10/24/2019 1225   NA 142 02/08/2019 1137   K 5.0 10/24/2019 1225   CL 97 (L) 10/24/2019 1225   CO2 37 (H) 10/24/2019 1225   BUN 20 10/24/2019 1225   BUN 17 02/08/2019 1137   CREATININE 0.92 10/24/2019 1225      Component Value Date/Time   CALCIUM 9.8 10/24/2019 1225   ALKPHOS 63 10/24/2019 1225   AST 23 10/24/2019 1225   ALT 19 10/24/2019 1225   BILITOT 0.6 10/24/2019 1225          Impression and Plan:  79 year old man with:  1.    Superficial spreading melanoma of the upper back diagnosed in May 2020.  He developed stage IV disease in February 2021.  The natural course of this disease was reviewed today.  He has received 1 cycle of Pembrolizumab without any complications but enlarging subcutaneous nodules.  Treatment options were reviewed and the options including continuing Pembrolizumab, adding ipilimumab as combination immunotherapy.  His BRAF status is pending and BRAF targeted therapy would be an option if he continues to have increasing subcutaneous nodules.  He is agreeable with this plan and will proceed with [facility] today as scheduled.   2.  Immune mediated complications: No issues reported at this time.  I continue to educate him about pneumonitis, colitis and thyroid disease.  His TSH is normal.  3.  Antiemetics: No nausea or vomiting reported at this time.  Compazine is available to him.  4.  IV access: No issues reported his peripheral veins.  Port-A-Cath option will be deferred.  5.  Herpes zoster: Improved at this time without any residual postherpetic neuralgia.  6.  Follow-up: He will return in 3 weeks for repeat evaluation.  30  minutes were spent on  this encounter.  Time was dedicated to reviewing his disease status, treatment options and addressing complications related to therapy and future plan of care.    Zola Button, MD 4/6/20218:30 AM

## 2019-10-25 NOTE — Patient Instructions (Signed)
[facility] Discharge Instructions for Patients Receiving Chemotherapy  Today you received the following immunotherapy agents: Keytruda (pembrolizumab)  To help prevent nausea and vomiting after your treatment, we encourage you to take your nausea medication as needed.   If you develop nausea and vomiting that is not controlled by your nausea medication, call the clinic.   BELOW ARE SYMPTOMS THAT SHOULD BE REPORTED IMMEDIATELY:  *FEVER GREATER THAN 100.5 F  *CHILLS WITH OR WITHOUT FEVER  NAUSEA AND VOMITING THAT IS NOT CONTROLLED WITH YOUR NAUSEA MEDICATION  *UNUSUAL SHORTNESS OF BREATH  *UNUSUAL BRUISING OR BLEEDING  TENDERNESS IN MOUTH AND THROAT WITH OR WITHOUT PRESENCE OF ULCERS  *URINARY PROBLEMS  *BOWEL PROBLEMS  UNUSUAL RASH Items with * indicate a potential emergency and should be followed up as soon as possible.  Feel free to call the clinic should you have any questions or concerns. The clinic phone number is [phone].  Please show the [place] at check-in to the Emergency Department and triage nurse.    Pembrolizumab injection What is this medicine? PEMBROLIZUMAB (pem broe liz ue mab) is a monoclonal antibody. It is used to treat certain types of cancer. This medicine may be used for other purposes; ask your health care provider or pharmacist if you have questions. COMMON BRAND NAME(S): Keytruda What should I tell my health care provider before I take this medicine? They need to know if you have any of these conditions:  diabetes  immune system problems  inflammatory bowel disease  liver disease  lung or breathing disease  lupus  received or scheduled to receive an organ transplant or a stem-cell transplant that uses donor stem cells  an unusual or allergic reaction to pembrolizumab, other medicines, foods, dyes, or preservatives  pregnant or trying to get pregnant  breast-feeding How should I use this  medicine? This medicine is for infusion into a vein. It is given by a health care professional in a hospital or clinic setting. A special MedGuide will be given to you before each treatment. Be sure to read this information carefully each time. Talk to your pediatrician regarding the use of this medicine in children. While this drug may be prescribed for children as young as 6 months for selected conditions, precautions do apply. Overdosage: If you think you have taken too much of this medicine contact a poison control center or emergency room at once. NOTE: This medicine is only for you. Do not share this medicine with others. What if I miss a dose? It is important not to miss your dose. Call your doctor or health care professional if you are unable to keep an appointment. What may interact with this medicine? Interactions have not been studied. Give your health care provider a list of all the medicines, herbs, non-prescription drugs, or dietary supplements you use. Also tell them if you smoke, drink alcohol, or use illegal drugs. Some items may interact with your medicine. This list may not describe all possible interactions. Give your health care provider a list of all the medicines, herbs, non-prescription drugs, or dietary supplements you use. Also tell them if you smoke, drink alcohol, or use illegal drugs. Some items may interact with your medicine. What should I watch for while using this medicine? Your condition will be monitored carefully while you are receiving this medicine. You may need blood work done while you are taking this medicine. Do not become pregnant while taking this medicine or for 4 months after stopping  it. Women should inform their doctor if they wish to become pregnant or think they might be pregnant. There is a potential for serious side effects to an unborn child. Talk to your health care professional or pharmacist for more information. Do not breast-feed an infant while  taking this medicine or for 4 months after the last dose. What side effects may I notice from receiving this medicine? Side effects that you should report to your doctor or health care professional as soon as possible:  allergic reactions like skin rash, itching or hives, swelling of the face, lips, or tongue  bloody or black, tarry  breathing problems  changes in vision  chest pain  chills  confusion  constipation  cough  diarrhea  dizziness or feeling faint or lightheaded  fast or irregular heartbeat  fever  flushing  joint pain  low blood counts - this medicine may decrease the number of white blood cells, red blood cells and platelets. You may be at increased risk for infections and bleeding.  muscle pain  muscle weakness  pain, tingling, numbness in the hands or feet  persistent headache  redness, blistering, peeling or loosening of the skin, including inside the mouth  signs and symptoms of high blood sugar such as dizziness; dry mouth; dry skin; fruity breath; nausea; stomach pain; increased hunger or thirst; increased urination  signs and symptoms of kidney injury like trouble passing urine or change in the amount of urine  signs and symptoms of liver injury like dark urine, light-colored stools, loss of appetite, nausea, right upper belly pain, yellowing of the eyes or skin  sweating  swollen lymph nodes  weight loss Side effects that usually do not require medical attention (report to your doctor or health care professional if they continue or are bothersome):  decreased appetite  hair loss  muscle pain  tiredness This list may not describe all possible side effects. Call your doctor for medical advice about side effects. You may report side effects to FDA at 1-800-FDA-1088. Where should I keep my medicine? This drug is given in a hospital or clinic and will not be stored at home. NOTE: This sheet is a summary. It may not cover all  possible information. If you have questions about this medicine, talk to your doctor, pharmacist, or health care provider.  2020 Elsevier/Gold Standard (2019-05-13 18:07:58)

## 2019-10-26 ENCOUNTER — Telehealth: Payer: Self-pay | Admitting: Oncology

## 2019-10-26 NOTE — Telephone Encounter (Signed)
Scheduled appt per 4/6 los 

## 2019-11-03 ENCOUNTER — Telehealth: Payer: Self-pay

## 2019-11-03 ENCOUNTER — Other Ambulatory Visit: Payer: Self-pay | Admitting: Medical

## 2019-11-03 ENCOUNTER — Telehealth: Payer: Self-pay | Admitting: Emergency Medicine

## 2019-11-03 MED ORDER — PREDNISONE 10 MG PO TABS: ORAL_TABLET | ORAL | 0 refills | Status: DC

## 2019-11-03 NOTE — Telephone Encounter (Signed)
Called and spoke to patient's wife Butch Penny who is on patient's release of information form.  Informed her that Dr. Alen Blew will discuss the patient's concerns at his next visit on 11/15/19.  Wife verbalized understanding and was instructed to call office if the patient had any additional questions or concerns.

## 2019-11-03 NOTE — Telephone Encounter (Signed)
Received VM from pt's wife on triage line requesting to speak with PA Sandi Mealy.  Pt was tx for shingles recently and has finished his steroid prescription but is still having pain that is not well controlled by current pain medicine.  Wife asking if pt needs to start steroids again or be seen in [facility]/be re-evaluated.  PA Lucianne Lei made aware and contacted pt's wife regarding her questions.

## 2019-11-03 NOTE — Telephone Encounter (Signed)
-----  Message from Wyatt Portela, MD sent at 11/03/2019  2:14 PM EDT ----- Regarding: RE: Patient question Results are still pending.  I will address this concern there next visit.  Thanks ----- Message ----- From: Teodoro Spray, RN Sent: 11/03/2019   2:01 PM EDT To: Wyatt Portela, MD Subject: Patient question                               Patients wife called office asking about patient's BRAF results. She is concerned and does not feel his current treatment has mad much difference in his cutaneous nodules.   I was unable to locate Mr. Blazejewski's BRAF results but see that in your note on 10/25/19 that it is still pending.   Please advise.

## 2019-11-09 NOTE — Progress Notes (Signed)
Pharmacist Chemotherapy Monitoring - Follow Up Assessment    I verify that I have reviewed each item in the below checklist:  . Regimen for the patient is scheduled for the appropriate day and plan matches scheduled date. Marland Kitchen Appropriate non-routine labs are ordered dependent on drug ordered. . If applicable, additional medications reviewed and ordered per protocol based on lifetime cumulative doses and/or treatment regimen.   Plan for follow-up and/or issues identified: No . I-vent associated with next due treatment: No . MD and/or nursing notified: No  Romualdo Bolk [facility] 11/09/2019 11:08 AM

## 2019-11-14 ENCOUNTER — Other Ambulatory Visit: Payer: Self-pay | Admitting: Lab

## 2019-11-15 ENCOUNTER — Inpatient Hospital Stay: Payer: Medicare Other | Admitting: Oncology

## 2019-11-15 ENCOUNTER — Other Ambulatory Visit: Payer: Self-pay

## 2019-11-15 ENCOUNTER — Inpatient Hospital Stay: Payer: Medicare Other

## 2019-11-15 VITALS — BP 136/86 | HR 75 | Temp 98.5°F | Resp 20 | Ht 64.0 in | Wt 141.8 lb

## 2019-11-15 DIAGNOSIS — C439 Malignant melanoma of skin, unspecified: Secondary | ICD-10-CM | POA: Diagnosis not present

## 2019-11-15 DIAGNOSIS — C4359 Malignant melanoma of other part of trunk: Secondary | ICD-10-CM | POA: Diagnosis not present

## 2019-11-15 LAB — CBC WITH DIFFERENTIAL (CANCER CENTER ONLY)
Abs Immature Granulocytes: 0.02 10*3/uL (ref 0.00–0.07)
Basophils Absolute: 0 10*3/uL (ref 0.0–0.1)
Basophils Relative: 1 %
Eosinophils Absolute: 0.1 10*3/uL (ref 0.0–0.5)
Eosinophils Relative: 1 %
HCT: 45.1 % (ref 39.0–52.0)
Hemoglobin: 14.9 g/dL (ref 13.0–17.0)
Immature Granulocytes: 0 %
Lymphocytes Relative: 16 %
Lymphs Abs: 1.4 10*3/uL (ref 0.7–4.0)
MCH: 29.8 pg (ref 26.0–34.0)
MCHC: 33 g/dL (ref 30.0–36.0)
MCV: 90.2 fL (ref 80.0–100.0)
Monocytes Absolute: 0.8 10*3/uL (ref 0.1–1.0)
Monocytes Relative: 9 %
Neutro Abs: 6.5 10*3/uL (ref 1.7–7.7)
Neutrophils Relative %: 73 %
Platelet Count: 282 10*3/uL (ref 150–400)
RBC: 5 MIL/uL (ref 4.22–5.81)
RDW: 12.4 % (ref 11.5–15.5)
WBC Count: 8.8 10*3/uL (ref 4.0–10.5)
nRBC: 0 % (ref 0.0–0.2)

## 2019-11-15 LAB — CMP (CANCER CENTER ONLY)
ALT: 23 U/L (ref 0–44)
AST: 29 U/L (ref 15–41)
Albumin: 3.7 g/dL (ref 3.5–5.0)
Alkaline Phosphatase: 64 U/L (ref 38–126)
Anion gap: 9 (ref 5–15)
BUN: 15 mg/dL (ref 8–23)
CO2: 34 mmol/L — ABNORMAL HIGH (ref 22–32)
Calcium: 9.5 mg/dL (ref 8.9–10.3)
Chloride: 98 mmol/L (ref 98–111)
Creatinine: 0.82 mg/dL (ref 0.61–1.24)
GFR, Est AFR Am: >60 mL/min (ref >60)
GFR, Estimated: >60 mL/min (ref >60)
Glucose, Bld: 97 mg/dL (ref 70–99)
Potassium: 4.1 mmol/L (ref 3.5–5.1)
Sodium: 141 mmol/L (ref 135–145)
Total Bilirubin: 0.6 mg/dL (ref 0.3–1.2)
Total Protein: 6.3 g/dL — ABNORMAL LOW (ref 6.5–8.1)

## 2019-11-15 LAB — LACTATE DEHYDROGENASE: LDH: 217 U/L — ABNORMAL HIGH (ref 98–192)

## 2019-11-15 LAB — TSH: TSH: 2.58 u[IU]/mL (ref 0.320–4.118)

## 2019-11-15 MED ORDER — GABAPENTIN 100 MG PO CAPS: 200.0000 mg | ORAL_CAPSULE | Freq: Two times a day (BID) | ORAL | 3 refills | Status: AC

## 2019-11-15 MED ORDER — SODIUM CHLORIDE 0.9 % IV SOLN
Freq: Once | INTRAVENOUS | Status: AC
  Filled 2019-11-15: qty 250

## 2019-11-15 MED ORDER — SODIUM CHLORIDE 0.9 % IV SOLN
200.0000 mg | Freq: Once | INTRAVENOUS | Status: AC
  Administered 2019-11-15: 200 mg via INTRAVENOUS
  Filled 2019-11-15: qty 8

## 2019-11-15 NOTE — Patient Instructions (Signed)
[facility] Discharge Instructions for Patients Receiving Chemotherapy  Today you received the following chemotherapy agents:  Keytruda.  To help prevent nausea and vomiting after your treatment, we encourage you to take your nausea medication as directed.   If you develop nausea and vomiting that is not controlled by your nausea medication, call the clinic.   BELOW ARE SYMPTOMS THAT SHOULD BE REPORTED IMMEDIATELY:  *FEVER GREATER THAN 100.5 F  *CHILLS WITH OR WITHOUT FEVER  NAUSEA AND VOMITING THAT IS NOT CONTROLLED WITH YOUR NAUSEA MEDICATION  *UNUSUAL SHORTNESS OF BREATH  *UNUSUAL BRUISING OR BLEEDING  TENDERNESS IN MOUTH AND THROAT WITH OR WITHOUT PRESENCE OF ULCERS  *URINARY PROBLEMS  *BOWEL PROBLEMS  UNUSUAL RASH Items with * indicate a potential emergency and should be followed up as soon as possible.  Feel free to call the clinic should you have any questions or concerns. The clinic phone number is [phone].  Please show the CHEMO ALERT CARD at check-in to the Emergency Department and triage nurse.    

## 2019-11-15 NOTE — Progress Notes (Signed)
Hematology and Oncology Follow Up Visit  Jacob Conner [id number] 12/23/1940 79 y.o. 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     Prior Therapy: He is status post wide excision in May 2020 for a stage T3b melanoma of the upper back.  He is status post biopsy of multiple back lesions diagnosed in February 2021.  Current therapy: Pembrolizumab 200 mg every 3 weeks started on October 04, 2019.  He is here for cycle 3 of therapy.  Interim History: Jacob Conner returns today for a follow-up visit.  He reports no major changes in his health.  He has tolerated Pembrolizumab without any major complaints.  He does report some occasional sleepiness but overall performance status is adequate.  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  He denies any new skin rash or lesions.  He denies any worsening of the.    Medications: Reviewed without changes. Current Outpatient Medications  Medication Sig Dispense Refill  . AMBULATORY NON FORMULARY MEDICATION Take 180 mg by mouth daily. Medication Name: bempedoic acid 180 mgs vs a placebo, study drug provided    . aspirin EC 81 MG tablet Take 81 mg by mouth daily.    Marland Kitchen dexamethasone (DECADRON) 1 MG tablet 2 tablets twice daily for 2 days, one tablet twice daily for 2 days, one tablet daily for 2 days. 15 tablet 0  . famciclovir (FAMVIR) 500 MG tablet Take 1 tablet (500 mg total) by mouth 3 (three) times daily. 21 tablet 0  . finasteride (PROSCAR) 5 MG tablet Take 1 tablet (5 mg total) by mouth daily. 90 tablet 3  . furosemide (LASIX) 20 MG tablet TAKE 1 TABLET BY MOUTH ONCE DAILY. 90 tablet 0  . gabapentin (NEURONTIN) 100 MG capsule Take 2 capsules (200 mg total) by  mouth at bedtime for 21 days. 42 capsule 0  . hydroxypropyl methylcellulose / hypromellose (ISOPTO TEARS / GONIOVISC) 2.5 % ophthalmic solution Place 2 drops into both eyes as needed for dry eyes (IRRITATION).    Marland Kitchen methimazole (TAPAZOLE) 5 MG tablet Take 5 mg by mouth daily.     . predniSONE (DELTASONE) 10 MG tablet 6 tab x 1 day, 5 tab x 1 day, 4 tab x 1 day, 3 tab x 1 day, 2 tab x 1 day, 1 tab x 1 day, 1/2 tab x 2 days, then stop 22 tablet 0  . prochlorperazine (COMPAZINE) 10 MG tablet Take 1 tablet (10 mg total) by mouth every 6 (six) hours as needed for nausea or vomiting. 30 tablet 0  . traMADol (ULTRAM) 50 MG tablet Take 1 tablet (50 mg total) by mouth every 6 (six) hours as needed. 30 tablet 0  . Vitamin D, Ergocalciferol, (DRISDOL) 1.25 MG (50000 UT) CAPS capsule Take 1 capsule (50,000 Units total) by mouth once a week. 12 capsule 3   No current facility-administered medications for this visit.     Allergies:  Allergies  Allergen Reactions  . Topamax [Topiramate] Other (See Comments)    Cognitive clouding        Physical Exam:  Blood pressure 136/86, pulse 75, temperature 98.5 F (36.9 C), temperature source Temporal, resp. rate 20, height 5' 4"  (1.626 m), weight 141 lb 12.8 oz (64.3 kg), SpO2  100 %.   ECOG: 1   General appearance: Comfortable appearing without any discomfort Head: Normocephalic without any trauma Oropharynx: Mucous membranes are moist and pink without any thrush or ulcers. Eyes: Pupils are equal and round reactive to light. Lymph nodes: No cervical, supraclavicular, inguinal or axillary lymphadenopathy.   Heart:regular rate and rhythm.  S1 and S2 without leg edema. Lung: Clear without any rhonchi or wheezes.  No dullness to percussion. Abdomin: Soft, nontender, nondistended with good bowel sounds.  No hepatosplenomegaly. Musculoskeletal: No joint deformity or effusion.  Full range of motion noted. Neurological: No deficits noted on motor, sensory and  deep tendon reflex exam. Skin: Subcutaneous nodules noted on the left mid axillary line stable in size.       Lab Results: Lab Results  Component Value Date   WBC 7.5 10/24/2019   HGB 15.6 10/24/2019   HCT 48.5 10/24/2019   MCV 93.3 10/24/2019   PLT 330 10/24/2019     Chemistry      Component Value Date/Time   NA 142 10/24/2019 1225   NA 142 02/08/2019 1137   K 5.0 10/24/2019 1225   CL 97 (L) 10/24/2019 1225   CO2 37 (H) 10/24/2019 1225   BUN 20 10/24/2019 1225   BUN 17 02/08/2019 1137   CREATININE 0.92 10/24/2019 1225      Component Value Date/Time   CALCIUM 9.8 10/24/2019 1225   ALKPHOS 63 10/24/2019 1225   AST 23 10/24/2019 1225   ALT 19 10/24/2019 1225   BILITOT 0.6 10/24/2019 1225          Impression and Plan:  79 year old man with:  1.    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  He was initially diagnosed in May 2020 with localized disease.  He is currently on Pembrolizumab without any major complications although has not experienced any major clinical benefit.  No major progression noted at this time either.  We will continue to await the BRAF results and will continue with Pembrolizumab for the time being.  Options of therapy would include combination immunotherapy with ipilimumab and nivolumab versus BRAF targeted therapy if he harbors appropriate mutation.  The plan is to repeat staging work-up after cycle 4 of therapy.   2.  Immune mediated complications: I continue to educate him about potential complications including pneumonitis, colitis and thyroid disease..  3.  Antiemetics: Compazine is available to him without any recent nausea vomiting reported.  4.  IV access: Peripheral veins are currently managed.  5.  Herpes zoster: He is dealing with postherpetic neuralgia.  We will start Neurontin at this time to take 200 mg at nighttime and 100 mg at daytime.  6.  Follow-up: In 3  weeks for return evaluation.  30  minutes were dedicated to this visit.  The time was spent on discussing the natural course of this disease, discussing treatment options and addressing complications related to therapy.    Zola Button, MD 4/27/20218:47 AM

## 2019-11-16 ENCOUNTER — Encounter: Payer: Medicare Other | Admitting: *Deleted

## 2019-11-16 VITALS — BP 108/56 | HR 99 | Wt 138.0 lb

## 2019-11-16 DIAGNOSIS — Z006 Encounter for examination for normal comparison and control in clinical research program: Secondary | ICD-10-CM

## 2019-11-16 NOTE — Research (Signed)
Patient was seen in clinic today for his Clear Visit T10, M24. AE's to be reported to sponsor. New medications given and next appointment scheduled.

## 2019-11-22 ENCOUNTER — Encounter: Payer: Medicare Other | Admitting: *Deleted

## 2019-11-22 ENCOUNTER — Other Ambulatory Visit: Payer: Self-pay

## 2019-11-22 VITALS — BP 127/84 | HR 95 | Wt 137.4 lb

## 2019-11-22 DIAGNOSIS — Z006 Encounter for examination for normal comparison and control in clinical research program: Secondary | ICD-10-CM

## 2019-11-22 NOTE — Research (Signed)
Patient was seen in clinic today for lab re-draw. No AE's or SAE's to report.

## 2019-11-29 ENCOUNTER — Telehealth: Payer: Self-pay | Admitting: General Practice

## 2019-11-29 ENCOUNTER — Telehealth: Payer: Self-pay | Admitting: Emergency Medicine

## 2019-11-29 DIAGNOSIS — R112 Nausea with vomiting, unspecified: Secondary | ICD-10-CM

## 2019-11-29 NOTE — Telephone Encounter (Signed)
Received VM from pt's wife Jacob Conner requesting to speak with PA Sandi Mealy regarding some questions/concerns she had about Jacob Conner.  PA Lucianne Lei contacted Jacob Conner, pt scheduled to see [facility] tomorrow morning (11/30/19) for anorexia & N/V.  Lab orders in place and scheduling message sent by PA Lucianne Lei.

## 2019-11-29 NOTE — Telephone Encounter (Signed)
   Went to chart to check who called pt. [facility] number

## 2019-11-29 NOTE — Progress Notes (Signed)
[facility] Progress Note   Jacob Conner [id number] 10-24-1940 79 y.o.  Jacob Conner is managed by Dr. Zola Button  Actively treated with chemotherapy/immunotherapy/hormonal therapy: yes  Current therapy: Pembrolizumab 200 mg every 3 weeks   Last treated: 11/15/2019 (cycle #3)  Next scheduled appointment with provider: 12/06/2019  Assessment: Plan:    Melanoma of skin ([place])  Weakness - Plan: 0.9 %  sodium chloride infusion  Anorexia - Plan: mirtazapine (REMERON) 7.5 MG tablet   Metastatic melanoma: The patient continues on Pembrolizumab 200 mg every 3 weeks under the direction of Dr. Alen Blew.  He is scheduled to be seen in follow-up on 12/06/2019.  Generalized weakness: Jacob Conner was given 1 L of normal saline IV today.  Anorexia: Jacob Conner was placed on Remeron 7.5 mg p.o. nightly.  Please see After Visit Summary for patient specific instructions.  Future Appointments  Date Time Provider [place]  12/06/2019 12:30 PM [facility] ONLY [facility] None  12/06/2019  1:00 PM Wyatt Portela, MD [facility] None  12/06/2019  1:45 PM [facility] INFUSION [facility] None  05/14/2020  9:00 AM [facility] None    No orders of the defined types were placed in this encounter.      Subjective:   Patient ID:  Jacob Conner is a 79 y.o. (DOB 1940-12-20) male.  Chief Complaint:  Chief Complaint  Patient presents with  . Anorexia  . Nausea    HPI Jacob Conner  is a 79 y.o. male with a diagnosis of a metastatic melanoma who is followed by Dr. Alen Blew and is status post cycle #3 of Pembrolizumab which was dosed on 11/15/2019. His next treatment is scheduled for 12/06/2019. His wife called yesterday reporting that Jacob Conner was very weak and had little to no appetite except for breakfast when he tends to eat well. This generally is his largest if not his only meal of the day. He presents today for labs and IV  fluids.  Medications: I have reviewed the patient's current medications.  Allergies:  Allergies  Allergen Reactions  . Topamax [Topiramate] Other (See Comments)    Cognitive clouding     Past Medical History:  Diagnosis Date  . Arthritis    "pretty much all over; hands, shoulders, knees, back" (04/21/2018)  . BPH (benign prostatic hyperplasia)   . Carotid artery occlusion   . 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 Dyspnea    on exertion  . GERD (gastroesophageal reflux disease)   . Graves' disease dx'd 2015   "on daily RX" (04/21/2018)  . History of kidney stones   . Hyperlipidemia   . Internal hemorrhoids   . Melanoma ([place])    "cut off back and arm; total of 4" (04/21/2018)  . Prostate disorder   . Seasonal allergies    "hay fever in spring" (04/21/2018)  . Stroke [facility])    ?,  pt nor family  was not aware of having a stroke/frontal lobe infarct (04/21/2018)  . Thyroid disease    Hyperthyroidism  and Graves  . Tubular adenoma of colon     Past Surgical History:  Procedure Laterality Date  . ANTERIOR LAT LUMBAR FUSION Left 04/20/2018   Procedure: Lumbar two-three Lumbar three-four Lumbar four-five Anterolateral decompression with posterior percutaneous arthrodesis, MAZOR;  Surgeon: Kristeen Miss, MD;  Location: [facility];  Service: Neurosurgery;  Laterality: Left;  . APPLICATION OF  ROBOTIC ASSISTANCE FOR SPINAL PROCEDURE N/A 04/20/2018   Procedure: APPLICATION OF ROBOTIC ASSISTANCE FOR SPINAL PROCEDURE;  Surgeon: Kristeen Miss, MD;  Location: [facility];  Service: Neurosurgery;  Laterality: N/A;  . BACK SURGERY    . COLONOSCOPY  2019  . ENDARTERECTOMY Right 07/06/2017   Procedure: ENDARTERECTOMY CAROTID RIGHT;  Surgeon: Waynetta Sandy, MD;  Location: [facility];  Service: Vascular;  Laterality: Right;  . ESOPHAGOGASTRODUODENOSCOPY    . HYDROCELE EXCISION    . LITHOTRIPSY  X 2  .  LUMBAR PERCUTANEOUS PEDICLE SCREW 3 LEVEL N/A 04/20/2018   Procedure: LUMBAR PERCUTANEOUS PEDICLE SCREW THREE LEVEL;  Surgeon: Kristeen Miss, MD;  Location: [facility];  Service: Neurosurgery;  Laterality: N/A;  . MELANOMA EXCISION  X 4   "back, right arm"  . PATCH ANGIOPLASTY Right 07/06/2017   Procedure: PATCH ANGIOPLASTY RIGHT CAROTID ARTERY;  Surgeon: Waynetta Sandy, MD;  Location: [facility];  Service: Vascular;  Laterality: Right;    Family History  Problem Relation Age of Onset  . Liver disease Mother   . Colon cancer Father   . Heart disease Father   . Esophageal cancer Neg Hx   . Rectal cancer Neg Hx   . Stomach cancer Neg Hx     Social History   Socioeconomic History  . Marital status: Married    Spouse name: Butch Penny  . Number of children: Not on file  . Years of education: Not on file  . Highest education level: Not on file  Occupational History  . Not on file  Tobacco Use  . Smoking status: Never Smoker  . Smokeless tobacco: Current User    Types: Chew  . Tobacco comment: chew 1 pack per day since he was 79 years old  Substance and Sexual Activity  . Alcohol use: No  . Drug use: Never  . Sexual activity: Not on file  Other Topics Concern  . Not on file  Social History Narrative   Lives with wife   Caffeine use: 1 cup coffee per day, 1 soda per day   Right-handed   Social Determinants of Health   Financial Resource Strain:   . Difficulty of Paying Living Expenses:   Food Insecurity:   . Worried About Charity fundraiser in the Last Year:   . Arboriculturist in the Last Year:   Transportation Needs:   . Film/video editor (Medical):   Marland Kitchen Lack of Transportation (Non-Medical):   Physical Activity:   . Days of Exercise per Week:   . Minutes of Exercise per Session:   Stress:   . Feeling of Stress :   Social Connections:   . Frequency of Communication with Friends and Family:   . Frequency of Social Gatherings with Friends and Family:   . Attends  Religious Services:   . Active Member of Clubs or Organizations:   . Attends Archivist Meetings:   Marland Kitchen Marital Status:   Intimate Partner Violence:   . Fear of Current or Ex-Partner:   . Emotionally Abused:   Marland Kitchen Physically Abused:   . Sexually Abused:     Past Medical History, Surgical history, Social history, and Family history were reviewed and updated as appropriate.   Please see review of systems for further details on the patient's review from today.   Review of Systems:  Review of Systems  Constitutional: Positive for appetite change and fatigue. Negative for chills, diaphoresis and fever.  HENT: Negative for trouble swallowing.  Respiratory: Negative for cough, chest tightness and shortness of breath.   Cardiovascular: Negative for chest pain and palpitations.  Gastrointestinal: Negative for abdominal pain, constipation, diarrhea, nausea and vomiting.  Neurological: Positive for weakness. Negative for dizziness and headaches.    Objective:   Physical Exam:  BP 126/68   Pulse 73   Temp 98.7 F (37.1 C) (Temporal)   Resp 18   Ht 5\' 4"  (1.626 m)   Wt 62.3 kg (137 lb 6.4 oz)   SpO2 99%   BMI 23.58 kg/m  ECOG: 1  Physical Exam Constitutional:      General: He is not in acute distress.    Appearance: He is not diaphoretic.  HENT:     Head: Normocephalic and atraumatic.  Cardiovascular:     Rate and Rhythm: Normal rate and regular rhythm.     Heart sounds: Normal heart sounds. No murmur. No friction rub. No gallop.   Pulmonary:     Effort: Pulmonary effort is normal. No respiratory distress.     Breath sounds: Normal breath sounds. No wheezing or rales.  Skin:    General: Skin is warm and dry.     Findings: No erythema or rash.  Neurological:     Mental Status: He is alert.     Lab Review:     Component Value Date/Time   NA 142 11/30/2019 0936   NA 142 02/08/2019 1137   K 4.3 11/30/2019 0936   CL 97 (L) 11/30/2019 0936   CO2 38 (H)  11/30/2019 0936   GLUCOSE 81 11/30/2019 0936   BUN 17 11/30/2019 0936   BUN 17 02/08/2019 1137   CREATININE 0.83 11/30/2019 0936   CALCIUM 9.8 11/30/2019 0936   PROT 6.4 (L) 11/30/2019 0936   PROT 6.4 02/08/2019 1137   ALBUMIN 3.8 11/30/2019 0936   ALBUMIN 4.5 02/08/2019 1137   AST 22 11/30/2019 0936   ALT 17 11/30/2019 0936   ALKPHOS 68 11/30/2019 0936   BILITOT 0.5 11/30/2019 0936   GFRNONAA >60 11/30/2019 0936   GFRAA >60 11/30/2019 0936       Component Value Date/Time   WBC 7.4 11/30/2019 0936   WBC 14.3 (H) 04/21/2018 0515   RBC 5.14 11/30/2019 0936   HGB 15.3 11/30/2019 0936   HGB 14.6 02/08/2019 1137   HCT 47.4 11/30/2019 0936   HCT 43.3 02/08/2019 1137   PLT 286 11/30/2019 0936   PLT 349 02/08/2019 1137   MCV 92.2 11/30/2019 0936   MCV 89 02/08/2019 1137   MCH 29.8 11/30/2019 0936   MCHC 32.3 11/30/2019 0936   RDW 12.4 11/30/2019 0936   RDW 13.0 02/08/2019 1137   LYMPHSABS 1.2 11/30/2019 0936   LYMPHSABS 1.4 02/08/2019 1137   MONOABS 0.6 11/30/2019 0936   EOSABS 0.1 11/30/2019 0936   EOSABS 0.1 02/08/2019 1137   BASOSABS 0.0 11/30/2019 0936   BASOSABS 0.0 02/08/2019 1137   -------------------------------  Imaging from last 24 hours (if applicable):  Radiology interpretation: No results found.

## 2019-11-30 ENCOUNTER — Inpatient Hospital Stay: Payer: Medicare Other

## 2019-11-30 ENCOUNTER — Other Ambulatory Visit: Payer: Self-pay

## 2019-11-30 ENCOUNTER — Inpatient Hospital Stay: Payer: Medicare Other | Attending: Oncology | Admitting: Medical

## 2019-11-30 VITALS — BP 126/68 | HR 73 | Temp 98.7°F | Resp 18 | Ht 64.0 in | Wt 137.4 lb

## 2019-11-30 DIAGNOSIS — R634 Abnormal weight loss: Secondary | ICD-10-CM | POA: Diagnosis not present

## 2019-11-30 DIAGNOSIS — R531 Weakness: Secondary | ICD-10-CM | POA: Insufficient documentation

## 2019-11-30 DIAGNOSIS — M1990 Unspecified osteoarthritis, unspecified site: Secondary | ICD-10-CM | POA: Diagnosis not present

## 2019-11-30 DIAGNOSIS — E785 Hyperlipidemia, unspecified: Secondary | ICD-10-CM | POA: Diagnosis not present

## 2019-11-30 DIAGNOSIS — C4359 Malignant melanoma of other part of trunk: Secondary | ICD-10-CM | POA: Diagnosis not present

## 2019-11-30 DIAGNOSIS — K219 Gastro-esophageal reflux disease without esophagitis: Secondary | ICD-10-CM | POA: Diagnosis not present

## 2019-11-30 DIAGNOSIS — R110 Nausea: Secondary | ICD-10-CM | POA: Insufficient documentation

## 2019-11-30 DIAGNOSIS — Z7982 Long term (current) use of aspirin: Secondary | ICD-10-CM | POA: Diagnosis not present

## 2019-11-30 DIAGNOSIS — R5383 Other fatigue: Secondary | ICD-10-CM | POA: Diagnosis not present

## 2019-11-30 DIAGNOSIS — Z5112 Encounter for antineoplastic immunotherapy: Secondary | ICD-10-CM | POA: Diagnosis not present

## 2019-11-30 DIAGNOSIS — Z7952 Long term (current) use of systemic steroids: Secondary | ICD-10-CM | POA: Insufficient documentation

## 2019-11-30 DIAGNOSIS — R112 Nausea with vomiting, unspecified: Secondary | ICD-10-CM

## 2019-11-30 DIAGNOSIS — N400 Enlarged prostate without lower urinary tract symptoms: Secondary | ICD-10-CM | POA: Diagnosis not present

## 2019-11-30 DIAGNOSIS — R630 Anorexia: Secondary | ICD-10-CM | POA: Insufficient documentation

## 2019-11-30 DIAGNOSIS — Z79899 Other long term (current) drug therapy: Secondary | ICD-10-CM | POA: Diagnosis not present

## 2019-11-30 DIAGNOSIS — C439 Malignant melanoma of skin, unspecified: Secondary | ICD-10-CM | POA: Diagnosis not present

## 2019-11-30 DIAGNOSIS — Z8673 Personal history of transient ischemic attack (TIA), and cerebral infarction without residual deficits: Secondary | ICD-10-CM | POA: Diagnosis not present

## 2019-11-30 LAB — CBC WITH DIFFERENTIAL (CANCER CENTER ONLY)
Abs Immature Granulocytes: 0.02 10*3/uL (ref 0.00–0.07)
Basophils Absolute: 0 10*3/uL (ref 0.0–0.1)
Basophils Relative: 0 %
Eosinophils Absolute: 0.1 10*3/uL (ref 0.0–0.5)
Eosinophils Relative: 1 %
HCT: 47.4 % (ref 39.0–52.0)
Hemoglobin: 15.3 g/dL (ref 13.0–17.0)
Immature Granulocytes: 0 %
Lymphocytes Relative: 16 %
Lymphs Abs: 1.2 10*3/uL (ref 0.7–4.0)
MCH: 29.8 pg (ref 26.0–34.0)
MCHC: 32.3 g/dL (ref 30.0–36.0)
MCV: 92.2 fL (ref 80.0–100.0)
Monocytes Absolute: 0.6 10*3/uL (ref 0.1–1.0)
Monocytes Relative: 8 %
Neutro Abs: 5.5 10*3/uL (ref 1.7–7.7)
Neutrophils Relative %: 75 %
Platelet Count: 286 10*3/uL (ref 150–400)
RBC: 5.14 MIL/uL (ref 4.22–5.81)
RDW: 12.4 % (ref 11.5–15.5)
WBC Count: 7.4 10*3/uL (ref 4.0–10.5)
nRBC: 0 % (ref 0.0–0.2)

## 2019-11-30 LAB — CMP (CANCER CENTER ONLY)
ALT: 17 U/L (ref 0–44)
AST: 22 U/L (ref 15–41)
Albumin: 3.8 g/dL (ref 3.5–5.0)
Alkaline Phosphatase: 68 U/L (ref 38–126)
Anion gap: 7 (ref 5–15)
BUN: 17 mg/dL (ref 8–23)
CO2: 38 mmol/L — ABNORMAL HIGH (ref 22–32)
Calcium: 9.8 mg/dL (ref 8.9–10.3)
Chloride: 97 mmol/L — ABNORMAL LOW (ref 98–111)
Creatinine: 0.83 mg/dL (ref 0.61–1.24)
GFR, Est AFR Am: >60 mL/min (ref >60)
GFR, Estimated: >60 mL/min (ref >60)
Glucose, Bld: 81 mg/dL (ref 70–99)
Potassium: 4.3 mmol/L (ref 3.5–5.1)
Sodium: 142 mmol/L (ref 135–145)
Total Bilirubin: 0.5 mg/dL (ref 0.3–1.2)
Total Protein: 6.4 g/dL — ABNORMAL LOW (ref 6.5–8.1)

## 2019-11-30 LAB — MAGNESIUM: Magnesium: 1.7 mg/dL (ref 1.7–2.4)

## 2019-11-30 MED ORDER — SODIUM CHLORIDE 0.9 % IV SOLN
Freq: Once | INTRAVENOUS | Status: AC
  Filled 2019-11-30: qty 250

## 2019-11-30 MED ORDER — MIRTAZAPINE 7.5 MG PO TABS: 7.5000 mg | ORAL_TABLET | Freq: Every day | ORAL | 2 refills | Status: AC

## 2019-11-30 NOTE — Patient Instructions (Signed)

## 2019-12-06 ENCOUNTER — Inpatient Hospital Stay: Payer: Medicare Other

## 2019-12-06 ENCOUNTER — Inpatient Hospital Stay: Payer: Medicare Other | Admitting: Oncology

## 2019-12-06 ENCOUNTER — Other Ambulatory Visit: Payer: Self-pay

## 2019-12-06 ENCOUNTER — Telehealth: Payer: Self-pay | Admitting: Oncology

## 2019-12-06 VITALS — BP 96/64 | HR 84 | Temp 98.6°F | Resp 18 | Ht 64.0 in | Wt 135.1 lb

## 2019-12-06 DIAGNOSIS — C439 Malignant melanoma of skin, unspecified: Secondary | ICD-10-CM

## 2019-12-06 DIAGNOSIS — C4359 Malignant melanoma of other part of trunk: Secondary | ICD-10-CM | POA: Diagnosis not present

## 2019-12-06 LAB — CBC WITH DIFFERENTIAL (CANCER CENTER ONLY)
Abs Immature Granulocytes: 0.02 10*3/uL (ref 0.00–0.07)
Basophils Absolute: 0 10*3/uL (ref 0.0–0.1)
Basophils Relative: 0 %
Eosinophils Absolute: 0 10*3/uL (ref 0.0–0.5)
Eosinophils Relative: 0 %
HCT: 50.3 % (ref 39.0–52.0)
Hemoglobin: 16.2 g/dL (ref 13.0–17.0)
Immature Granulocytes: 0 %
Lymphocytes Relative: 16 %
Lymphs Abs: 1.3 10*3/uL (ref 0.7–4.0)
MCH: 29.9 pg (ref 26.0–34.0)
MCHC: 32.2 g/dL (ref 30.0–36.0)
MCV: 92.8 fL (ref 80.0–100.0)
Monocytes Absolute: 0.7 10*3/uL (ref 0.1–1.0)
Monocytes Relative: 9 %
Neutro Abs: 5.8 10*3/uL (ref 1.7–7.7)
Neutrophils Relative %: 75 %
Platelet Count: 330 10*3/uL (ref 150–400)
RBC: 5.42 MIL/uL (ref 4.22–5.81)
RDW: 12.8 % (ref 11.5–15.5)
WBC Count: 7.8 10*3/uL (ref 4.0–10.5)
nRBC: 0 % (ref 0.0–0.2)

## 2019-12-06 LAB — CMP (CANCER CENTER ONLY)
ALT: 14 U/L (ref 0–44)
AST: 20 U/L (ref 15–41)
Albumin: 4 g/dL (ref 3.5–5.0)
Alkaline Phosphatase: 67 U/L (ref 38–126)
Anion gap: 10 (ref 5–15)
BUN: 16 mg/dL (ref 8–23)
CO2: 40 mmol/L — ABNORMAL HIGH (ref 22–32)
Calcium: 10.2 mg/dL (ref 8.9–10.3)
Chloride: 92 mmol/L — ABNORMAL LOW (ref 98–111)
Creatinine: 0.86 mg/dL (ref 0.61–1.24)
GFR, Est AFR Am: >60 mL/min (ref >60)
GFR, Estimated: >60 mL/min (ref >60)
Glucose, Bld: 98 mg/dL (ref 70–99)
Potassium: 4.6 mmol/L (ref 3.5–5.1)
Sodium: 142 mmol/L (ref 135–145)
Total Bilirubin: 0.4 mg/dL (ref 0.3–1.2)
Total Protein: 6.7 g/dL (ref 6.5–8.1)

## 2019-12-06 LAB — TSH: TSH: 1.351 u[IU]/mL (ref 0.320–4.118)

## 2019-12-06 LAB — LACTATE DEHYDROGENASE: LDH: 144 U/L (ref 98–192)

## 2019-12-06 MED ORDER — SODIUM CHLORIDE 0.9 % IV SOLN
INTRAVENOUS | Status: DC
  Filled 2019-12-06 (×2): qty 250

## 2019-12-06 MED ORDER — SODIUM CHLORIDE 0.9 % IV SOLN
Freq: Once | INTRAVENOUS | Status: DC
  Filled 2019-12-06: qty 250

## 2019-12-06 NOTE — Progress Notes (Signed)
Hematology and Oncology Follow Up Visit  Jacob Conner [id number] 1941/04/28 79 y.o. 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  He developed stage IV disease with axillary lymph node involvement and subcutaneous nodules documented in February 2021.  This tumor is Jacob Conner wild-type.   Prior Therapy: He is status post wide excision in May 2020 for a stage T3b melanoma of the upper back.  He is status post biopsy of multiple back lesions diagnosed in February 2021.  Current therapy: Pembrolizumab 200 mg every 3 weeks started on October 04, 2019.  He is status post 3 cycles of therapy.  Interim History: Jacob Conner presents today for a follow-up.  Since the last visit, he continues to have overall health decline and increased fatigue and tiredness.  He has noted decrease in his intake and some weight loss but did not report any specific complications related to Jacob Conner.  Denies any nausea, vomiting or abdominal pain.  He is cutaneous nodules continue to grow.  He denies any pain in his axilla or back at this time.  He does ambulate short distances.    Medications: Reviewed without changes. Current Outpatient Medications  Medication Sig Dispense Refill  . AMBULATORY NON FORMULARY MEDICATION Take 180 mg by mouth daily. Medication Name: bempedoic acid 180 mgs vs a placebo, study drug provided    . aspirin EC 81 MG tablet Take 81 mg by mouth daily.    Marland Kitchen dexamethasone (DECADRON) 1 MG tablet 2 tablets twice daily for 2 days, one tablet twice daily for 2 days, one tablet daily for 2 days. 15 tablet 0  . finasteride (PROSCAR) 5 MG tablet Take 1 tablet (5 mg total) by mouth daily. 90 tablet 3  . furosemide (LASIX) 20 MG tablet TAKE 1 TABLET BY MOUTH ONCE DAILY. 90 tablet 0  . gabapentin (NEURONTIN) 100 MG capsule Take 2 capsules (200 mg total) by mouth 2 (two) times daily. 90 capsule 3   . hydroxypropyl methylcellulose / hypromellose (ISOPTO TEARS / GONIOVISC) 2.5 % ophthalmic solution Place 2 drops into both eyes as needed for dry eyes (IRRITATION).    Marland Kitchen methimazole (TAPAZOLE) 5 MG tablet Take 5 mg by mouth daily.     . mirtazapine (REMERON) 7.5 MG tablet Take 1 tablet (7.5 mg total) by mouth at bedtime. 30 tablet 2  . predniSONE (STERAPRED UNI-PAK 21 TAB) 10 MG (21) TBPK tablet     . prochlorperazine (COMPAZINE) 10 MG tablet Take 1 tablet (10 mg total) by mouth every 6 (six) hours as needed for nausea or vomiting. 30 tablet 0  . traMADol (ULTRAM) 50 MG tablet Take 1 tablet (50 mg total) by mouth every 6 (six) hours as needed. 30 tablet 0  . Vitamin D, Ergocalciferol, (DRISDOL) 1.25 MG (50000 UT) CAPS capsule Take 1 capsule (50,000 Units total) by mouth once a week. 12 capsule 3   No current facility-administered medications for this visit.     Allergies:  Allergies  Allergen Reactions  . Topamax [Topiramate] Other (See Comments)    Cognitive clouding        Physical Exam:  Blood pressure 96/64, pulse 84, temperature 98.6 F (37 C), temperature source Temporal, resp. rate 18, height 5' 4"  (1.626 m), weight 135 lb 1.6 oz (61.3 kg), SpO2 100 %.   ECOG: 1    General appearance: Alert, awake without any distress. Head: Atraumatic without abnormalities Oropharynx:  Without any thrush or ulcers. Eyes: No scleral icterus. Lymph nodes: No lymphadenopathy noted in the cervical, supraclavicular, or axillary nodes Heart:regular rate and rhythm, without any murmurs or gallops.   Lung: Clear to auscultation without any rhonchi, wheezes or dullness to percussion. Abdomin: Soft, nontender without any shifting dullness or ascites. Musculoskeletal: No clubbing or cyanosis. Neurological: No motor or sensory deficits. Skin: Increase in the size of left-sided mid scapular nodules.  Axillary lymphadenopathy palpated on the left.        Lab Results: Lab Results   Component Value Date   WBC 7.8 12/06/2019   HGB 16.2 12/06/2019   HCT 50.3 12/06/2019   MCV 92.8 12/06/2019   PLT 330 12/06/2019     Chemistry      Component Value Date/Time   NA 142 11/30/2019 0936   NA 142 02/08/2019 1137   K 4.3 11/30/2019 0936   CL 97 (L) 11/30/2019 0936   CO2 38 (H) 11/30/2019 0936   BUN 17 11/30/2019 0936   BUN 17 02/08/2019 1137   CREATININE 0.83 11/30/2019 0936      Component Value Date/Time   CALCIUM 9.8 11/30/2019 0936   ALKPHOS 68 11/30/2019 0936   AST 22 11/30/2019 0936   ALT 17 11/30/2019 0936   BILITOT 0.5 11/30/2019 0936          Impression and Plan:  79 year old man with:  1.    Cutaneous melanoma of the upper back diagnosed in 2020.  He developed stage IV disease in 2021.  The natural course of this disease was updated at this time and appears to be progressing or at least not improving with Pembrolizumab.  His tumor is Jacob Conner wild-type and would not benefit for a Jacob Conner targeted therapy.  Treatment options were reviewed today with the patient and his wife.  His options would include continuing Pembrolizumab, switch to a combination immunotherapy with ipilimumab and nivolumab, chemotherapy utilizing dacarbazine or supportive care only.  After discussion today, we opted to proceed with salvage combination immunotherapy utilizing ipilimumab and nivolumab.  Given his age and comorbidities, he will receive ipilimumab at 1 mg/ kg and nivolumab at 3 mg/kg every 3 weeks.  Immune mediated complications were reiterated at this time.  Immune complications include pneumonitis, hepatitis, thyroid disease among others.  We will update his staging work-up with PET scan next week.   2.  Immune mediated complications: He is not experiencing any at this time but the risk would increase with combination immunotherapy.  3.  Antiemetics: No nausea or vomiting reported at this time.  Compazine is available for him.  4.  IV access: Peripheral veins  continues to be in use at this time.  5.  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  6.  Hypotension for p.o. intake: He will give him IV hydration today and encouraged him to take Remeron prescribed to him.  7.  Follow-up: In the next few weeks to start therapy  30  minutes were spent on this encounter.  The time was updating his disease status, discussing treatment options and complications with    Zola Button, MD 5/18/202112:56 PM

## 2019-12-06 NOTE — Patient Instructions (Signed)

## 2019-12-06 NOTE — Progress Notes (Signed)
DISCONTINUE ON PATHWAY REGIMEN - Melanoma and Other Skin Cancers     A cycle is 21 days:     Pembrolizumab   **Always confirm dose/schedule in your pharmacy ordering system**  REASON: Disease Progression PRIOR TREATMENT: [id number]: Pembrolizumab 200 mg q21 Days Until Progression or Unacceptable Toxicity TREATMENT RESPONSE: Progressive Disease (PD)  START ON PATHWAY REGIMEN - Melanoma and Other Skin Cancers     A cycle is every 21 days:     Nivolumab      Ipilimumab    A cycle is every 14 days:     Nivolumab   **Always confirm dose/schedule in your pharmacy ordering system**  Patient Characteristics: Melanoma, Cutaneous/Unknown Primary, Distant Metastases or Unresectable Local Recurrence, Unresectable, Symptomatic, Second Line, BRAF V600 Wild Type / BRAF V600 Results Pending or Unknown, Candidate for Immunotherapy Disease Classification: Melanoma Disease Subtype: Cutaneous BRAF V600 Mutation Status: BRAF V600 Wild Type (No Mutation) Therapeutic Status: Distant Metastases Metastatic Disease Type: Symptomatic Line of Therapy: Second Line Immunotherapy Candidate Status: Candidate for Immunotherapy Intent of Therapy: Non-Curative / Palliative Intent, Discussed with Patient

## 2019-12-06 NOTE — Telephone Encounter (Signed)
Scheduled appt per 5/18 los.  Spoke with pt and he is aware of his appt date and time.

## 2019-12-08 ENCOUNTER — Telehealth: Payer: Self-pay | Admitting: *Deleted

## 2019-12-08 NOTE — Telephone Encounter (Signed)
Patient called today to discuss continuing or not to continue CLEAR study drug. He said he is now having to crush his medications to take them. He has decided that he would like to continue to stay in the study but not continue taking the study drug at this point. Patient will remain in the study to have labs drawn but will no longer receive study medication.

## 2019-12-12 NOTE — Progress Notes (Signed)
Pharmacist Chemotherapy Monitoring - Initial Assessment    Anticipated start date: 12/16/2019   Regimen:  . Are orders appropriate based on the patient's diagnosis, regimen, and cycle? Yes . Does the plan date match the patient's scheduled date? Yes . Is the sequencing of drugs appropriate? Yes . Are the premedications appropriate for the patient's regimen? Yes . Prior Authorization for treatment is: Approved o If applicable, is the correct biosimilar selected based on the patient's insurance? not applicable  Organ Function and Labs: Marland Kitchen Are dose adjustments needed based on the patient's renal function, hepatic function, or hematologic function? Yes . Are appropriate labs ordered prior to the start of patient's treatment? Yes . Other organ system assessment, if indicated: N/A . The following baseline labs, if indicated, have been ordered: ipilimumab: baseline TSH +/- T4 and nivolumab: baseline TSH +/- T4  Dose Assessment: . Are the drug doses appropriate? Yes . Are the following correct: o Drug concentrations Yes o IV fluid compatible with drug Yes o Administration routes Yes o Timing of therapy Yes . If applicable, does the patient have documented access for treatment and/or plans for port-a-cath placement? no . If applicable, have lifetime cumulative doses been properly documented and assessed? no Lifetime Dose Tracking  No doses have been documented on this patient for the following tracked chemicals: Doxorubicin, Epirubicin, Idarubicin, Daunorubicin, Mitoxantrone, Bleomycin, Oxaliplatin, Carboplatin, Liposomal Doxorubicin  o   Toxicity Monitoring/Prevention: . The patient has the following take home antiemetics prescribed: Prochlorperazine . The patient has the following take home medications prescribed: N/A . Medication allergies and previous infusion related reactions, if applicable, have been reviewed and addressed. No . The patient's current medication list has been assessed  for drug-drug interactions with their chemotherapy regimen. no significant drug-drug interactions were identified on review.  Order Review: . Are the treatment plan orders signed? Yes . Is the patient scheduled to see a provider prior to their treatment? No  I verify that I have reviewed each item in the above checklist and answered each question accordingly.  Chakira Jachim D 12/12/2019 3:51 PM

## 2019-12-16 ENCOUNTER — Inpatient Hospital Stay: Payer: Medicare Other

## 2019-12-16 ENCOUNTER — Other Ambulatory Visit: Payer: Self-pay

## 2019-12-16 VITALS — BP 121/75 | HR 87 | Temp 98.4°F | Resp 20

## 2019-12-16 DIAGNOSIS — C439 Malignant melanoma of skin, unspecified: Secondary | ICD-10-CM

## 2019-12-16 DIAGNOSIS — C4359 Malignant melanoma of other part of trunk: Secondary | ICD-10-CM | POA: Diagnosis not present

## 2019-12-16 LAB — CBC WITH DIFFERENTIAL (CANCER CENTER ONLY)
Abs Immature Granulocytes: 0.02 10*3/uL (ref 0.00–0.07)
Basophils Absolute: 0 10*3/uL (ref 0.0–0.1)
Basophils Relative: 0 %
Eosinophils Absolute: 0 10*3/uL (ref 0.0–0.5)
Eosinophils Relative: 0 %
HCT: 46.7 % (ref 39.0–52.0)
Hemoglobin: 14.9 g/dL (ref 13.0–17.0)
Immature Granulocytes: 0 %
Lymphocytes Relative: 12 %
Lymphs Abs: 1 10*3/uL (ref 0.7–4.0)
MCH: 30 pg (ref 26.0–34.0)
MCHC: 31.9 g/dL (ref 30.0–36.0)
MCV: 94.2 fL (ref 80.0–100.0)
Monocytes Absolute: 0.6 10*3/uL (ref 0.1–1.0)
Monocytes Relative: 7 %
Neutro Abs: 6.2 10*3/uL (ref 1.7–7.7)
Neutrophils Relative %: 81 %
Platelet Count: 292 10*3/uL (ref 150–400)
RBC: 4.96 MIL/uL (ref 4.22–5.81)
RDW: 12.9 % (ref 11.5–15.5)
WBC Count: 7.8 10*3/uL (ref 4.0–10.5)
nRBC: 0 % (ref 0.0–0.2)

## 2019-12-16 LAB — CMP (CANCER CENTER ONLY)
ALT: 16 U/L (ref 0–44)
AST: 19 U/L (ref 15–41)
Albumin: 3.7 g/dL (ref 3.5–5.0)
Alkaline Phosphatase: 77 U/L (ref 38–126)
Anion gap: 11 (ref 5–15)
BUN: 12 mg/dL (ref 8–23)
CO2: 37 mmol/L — ABNORMAL HIGH (ref 22–32)
Calcium: 9.9 mg/dL (ref 8.9–10.3)
Chloride: 96 mmol/L — ABNORMAL LOW (ref 98–111)
Creatinine: 0.76 mg/dL (ref 0.61–1.24)
GFR, Est AFR Am: >60 mL/min (ref >60)
GFR, Estimated: >60 mL/min (ref >60)
Glucose, Bld: 120 mg/dL — ABNORMAL HIGH (ref 70–99)
Potassium: 4.3 mmol/L (ref 3.5–5.1)
Sodium: 144 mmol/L (ref 135–145)
Total Bilirubin: 0.4 mg/dL (ref 0.3–1.2)
Total Protein: 6.1 g/dL — ABNORMAL LOW (ref 6.5–8.1)

## 2019-12-16 LAB — LACTATE DEHYDROGENASE: LDH: 155 U/L (ref 98–192)

## 2019-12-16 LAB — TSH: TSH: 1.572 u[IU]/mL (ref 0.320–4.118)

## 2019-12-16 MED ORDER — FAMOTIDINE IN NACL 20-0.9 MG/50ML-% IV SOLN
20.0000 mg | Freq: Once | INTRAVENOUS | Status: AC
  Administered 2019-12-16: 20 mg via INTRAVENOUS

## 2019-12-16 MED ORDER — SODIUM CHLORIDE 0.9 % IV SOLN
Freq: Once | INTRAVENOUS | Status: AC
  Filled 2019-12-16: qty 250

## 2019-12-16 MED ORDER — DIPHENHYDRAMINE HCL 50 MG/ML IJ SOLN
INTRAMUSCULAR | Status: AC
  Filled 2019-12-16: qty 1

## 2019-12-16 MED ORDER — FAMOTIDINE IN NACL 20-0.9 MG/50ML-% IV SOLN
INTRAVENOUS | Status: AC
  Filled 2019-12-16: qty 50

## 2019-12-16 MED ORDER — SODIUM CHLORIDE 0.9 % IV SOLN
1.0000 mg/kg | Freq: Once | INTRAVENOUS | Status: AC
  Administered 2019-12-16: 60 mg via INTRAVENOUS
  Filled 2019-12-16: qty 12

## 2019-12-16 MED ORDER — SODIUM CHLORIDE 0.9 % IV SOLN
3.2600 mg/kg | Freq: Once | INTRAVENOUS | Status: AC
  Administered 2019-12-16: 200 mg via INTRAVENOUS
  Filled 2019-12-16: qty 20

## 2019-12-16 MED ORDER — DIPHENHYDRAMINE HCL 50 MG/ML IJ SOLN
25.0000 mg | Freq: Once | INTRAMUSCULAR | Status: AC
  Administered 2019-12-16: 25 mg via INTRAVENOUS

## 2019-12-16 NOTE — Progress Notes (Signed)
[place] check list completed. Pt reports "yes" to having difficulty completing normal activities, feeling sluggish, double vision when first waking up in the morning, Pt reports watery stools every other day, and weakness in his arms. Dr. Alen Blew notified and ok to treat today.

## 2019-12-16 NOTE — Patient Instructions (Signed)
[facility] Discharge Instructions for Patients Receiving Chemotherapy  Today you received the following immunotherapy agents Nivolumab (Opdivo) and Ipilimumab Curt Bears)   To help prevent nausea and vomiting after your treatment, we encourage you to take your nausea medication as directed   If you develop nausea and vomiting that is not controlled by your nausea medication, call the clinic.   BELOW ARE SYMPTOMS THAT SHOULD BE REPORTED IMMEDIATELY:  *FEVER GREATER THAN 100.5 F  *CHILLS WITH OR WITHOUT FEVER  NAUSEA AND VOMITING THAT IS NOT CONTROLLED WITH YOUR NAUSEA MEDICATION  *UNUSUAL SHORTNESS OF BREATH  *UNUSUAL BRUISING OR BLEEDING  TENDERNESS IN MOUTH AND THROAT WITH OR WITHOUT PRESENCE OF ULCERS  *URINARY PROBLEMS  *BOWEL PROBLEMS  UNUSUAL RASH Items with * indicate a potential emergency and should be followed up as soon as possible.  Feel free to call the clinic should you have any questions or concerns. The clinic phone number is [phone].  Please show the [facility] at check-in to the Emergency Department and triage nurse.  Nivolumab (Opdivo) injection What is this medicine? NIVOLUMAB (nye VOL ue mab) is a monoclonal antibody. It is used to treat colon cancer, esophageal cancer, head and neck cancer, Hodgkin lymphoma, kidney cancer, liver cancer, lung cancer, mesothelioma, melanoma, and urothelial cancer. This medicine may be used for other purposes; ask your health care provider or pharmacist if you have questions. COMMON BRAND NAME(S): Opdivo What should I tell my health care provider before I take this medicine? They need to know if you have any of these conditions:  diabetes  immune system problems  kidney disease  liver disease  lung disease  organ transplant  stomach or intestine problems  thyroid disease  an unusual or allergic reaction to nivolumab, other medicines, foods, dyes, or preservatives  pregnant or trying  to get pregnant  breast-feeding How should I use this medicine? This medicine is for infusion into a vein. It is given by a health care professional in a hospital or clinic setting. A special MedGuide will be given to you before each treatment. Be sure to read this information carefully each time. Talk to your pediatrician regarding the use of this medicine in children. While this drug may be prescribed for children as young as 12 years for selected conditions, precautions do apply. Overdosage: If you think you have taken too much of this medicine contact a poison control center or emergency room at once. NOTE: This medicine is only for you. Do not share this medicine with others. What if I miss a dose? It is important not to miss your dose. Call your doctor or health care professional if you are unable to keep an appointment. What may interact with this medicine? Interactions have not been studied. Give your health care provider a list of all the medicines, herbs, non-prescription drugs, or dietary supplements you use. Also tell them if you smoke, drink alcohol, or use illegal drugs. Some items may interact with your medicine. This list may not describe all possible interactions. Give your health care provider a list of all the medicines, herbs, non-prescription drugs, or dietary supplements you use. Also tell them if you smoke, drink alcohol, or use illegal drugs. Some items may interact with your medicine. What should I watch for while using this medicine? This drug may make you feel generally unwell. Continue your course of treatment even though you feel ill unless your doctor tells you to stop. You may need blood work done  while you are taking this medicine. Do not become pregnant while taking this medicine or for 5 months after stopping it. Women should inform their doctor if they wish to become pregnant or think they might be pregnant. There is a potential for serious side effects to an  unborn child. Talk to your health care professional or pharmacist for more information. Do not breast-feed an infant while taking this medicine or for 5 months after stopping it. What side effects may I notice from receiving this medicine? Side effects that you should report to your doctor or health care professional as soon as possible:  allergic reactions like skin rash, itching or hives, swelling of the face, lips, or tongue  breathing problems  blood in the urine  bloody or watery diarrhea or black, tarry stools  changes in emotions or moods  changes in vision  chest pain  cough  dizziness  feeling faint or lightheaded, falls  fever, chills  headache with fever, neck stiffness, confusion, loss of memory, sensitivity to light, hallucination, loss of contact with reality, or seizures  joint pain  mouth sores  redness, blistering, peeling or loosening of the skin, including inside the mouth  severe muscle pain or weakness  signs and symptoms of high blood sugar such as dizziness; dry mouth; dry skin; fruity breath; nausea; stomach pain; increased hunger or thirst; increased urination  signs and symptoms of kidney injury like trouble passing urine or change in the amount of urine  signs and symptoms of liver injury like dark yellow or brown urine; general ill feeling or flu-like symptoms; light-colored stools; loss of appetite; nausea; right upper belly pain; unusually weak or tired; yellowing of the eyes or skin  swelling of the ankles, feet, hands  trouble passing urine or change in the amount of urine  unusually weak or tired  weight gain or loss Side effects that usually do not require medical attention (report to your doctor or health care professional if they continue or are bothersome):  bone pain  constipation  decreased appetite  diarrhea  muscle pain  nausea, vomiting  tiredness This list may not describe all possible side effects. Call your  doctor for medical advice about side effects. You may report side effects to FDA at 1-800-FDA-1088. Where should I keep my medicine? This drug is given in a hospital or clinic and will not be stored at home. NOTE: This sheet is a summary. It may not cover all possible information. If you have questions about this medicine, talk to your doctor, pharmacist, or health care provider.  2020 Elsevier/Gold Standard (2019-04-26 10:04:50)  Ipilimumab Curt Bears) injection What is this medicine? IPILIMUMAB (IP i LIM ue mab) is a monoclonal antibody. It is used to treat colorectal cancer, kidney cancer, liver cancer, lung cancer, melanoma, and mesothelioma. This medicine may be used for other purposes; ask your health care provider or pharmacist if you have questions. COMMON BRAND NAME(S): YERVOY What should I tell my health care provider before I take this medicine? They need to know if you have any of these conditions:  Addison's disease  blood in your stools (black or tarry stools) or if you have blood in your vomit  eye disease, vision problems  history of pancreatitis  history of stomach bleeding  immune system problems  inflammatory bowel disease  kidney disease  liver disease  lupus  myasthenia gravis  organ transplant  rheumatoid arthritis  sarcoidosis  stomach or intestine problems  thyroid disease  tingling of  the fingers or toes, or other nerve disorder  an unusual or allergic reaction to ipilimumab, other medicines, foods, dyes, or preservatives  pregnant or trying to get pregnant  breast-feeding How should I use this medicine? This medicine is for infusion into a vein. It is given by a health care professional in a hospital or clinic setting. A special MedGuide will be given to you before each treatment. Be sure to read this information carefully each time. Talk to your pediatrician regarding the use of this medicine in children. While this drug may be  prescribed for children as young as 12 years for selected conditions, precautions do apply. Overdosage: If you think you have taken too much of this medicine contact a poison control center or emergency room at once. NOTE: This medicine is only for you. Do not share this medicine with others. What if I miss a dose? It is important not to miss your dose. Call your doctor or health care professional if you are unable to keep an appointment. What may interact with this medicine? Interactions are not expected. This list may not describe all possible interactions. Give your health care provider a list of all the medicines, herbs, non-prescription drugs, or dietary supplements you use. Also tell them if you smoke, drink alcohol, or use illegal drugs. Some items may interact with your medicine. What should I watch for while using this medicine? Tell your doctor or healthcare professional if your symptoms do not start to get better or if they get worse. Do not become pregnant while taking this medicine or for 3 months after stopping it. Women should inform their doctor if they wish to become pregnant or think they might be pregnant. There is a potential for serious side effects to an unborn child. Talk to your health care professional or pharmacist for more information. Do not breast-feed an infant while taking this medicine or for 3 months after the last dose. Your condition will be monitored carefully while you are receiving this medicine. You may need blood work done while you are taking this medicine. What side effects may I notice from receiving this medicine? Side effects that you should report to your doctor or health care professional as soon as possible:  allergic reactions like skin rash, itching or hives, swelling of the face, lips, or tongue  black, tarry stools  bloody or watery diarrhea  changes in vision  dizziness  eye pain  fast, irregular heartbeat  feeling anxious  feeling  faint or lightheaded, falls  nausea, vomiting  pain, tingling, numbness in the hands or feet  redness, blistering, peeling or loosening of the skin, including inside the mouth  signs and symptoms of liver injury like dark yellow or brown urine; general ill feeling or flu-like symptoms; light-colored stools; loss of appetite; nausea; right upper belly pain; unusually weak or tired; yellowing of the eyes or skin  unusual bleeding or bruising Side effects that usually do not require medical attention (report to your doctor or health care professional if they continue or are bothersome):  headache  loss of appetite  trouble sleeping This list may not describe all possible side effects. Call your doctor for medical advice about side effects. You may report side effects to FDA at 1-800-FDA-1088. Where should I keep my medicine? This drug is given in a hospital or clinic and will not be stored at home. NOTE: This sheet is a summary. It may not cover all possible information. If you have questions about  this medicine, talk to your doctor, pharmacist, or health care provider.  2020 Elsevier/Gold Standard (2019-04-26 10:56:55)

## 2019-12-20 ENCOUNTER — Telehealth: Payer: Self-pay | Admitting: *Deleted

## 2019-12-20 ENCOUNTER — Other Ambulatory Visit: Payer: Self-pay

## 2019-12-20 ENCOUNTER — Encounter (HOSPITAL_COMMUNITY)
Admission: RE | Admit: 2019-12-20 | Discharge: 2019-12-20 | Disposition: A | Discharge status: Home / Self Care | Payer: Medicare Other | Source: Ambulatory Visit | Attending: Oncology | Admitting: Oncology

## 2019-12-20 DIAGNOSIS — N200 Calculus of kidney: Secondary | ICD-10-CM | POA: Insufficient documentation

## 2019-12-20 DIAGNOSIS — J90 Pleural effusion, not elsewhere classified: Secondary | ICD-10-CM | POA: Diagnosis not present

## 2019-12-20 DIAGNOSIS — I2510 Atherosclerotic heart disease of native coronary artery without angina pectoris: Secondary | ICD-10-CM | POA: Diagnosis not present

## 2019-12-20 DIAGNOSIS — N433 Hydrocele, unspecified: Secondary | ICD-10-CM | POA: Diagnosis not present

## 2019-12-20 DIAGNOSIS — I700 Atherosclerosis of aorta: Secondary | ICD-10-CM | POA: Diagnosis not present

## 2019-12-20 DIAGNOSIS — C439 Malignant melanoma of skin, unspecified: Secondary | ICD-10-CM | POA: Insufficient documentation

## 2019-12-20 DIAGNOSIS — R590 Localized enlarged lymph nodes: Secondary | ICD-10-CM | POA: Diagnosis not present

## 2019-12-20 LAB — GLUCOSE, CAPILLARY: Glucose-Capillary: 105 mg/dL — ABNORMAL HIGH (ref 70–99)

## 2019-12-20 MED ORDER — FLUDEOXYGLUCOSE F - 18 (FDG) INJECTION
7.7000 | Freq: Once | INTRAVENOUS | Status: AC | PRN
  Administered 2019-12-20: 7.7 via INTRAVENOUS

## 2019-12-20 NOTE — Telephone Encounter (Signed)
Called to speak to pt to see how he did with his yervoy/Opdivo.  Spoke with pt's wife, Butch Penny & she reports no changes.  She states that he is so weak & sleeping all the time & doesn't really want to eat.  She doesn't think he has any diarrhea. She reports feeling like they have been dropped off on the side of the road because no one is communicating with them.  He has a PET scan today & would like to have a report from this.  She states she just needs to know what to expect.  Message routed to Dr Alen Blew to update her after PET.

## 2019-12-20 NOTE — Telephone Encounter (Signed)
-----   Message from Ishmael Holter, RN sent at 12/16/2019  3:07 PM EDT ----- Regarding: Dr. Alen Blew 1st [place] f/u call Dr. Alen Blew.Marland KitchenMarland KitchenChange in treatment. 1st tx Opdivo and Yervoy

## 2019-12-21 ENCOUNTER — Other Ambulatory Visit: Payer: Self-pay | Admitting: Oncology

## 2019-12-21 NOTE — Progress Notes (Signed)
The results of the PET scan was communicated to the patient and his wife.  His wife still concerned about his overall health status as he is clinically declining.  She has not noticed any improvement since the last treatment.  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  The overall prognosis and future plan of care were reviewed today.  He does not have any improvement in his disease status in the next few weeks or after the next cycle of therapy, it is a very possible that his best option would be hospice enrollment endoscopic anticancer treatment.  In the meantime we have recommended continued supportive measures including oral hydration and nutritional supplements.

## 2019-12-22 ENCOUNTER — Telehealth: Payer: Self-pay | Admitting: *Deleted

## 2019-12-22 ENCOUNTER — Other Ambulatory Visit: Payer: Self-pay | Admitting: Oncology

## 2019-12-22 MED ORDER — MEGESTROL ACETATE 400 MG/10ML PO SUSP: 400.0000 mg | Freq: Every day | ORAL | 0 refills | Status: AC

## 2019-12-22 NOTE — Telephone Encounter (Signed)
Per previous request for help with decreased appetite, Dr. Alen Blew ordered megace.  Called and advised wife of medication that was ordered.  Advised to try for a couple of weeks and see if that is helpful to increase his appetite.

## 2019-12-22 NOTE — Telephone Encounter (Signed)
Patients wife called to report patient is not eating for her and she is concerned enough about his decreased appetite that she is considering finding marijuana to try for appetite support or unless Dr. Alen Blew has another recommendation.    Primary concern was is he taking any medications that would contraindicated to support naturally in this manner.    Routed to MD to advise.

## 2019-12-22 NOTE — Telephone Encounter (Signed)
I will send a Rx for megace to help with his appetite.

## 2019-12-24 ENCOUNTER — Observation Stay (HOSPITAL_COMMUNITY)
Admission: EM | Admit: 2019-12-24 | Discharge: 2019-12-25 | Disposition: A | Discharge status: Home / Self Care | Payer: Medicare Other | Attending: Internal Medicine | Admitting: Internal Medicine

## 2019-12-24 ENCOUNTER — Emergency Department (HOSPITAL_COMMUNITY): Payer: Medicare Other

## 2019-12-24 ENCOUNTER — Other Ambulatory Visit: Payer: Self-pay

## 2019-12-24 DIAGNOSIS — N179 Acute kidney failure, unspecified: Secondary | ICD-10-CM | POA: Diagnosis not present

## 2019-12-24 DIAGNOSIS — Z20822 Contact with and (suspected) exposure to covid-19: Secondary | ICD-10-CM | POA: Diagnosis not present

## 2019-12-24 DIAGNOSIS — E860 Dehydration: Secondary | ICD-10-CM | POA: Insufficient documentation

## 2019-12-24 DIAGNOSIS — Z7982 Long term (current) use of aspirin: Secondary | ICD-10-CM | POA: Diagnosis not present

## 2019-12-24 DIAGNOSIS — J9601 Acute respiratory failure with hypoxia: Secondary | ICD-10-CM | POA: Diagnosis present

## 2019-12-24 DIAGNOSIS — C439 Malignant melanoma of skin, unspecified: Secondary | ICD-10-CM | POA: Insufficient documentation

## 2019-12-24 DIAGNOSIS — F17220 Nicotine dependence, chewing tobacco, uncomplicated: Secondary | ICD-10-CM | POA: Diagnosis not present

## 2019-12-24 DIAGNOSIS — J9602 Acute respiratory failure with hypercapnia: Principal | ICD-10-CM | POA: Insufficient documentation

## 2019-12-24 DIAGNOSIS — Z79899 Other long term (current) drug therapy: Secondary | ICD-10-CM | POA: Diagnosis not present

## 2019-12-24 DIAGNOSIS — R4189 Other symptoms and signs involving cognitive functions and awareness: Secondary | ICD-10-CM | POA: Diagnosis present

## 2019-12-24 DIAGNOSIS — R6251 Failure to thrive (child): Secondary | ICD-10-CM | POA: Diagnosis present

## 2019-12-24 DIAGNOSIS — E785 Hyperlipidemia, unspecified: Secondary | ICD-10-CM | POA: Diagnosis present

## 2019-12-24 LAB — CBG MONITORING, ED: Glucose-Capillary: 169 mg/dL — ABNORMAL HIGH (ref 70–99)

## 2019-12-24 NOTE — ED Notes (Addendum)
Unable to complete triage due to patients mentation at this time.

## 2019-12-24 NOTE — ED Triage Notes (Signed)
Pt brought from home via [facility]. Pts family states pt passed out and then fell tonight while at home. Pt unresponsive and on 10L NRB mask upon arrival.

## 2019-12-25 ENCOUNTER — Other Ambulatory Visit: Payer: Self-pay

## 2019-12-25 DIAGNOSIS — R4189 Other symptoms and signs involving cognitive functions and awareness: Secondary | ICD-10-CM | POA: Diagnosis present

## 2019-12-25 DIAGNOSIS — C439 Malignant melanoma of skin, unspecified: Secondary | ICD-10-CM | POA: Diagnosis present

## 2019-12-25 DIAGNOSIS — R6251 Failure to thrive (child): Secondary | ICD-10-CM | POA: Diagnosis present

## 2019-12-25 DIAGNOSIS — J9601 Acute respiratory failure with hypoxia: Secondary | ICD-10-CM | POA: Diagnosis present

## 2019-12-25 LAB — BLOOD GAS, ARTERIAL
FIO2: 100
O2 Saturation: 98.8 %
Patient temperature: 37
pCO2 arterial: >120 mmHg (ref 32.0–48.0)
pH, Arterial: 7.152 — CL (ref 7.350–7.450)
pO2, Arterial: 311 mmHg — ABNORMAL HIGH (ref 83.0–108.0)

## 2019-12-25 LAB — COMPREHENSIVE METABOLIC PANEL
ALT: 35 U/L (ref 0–44)
AST: 44 U/L — ABNORMAL HIGH (ref 15–41)
Albumin: 4 g/dL (ref 3.5–5.0)
Alkaline Phosphatase: 67 U/L (ref 38–126)
Anion gap: 12 (ref 5–15)
BUN: 37 mg/dL — ABNORMAL HIGH (ref 8–23)
CO2: 38 mmol/L — ABNORMAL HIGH (ref 22–32)
Calcium: 9.6 mg/dL (ref 8.9–10.3)
Chloride: 91 mmol/L — ABNORMAL LOW (ref 98–111)
Creatinine, Ser: 1.25 mg/dL — ABNORMAL HIGH (ref 0.61–1.24)
GFR calc Af Amer: >60 mL/min (ref >60)
GFR calc non Af Amer: 54 mL/min — ABNORMAL LOW (ref >60)
Glucose, Bld: 183 mg/dL — ABNORMAL HIGH (ref 70–99)
Potassium: 5 mmol/L (ref 3.5–5.1)
Sodium: 141 mmol/L (ref 135–145)
Total Bilirubin: 0.8 mg/dL (ref 0.3–1.2)
Total Protein: 6.4 g/dL — ABNORMAL LOW (ref 6.5–8.1)

## 2019-12-25 LAB — CBC WITH DIFFERENTIAL/PLATELET
Abs Immature Granulocytes: 0.04 10*3/uL (ref 0.00–0.07)
Basophils Absolute: 0 10*3/uL (ref 0.0–0.1)
Basophils Relative: 0 %
Eosinophils Absolute: 0 10*3/uL (ref 0.0–0.5)
Eosinophils Relative: 0 %
HCT: 53.3 % — ABNORMAL HIGH (ref 39.0–52.0)
Hemoglobin: 16.5 g/dL (ref 13.0–17.0)
Immature Granulocytes: 1 %
Lymphocytes Relative: 4 %
Lymphs Abs: 0.4 10*3/uL — ABNORMAL LOW (ref 0.7–4.0)
MCH: 30.4 pg (ref 26.0–34.0)
MCHC: 31 g/dL (ref 30.0–36.0)
MCV: 98.3 fL (ref 80.0–100.0)
Monocytes Absolute: 0.4 10*3/uL (ref 0.1–1.0)
Monocytes Relative: 4 %
Neutro Abs: 7.8 10*3/uL — ABNORMAL HIGH (ref 1.7–7.7)
Neutrophils Relative %: 91 %
Platelets: 327 10*3/uL (ref 150–400)
RBC: 5.42 MIL/uL (ref 4.22–5.81)
RDW: 14.1 % (ref 11.5–15.5)
WBC: 8.6 10*3/uL (ref 4.0–10.5)
nRBC: 0 % (ref 0.0–0.2)

## 2019-12-25 LAB — URINALYSIS, ROUTINE W REFLEX MICROSCOPIC
Glucose, UA: NEGATIVE mg/dL
Ketones, ur: 5 mg/dL — AB
Leukocytes,Ua: NEGATIVE
Nitrite: NEGATIVE
Protein, ur: 100 mg/dL — AB
Specific Gravity, Urine: 1.021 (ref 1.005–1.030)
pH: 5 (ref 5.0–8.0)

## 2019-12-25 LAB — SARS CORONAVIRUS 2 BY RT PCR (HOSPITAL ORDER, PERFORMED IN ~~LOC~~ HOSPITAL LAB): SARS Coronavirus 2: NEGATIVE

## 2019-12-25 MED ORDER — BIOTENE DRY MOUTH MT LIQD: 15.0000 mL | OROMUCOSAL | Status: DC | PRN

## 2019-12-25 MED ORDER — GLYCOPYRROLATE 0.2 MG/ML IJ SOLN: 0.2000 mg | INTRAMUSCULAR | Status: DC | PRN

## 2019-12-25 MED ORDER — GLYCOPYRROLATE 1 MG PO TABS
1.0000 mg | ORAL_TABLET | ORAL | Status: DC | PRN
  Filled 2019-12-25: qty 1

## 2019-12-25 MED ORDER — POLYVINYL ALCOHOL 1.4 % OP SOLN
1.0000 [drp] | Freq: Four times a day (QID) | OPHTHALMIC | Status: DC | PRN
  Filled 2019-12-25: qty 15

## 2019-12-25 MED ORDER — ONDANSETRON HCL 4 MG/2ML IJ SOLN
4.0000 mg | Freq: Four times a day (QID) | INTRAMUSCULAR | Status: DC | PRN
  Administered 2019-12-25: 4 mg via INTRAVENOUS
  Filled 2019-12-25: qty 2

## 2019-12-25 MED ORDER — MORPHINE SULFATE (PF) 2 MG/ML IV SOLN
1.0000 mg | INTRAVENOUS | Status: DC | PRN
  Administered 2019-12-25: 1 mg via INTRAVENOUS
  Filled 2019-12-25: qty 1

## 2019-12-25 MED ORDER — LORAZEPAM 2 MG/ML IJ SOLN: 2.0000 mg | INTRAMUSCULAR | Status: DC

## 2019-12-25 MED ORDER — MORPHINE 100MG IN NS 100ML (1MG/ML) PREMIX INFUSION
10.0000 mg/h | INTRAVENOUS | Status: DC
  Administered 2019-12-25: 10 mg/h via INTRAVENOUS
  Filled 2019-12-25: qty 100

## 2019-12-25 MED ORDER — ATROPINE SULFATE 1 % OP SOLN
4.0000 [drp] | OPHTHALMIC | Status: DC | PRN
  Filled 2019-12-25: qty 2

## 2019-12-25 MED ORDER — MORPHINE 100MG IN NS 100ML (1MG/ML) PREMIX INFUSION
INTRAVENOUS | Status: AC
  Filled 2019-12-25: qty 100

## 2019-12-25 MED ORDER — ONDANSETRON 4 MG PO TBDP: 4.0000 mg | ORAL_TABLET | Freq: Four times a day (QID) | ORAL | Status: DC | PRN

## 2019-12-25 MED ORDER — GLYCOPYRROLATE 0.2 MG/ML IJ SOLN
0.2000 mg | INTRAMUSCULAR | Status: DC | PRN
  Administered 2019-12-25: 0.2 mg via INTRAVENOUS
  Filled 2019-12-25: qty 1

## 2020-01-06 ENCOUNTER — Other Ambulatory Visit: Payer: Medicare Other

## 2020-01-06 ENCOUNTER — Ambulatory Visit: Payer: Medicare Other

## 2020-01-06 ENCOUNTER — Ambulatory Visit: Payer: Medicare Other | Admitting: Oncology

## 2020-01-19 NOTE — ED Provider Notes (Addendum)
[facility] EMERGENCY DEPARTMENT Provider Note   CSN: [id number] Arrival date & time: 12/24/19  2337     History Chief Complaint  Patient presents with  . Loss of Consciousness    Jacob Conner is a 79 y.o. male.  HPI     This is an 79 year old male with a history of malignant melanoma, hyperlipidemia, thyroid disease who presents with altered mental status and hypoxia.  Per EMS, had a syncopal episode at home.  Recent generalized decline per the patient's wife.  Per EMS he was unresponsive in route and hypoxic requiring nonrebreather.  Patient is unable to provide any history.  Level 5 caveat for acuity of condition  Past Medical History:  Diagnosis Date  . Arthritis    "pretty much all over; hands, shoulders, knees, back" (04/21/2018)  . BPH (benign prostatic hyperplasia)   . Carotid artery occlusion   . 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 Dyspnea    on exertion  . GERD (gastroesophageal reflux disease)   . Graves' disease dx'd 2015   "on daily RX" (04/21/2018)  . History of kidney stones   . Hyperlipidemia   . Internal hemorrhoids   . Melanoma ([place])    "cut off back and arm; total of 4" (04/21/2018)  . Prostate disorder   . Seasonal allergies    "hay fever in spring" (04/21/2018)  . Stroke [facility])    ?,  pt nor family  was not aware of having a stroke/frontal lobe infarct (04/21/2018)  . Thyroid disease    Hyperthyroidism  and Graves  . Tubular adenoma of colon     Patient Active Problem List   Diagnosis Date Noted  . Melanoma of skin ([place]) 09/23/2019  . Goals of care, counseling/discussion 09/23/2019  . Lumbar spine scoliosis 04/20/2018  . Chronic midline low back pain without sciatica 01/27/2018  . Carotid artery stenosis 07/06/2017  . 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 Graves disease 10/12/2015  . 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 Hyperthyroidism 06/08/2013  . BPH (benign prostatic hyperplasia) 04/19/2013  . Hyperlipidemia 04/19/2013  . 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 Spermatocele 04/19/2013  . Hx of colonic polyp 04/19/2013  . History of migraine headaches 04/19/2013    Past Surgical History:  Procedure Laterality Date  . ANTERIOR LAT LUMBAR FUSION Left 04/20/2018   Procedure: Lumbar two-three Lumbar three-four Lumbar four-five Anterolateral decompression with posterior percutaneous arthrodesis, MAZOR;  Surgeon: Kristeen Miss, MD;  Location: [place];  Service: Neurosurgery;  Laterality: Left;  . APPLICATION OF ROBOTIC ASSISTANCE FOR SPINAL PROCEDURE N/A 04/20/2018   Procedure: APPLICATION OF ROBOTIC ASSISTANCE FOR SPINAL PROCEDURE;  Surgeon: Kristeen Miss, MD;  Location: [place];  Service: Neurosurgery;  Laterality: N/A;  . BACK SURGERY    . COLONOSCOPY  2019  . ENDARTERECTOMY Right 07/06/2017   Procedure: ENDARTERECTOMY CAROTID RIGHT;  Surgeon: Waynetta Sandy, MD;  Location: [place];  Service: Vascular;  Laterality: Right;  . ESOPHAGOGASTRODUODENOSCOPY    . HYDROCELE EXCISION    . LITHOTRIPSY  X 2  . LUMBAR PERCUTANEOUS PEDICLE SCREW 3 LEVEL N/A 04/20/2018   Procedure: LUMBAR PERCUTANEOUS PEDICLE SCREW THREE LEVEL;  Surgeon: Kristeen Miss, MD;  Location: [place];  Service: Neurosurgery;  Laterality: N/A;  . MELANOMA EXCISION  X 4   "back, right arm"  . PATCH ANGIOPLASTY Right 07/06/2017   Procedure: PATCH ANGIOPLASTY RIGHT  CAROTID ARTERY;  Surgeon: Waynetta Sandy, MD;  Location: [facility] OR;  Service: Vascular;  Laterality: Right;       Family History  Problem Relation Age of Onset  . Liver disease Mother   . Colon cancer Father   . Heart disease Father   . Esophageal cancer Neg Hx   . Rectal cancer Neg Hx   . Stomach cancer Neg Hx     Social History   Tobacco Use  . Smoking status: Never Smoker  . Smokeless tobacco: Current User    Types:  Chew  . Tobacco comment: chew 1 pack per day since he was 79 years old  Substance Use Topics  . Alcohol use: No  . Drug use: Never    Home Medications Prior to Admission medications   Medication Sig Start Date End Date Taking? Authorizing Provider  AMBULATORY NON FORMULARY MEDICATION Take 180 mg by mouth daily. Medication Name: bempedoic acid 180 mgs vs a placebo, study drug provided 10/16/17   Lelon Perla, MD  aspirin EC 81 MG tablet Take 81 mg by mouth daily.    [provider]  dexamethasone (DECADRON) 1 MG tablet 2 tablets twice daily for 2 days, one tablet twice daily for 2 days, one tablet daily for 2 days. Patient not taking: Reported on 12/16/2019 04/22/18   Kristeen Miss, MD  finasteride (PROSCAR) 5 MG tablet Take 1 tablet (5 mg total) by mouth daily. 02/08/19   Terald Sleeper, PA-C  furosemide (LASIX) 20 MG tablet TAKE 1 TABLET BY MOUTH ONCE DAILY. 07/06/19   Terald Sleeper, PA-C  gabapentin (NEURONTIN) 100 MG capsule Take 2 capsules (200 mg total) by mouth 2 (two) times daily. 11/15/19 02/13/20  Wyatt Portela, MD  hydroxypropyl methylcellulose / hypromellose (ISOPTO TEARS / GONIOVISC) 2.5 % ophthalmic solution Place 2 drops into both eyes as needed for dry eyes (IRRITATION).    [provider]  megestrol (MEGACE) 400 MG/10ML suspension Take 10 mLs (400 mg total) by mouth daily. 12/22/19   Wyatt Portela, MD  methimazole (TAPAZOLE) 5 MG tablet Take 5 mg by mouth daily.  10/17/16   [provider]  mirtazapine (REMERON) 7.5 MG tablet Take 1 tablet (7.5 mg total) by mouth at bedtime. Patient not taking: Reported on 12/16/2019 11/30/19   Harle Stanford., PA-C  predniSONE (STERAPRED UNI-PAK 21 TAB) 10 MG (21) TBPK tablet  11/03/19   [provider]  prochlorperazine (COMPAZINE) 10 MG tablet Take 1 tablet (10 mg total) by mouth every 6 (six) hours as needed for nausea or vomiting. Patient not taking: Reported on 12/16/2019 09/23/19   Wyatt Portela, MD    traMADol (ULTRAM) 50 MG tablet Take 1 tablet (50 mg total) by mouth every 6 (six) hours as needed. 10/13/19   Wyatt Portela, MD  Vitamin D, Ergocalciferol, (DRISDOL) 1.25 MG (50000 UT) CAPS capsule Take 1 capsule (50,000 Units total) by mouth once a week. 02/08/19   Terald Sleeper, PA-C    Allergies    Topamax [topiramate]  Review of Systems   Review of Systems  Unable to perform ROS: Acuity of condition    Physical Exam Updated Vital Signs BP 95/68   Pulse (!) 151   Resp (!) 34   SpO2 100%   Physical Exam Vitals and nursing note reviewed.  Constitutional:      Appearance: He is well-developed. He is ill-appearing.     Comments: Mild respiratory distress noted with tachypnea  HENT:  Head: Normocephalic and atraumatic.     Mouth/Throat:     Mouth: Mucous membranes are dry.  Eyes:     Pupils: Pupils are equal, round, and reactive to light.  Cardiovascular:     Rate and Rhythm: Regular rhythm. Tachycardia present.     Heart sounds: Normal heart sounds. No murmur.  Pulmonary:     Effort: Pulmonary effort is normal. No respiratory distress.     Breath sounds: Normal breath sounds. No wheezing.  Abdominal:     General: Bowel sounds are normal.     Palpations: Abdomen is soft.     Tenderness: There is no abdominal tenderness. There is no guarding or rebound.  Musculoskeletal:     Cervical back: Neck supple.     Left lower leg: No edema.  Skin:    General: Skin is warm and dry.  Neurological:     Mental Status: He is alert.     Comments: Eyes open but patient not responsive, does not follow commands, obtunded  Psychiatric:     Comments: Unable to assess     ED Results / Procedures / Treatments   Labs (all labs ordered are listed, but only abnormal results are displayed) Labs Reviewed  CBC WITH DIFFERENTIAL/PLATELET - Abnormal; Notable for the following components:      Result Value   HCT 53.3 (*)    Neutro Abs 7.8 (*)    Lymphs Abs 0.4 (*)    All other  components within normal limits  COMPREHENSIVE METABOLIC PANEL - Abnormal; Notable for the following components:   Chloride 91 (*)    CO2 38 (*)    Glucose, Bld 183 (*)    BUN 37 (*)    Creatinine, Ser 1.25 (*)    Total Protein 6.4 (*)    AST 44 (*)    GFR calc non Af Amer 54 (*)    All other components within normal limits  BLOOD GAS, ARTERIAL - Abnormal; Notable for the following components:   pH, Arterial 7.152 (*)    pCO2 arterial >120.0 (*)    pO2, Arterial 311 (*)    All other components within normal limits  CBG MONITORING, ED - Abnormal; Notable for the following components:   Glucose-Capillary 169 (*)    All other components within normal limits  SARS CORONAVIRUS 2 BY RT PCR (HOSPITAL ORDER, [place] [facility])  LACTIC ACID, PLASMA  LACTIC ACID, PLASMA  URINALYSIS, ROUTINE W REFLEX MICROSCOPIC    EKG EKG Interpretation  Date/Time:  Saturday December 24 2019 23:42:22 EDT Ventricular Rate:  124 PR Interval:    QRS Duration: 81 QT Interval:  273 QTC Calculation: 392 R Axis:   61 Text Interpretation: Sinus tachycardia Probable left atrial enlargement Probable anterior infarct, old Borderline T abnormalities, inferior leads Confirmed by Thayer Jew [id number]) on 04-Jan-2020 1:00:36 AM   Radiology DG Chest Portable 1 View  Result Date: 01/04/20 CLINICAL DATA:  Hypoxia, syncope, fall EXAM: PORTABLE CHEST 1 VIEW COMPARISON:  Radiograph 03/02/2018, 10/14/2016, 04/28/2014 FINDINGS: Lung volumes are low with streaky bandlike opacities in the bases favoring atelectasis. There is mild central vascular crowding without frank congestion or features of edema. No consolidative opacity. No pneumothorax or visible effusion. Cardiomediastinal contours are similar to priors accounting for differences in technique. Aortic atherosclerosis is noted. Mild gaseous distention of the stomach is nonspecific. Additional air-filled loops of colon are present as well. No acute  osseous abnormality. Degenerative changes are present in the imaged spine and  shoulders. Telemetry leads overlie the chest. IMPRESSION: 1. Low lung volumes with streaky bandlike opacities in the bases favoring atelectasis. 2. Mild gaseous distention of the stomach and colon is nonspecific. Correlate with abdominal symptoms. 3.  Aortic Atherosclerosis (ICD10-I70.0). Electronically Signed   By: Lovena Le M.D.   On: 01/06/20 00:50    Procedures Procedures (including critical care time)  CRITICAL CARE Performed by: Merryl Hacker   Total critical care time: 65 minutes  Critical care time was exclusive of separately billable procedures and treating other patients.  Critical care was necessary to treat or prevent imminent or life-threatening deterioration.  Critical care was time spent personally by me on the following activities: development of treatment plan with patient and/or surrogate as well as nursing, discussions with consultants, evaluation of patient's response to treatment, examination of patient, obtaining history from patient or surrogate, ordering and performing treatments and interventions, ordering and review of laboratory studies, ordering and review of radiographic studies, pulse oximetry and re-evaluation of patient's condition.   Medications Ordered in ED Medications  ondansetron (ZOFRAN-ODT) disintegrating tablet 4 mg ( Oral See Alternative 06-Jan-2020 0019)    Or  ondansetron (ZOFRAN) injection 4 mg (4 mg Intravenous Given Jan 06, 2020 0019)  glycopyrrolate (ROBINUL) tablet 1 mg ( Oral See Alternative 2020/01/06 0019)    Or  glycopyrrolate (ROBINUL) injection 0.2 mg ( Subcutaneous See Alternative Jan 06, 2020 0019)    Or  glycopyrrolate (ROBINUL) injection 0.2 mg (0.2 mg Intravenous Given 2020/01/06 0019)  antiseptic oral rinse (BIOTENE) solution 15 mL (has no administration in time range)  polyvinyl alcohol (LIQUIFILM TEARS) 1.4 % ophthalmic solution 1 drop (has no administration in  time range)  morphine 2 MG/ML injection 1 mg (1 mg Intravenous Given 2020/01/06 0019)    ED Course  I have reviewed the triage vital signs and the nursing notes.  Pertinent labs & imaging results that were available during my care of the patient were reviewed by me and considered in my medical decision making (see chart for details).  Clinical Course as of Dec 25 102  Nancy Fetter 01-06-2020  0005 Son is at the bedside.  Had a long discussion with the son also and conversation with the patient's wife.  Patient does not wish to have life prolonging measures.  Patient was made DNR/DNI.   [CH]  0103 Noted to be in hypercarbic respiratory failure with a pH of 7.152 and a CO2 of greater than 120.  Discussed the option of BiPAP to correct hypercarbia.  However would be at increased risk for aspiration given mental status.  Given goals of care, son discussed with patient's wife who would like him to be made comfortable and would not like to pursue BiPAP.  Blood cultures canceled.  Patient given fluids.  He is on a nonrebreather for oxygen saturation.  I placed comfort care orders for morphine, Zofran, and Robinul.   [CH]    Clinical Course User Index [CH] Domitila Stetler, Barbette Hair, MD   MDM Rules/Calculators/A&P                       Patient presents with altered mental status, hypoxia.  Recent history of generalized decline.  History of malignant melanoma.  I reviewed his chart.  He is still undergoing treatment.  Son was updated at the bedside.  We discussed goals of care.  They wish to make him DNR, DNI per his wishes although he is unable to communicate this with me.  I feel  this is reasonable given his prognosis.  We did discuss doing some work-up to investigate etiology.  Son is agreeable.  See clinical course above.  Patient noted to be in hypercarbic, hypoxic respiratory failure with a respiratory acidosis pH of 7.152 and a CO2 greater than 120.  They declined BiPAP given risks and goals of care.  EKG without  arrhythmia.  Does show sinus tachycardia.  Chest x-ray without pneumothorax or pneumonia.  Wish to make him comfortable.  I canceled blood cultures.  He does appear clinically dehydrated and has a slight AKI on BMP.  Comfort measures were ordered for the patient.  We will plan for no additional blood work and have palliative care evaluate the patient.  Son was updated multiple times at the bedside.  He was provided with support.  Offered for the patient's wife to come to the bedside as well.  Final Clinical Impression(s) / ED Diagnoses Final diagnoses:  Acute respiratory failure with hypoxia and hypercapnia ([place])  Dehydration  AKI (acute kidney injury) ([place])  Metastatic melanoma ([place])    Rx / DC Orders ED Discharge Orders    None       Roland Lipke, Barbette Hair, MD December 28, 2019 0109    Merryl Hacker, MD 12-28-19 0110

## 2020-01-19 NOTE — Plan of Care (Signed)
Pt expired at 12:35. Attending notified, [place] [facility] called [facility]), family given time to grieve. Emotional support given. Dr. Joesph Fillers provided the death certificate. Post mortem care performed. Patient taken down via stretcher.

## 2020-01-19 NOTE — Plan of Care (Signed)
Pt expired.

## 2020-01-19 NOTE — H&P (Signed)
History and Physical    Jacob Conner ZOX:[id number] DOB: 10-Jun-1941 DOA: 12/24/2019  PCP: Claretta Fraise, MD (Confirm with patient/family/NH records and if not entered, this has to be entered at [facility] point of entry) Patient coming from: Home  I have personally briefly reviewed patient's old medical records in [place]  Chief Complaint: Altered mental status and hypoxia  HPI: Jacob Conner is a 79 y.o. male with medical history significant of malignant melanoma, BPH, Graves' disease, GERD and hyperlipidemia who is brought by EMS for evaluation of altered mental status and hypoxia.  Patient is not responsive to verbal commands therefore history is gathered from ED physician and also patient's son who is present at the bedside.  According to the ED physician, EMS reported that patient had a syncopal episode at home and he was unresponsive and severely hypoxic on route to the hospital and was started on nonrebreather mask.  According to the son patient recently had drastic and continuous decline in his health and also reported that patient did not want to have life prolonging measures therefore patient was made DNR and DNI and later on after discussion with ED physician they made the patient comfort care only.    ED Course: Patient was severely hypoxic and unresponsive on arrival to the ED and made DNR and comfort care by the family.  Review of Systems: Review of systems could not be obtained because of patient's current condition Past Medical History:  Diagnosis Date  . Arthritis    "pretty much all over; hands, shoulders, knees, back" (04/21/2018)  . BPH (benign prostatic hyperplasia)   . Carotid artery occlusion   . 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 Dyspnea    on exertion  . GERD (gastroesophageal reflux disease)   . Graves' disease dx'd 2015   "on daily RX" (04/21/2018)  . History  of kidney stones   . Hyperlipidemia   . Internal hemorrhoids   . Melanoma ([place])    "cut off back and arm; total of 4" (04/21/2018)  . Prostate disorder   . Seasonal allergies    "hay fever in spring" (04/21/2018)  . Stroke [facility])    ?,  pt nor family  was not aware of having a stroke/frontal lobe infarct (04/21/2018)  . Thyroid disease    Hyperthyroidism  and Graves  . Tubular adenoma of colon     Past Surgical History:  Procedure Laterality Date  . ANTERIOR LAT LUMBAR FUSION Left 04/20/2018   Procedure: Lumbar two-three Lumbar three-four Lumbar four-five Anterolateral decompression with posterior percutaneous arthrodesis, MAZOR;  Surgeon: Kristeen Miss, MD;  Location: [place];  Service: Neurosurgery;  Laterality: Left;  . APPLICATION OF ROBOTIC ASSISTANCE FOR SPINAL PROCEDURE N/A 04/20/2018   Procedure: APPLICATION OF ROBOTIC ASSISTANCE FOR SPINAL PROCEDURE;  Surgeon: Kristeen Miss, MD;  Location: [place];  Service: Neurosurgery;  Laterality: N/A;  . BACK SURGERY    . COLONOSCOPY  2019  . ENDARTERECTOMY Right 07/06/2017   Procedure: ENDARTERECTOMY CAROTID RIGHT;  Surgeon: Waynetta Sandy, MD;  Location: [place];  Service: Vascular;  Laterality: Right;  . ESOPHAGOGASTRODUODENOSCOPY    . HYDROCELE EXCISION    . LITHOTRIPSY  X 2  . LUMBAR PERCUTANEOUS PEDICLE SCREW 3 LEVEL N/A 04/20/2018   Procedure: LUMBAR PERCUTANEOUS PEDICLE SCREW THREE LEVEL;  Surgeon: Kristeen Miss, MD;  Location: [place];  Service: Neurosurgery;  Laterality: N/A;  . MELANOMA EXCISION  X 4   "  back, right arm"  . PATCH ANGIOPLASTY Right 07/06/2017   Procedure: PATCH ANGIOPLASTY RIGHT CAROTID ARTERY;  Surgeon: Waynetta Sandy, MD;  Location: [place];  Service: Vascular;  Laterality: Right;     reports that he has never smoked. His smokeless tobacco use includes chew. He reports that he does not drink alcohol or use drugs.  Allergies  Allergen Reactions  . Topamax [Topiramate] Other (See Comments)     Cognitive clouding     Family History  Problem Relation Age of Onset  . Liver disease Mother   . Colon cancer Father   . Heart disease Father   . Esophageal cancer Neg Hx   . Rectal cancer Neg Hx   . Stomach cancer Neg Hx      Prior to Admission medications   Medication Sig Start Date End Date Taking? Authorizing Provider  AMBULATORY NON FORMULARY MEDICATION Take 180 mg by mouth daily. Medication Name: bempedoic acid 180 mgs vs a placebo, study drug provided 10/16/17   Lelon Perla, MD  aspirin EC 81 MG tablet Take 81 mg by mouth daily.    [provider]  dexamethasone (DECADRON) 1 MG tablet 2 tablets twice daily for 2 days, one tablet twice daily for 2 days, one tablet daily for 2 days. Patient not taking: Reported on 12/16/2019 04/22/18   Kristeen Miss, MD  finasteride (PROSCAR) 5 MG tablet Take 1 tablet (5 mg total) by mouth daily. 02/08/19   Terald Sleeper, PA-C  furosemide (LASIX) 20 MG tablet TAKE 1 TABLET BY MOUTH ONCE DAILY. 07/06/19   Terald Sleeper, PA-C  gabapentin (NEURONTIN) 100 MG capsule Take 2 capsules (200 mg total) by mouth 2 (two) times daily. 11/15/19 02/13/20  Wyatt Portela, MD  hydroxypropyl methylcellulose / hypromellose (ISOPTO TEARS / GONIOVISC) 2.5 % ophthalmic solution Place 2 drops into both eyes as needed for dry eyes (IRRITATION).    [provider]  megestrol (MEGACE) 400 MG/10ML suspension Take 10 mLs (400 mg total) by mouth daily. 12/22/19   Wyatt Portela, MD  methimazole (TAPAZOLE) 5 MG tablet Take 5 mg by mouth daily.  10/17/16   [provider]  mirtazapine (REMERON) 7.5 MG tablet Take 1 tablet (7.5 mg total) by mouth at bedtime. Patient not taking: Reported on 12/16/2019 11/30/19   Harle Stanford., PA-C  predniSONE (STERAPRED UNI-PAK 21 TAB) 10 MG (21) TBPK tablet  11/03/19   [provider]  prochlorperazine (COMPAZINE) 10 MG tablet Take 1 tablet (10 mg total) by mouth every 6 (six) hours as needed for nausea or  vomiting. Patient not taking: Reported on 12/16/2019 09/23/19   Wyatt Portela, MD  traMADol (ULTRAM) 50 MG tablet Take 1 tablet (50 mg total) by mouth every 6 (six) hours as needed. 10/13/19   Wyatt Portela, MD  Vitamin D, Ergocalciferol, (DRISDOL) 1.25 MG (50000 UT) CAPS capsule Take 1 capsule (50,000 Units total) by mouth once a week. 02/08/19   Terald Sleeper, PA-C    Physical Exam: Vitals:   01/22/2020 0230 2020-01-22 0300 22-Jan-2020 0357 2020/01/22 0400  BP: 110/76 104/74 103/70   Pulse: (!) 108 (!) 102 91   Resp: (!) 31 (!) 32 (!) 30   Temp:   (!) 96.8 F (36 C)   TempSrc:   Axillary   SpO2: 100% 100% 93%   Weight:    58.9 kg  Height:    5\' 4"  (1.626 m)    Constitutional: NAD, calm, comfortable Vitals:  01-19-20 0230 01/19/20 0300 19-Jan-2020 0357 01/19/2020 0400  BP: 110/76 104/74 103/70   Pulse: (!) 108 (!) 102 91   Resp: (!) 31 (!) 32 (!) 30   Temp:   (!) 96.8 F (36 C)   TempSrc:   Axillary   SpO2: 100% 100% 93%   Weight:    58.9 kg  Height:    5\' 4"  (1.626 m)    General: Patient is 79 year old cachectic Caucasian male who is not responsive and on nonrebreather mask at this time. Eyes: PERRL, lids and conjunctivae normal ENMT: Mucous membranes and lips look mildly dry. Posterior pharynx clear of any exudate or lesions.Normal dentition.  Neck: normal, supple, no masses, no thyromegaly Respiratory: Patient is on nonrebreather mask.  Diminished breath sounds in bilateral lungs bases without wheezing, Rales or rhonchi Cardiovascular: Regular rate and rhythm, no murmurs / rubs / gallops. No extremity edema. 2+ pedal pulses. No carotid bruits.  Abdomen: no tenderness, no masses palpated. No hepatosplenomegaly. Bowel sounds positive.  Musculoskeletal: no clubbing / cyanosis. No joint deformity upper and lower extremities.  Range of motion could not be checked Skin: no rashes, lesions, ulcers. No induration Neurologic: Patient is not responsive to verbal and pain stimulus therefore  neurological and psychiatric evaluation could not be done at this time. (Anything < 9 systems with 2 bullets each down codes to level 1) (If patient refuses exam can't bill higher level) (Make sure to document decubitus ulcers present on admission -- if possible -- and whether patient has chronic indwelling catheter at time of admission)  Labs on Admission: I have personally reviewed following labs and imaging studies  CBC: Recent Labs  Lab 12/24/19 2345  WBC 8.6  NEUTROABS 7.8*  HGB 16.5  HCT 53.3*  MCV 98.3  PLT 169   Basic Metabolic Panel: Recent Labs  Lab 12/24/19 2345  NA 141  K 5.0  CL 91*  CO2 38*  GLUCOSE 183*  BUN 37*  CREATININE 1.25*  CALCIUM 9.6   GFR: Estimated Creatinine Clearance: 39.3 mL/min (A) (by C-G formula based on SCr of 1.25 mg/dL (H)). Liver Function Tests: Recent Labs  Lab 12/24/19 2345  AST 44*  ALT 35  ALKPHOS 67  BILITOT 0.8  PROT 6.4*  ALBUMIN 4.0   No results for input(s): LIPASE, AMYLASE in the last 168 hours. No results for input(s): AMMONIA in the last 168 hours. Coagulation Profile: No results for input(s): INR, PROTIME in the last 168 hours. Cardiac Enzymes: No results for input(s): CKTOTAL, CKMB, CKMBINDEX, TROPONINI in the last 168 hours. BNP (last 3 results) No results for input(s): PROBNP in the last 8760 hours. HbA1C: No results for input(s): HGBA1C in the last 72 hours. CBG: Recent Labs  Lab 12/20/19 1251 12/24/19 2341  GLUCAP 105* 169*   Lipid Profile: No results for input(s): CHOL, HDL, LDLCALC, TRIG, CHOLHDL, LDLDIRECT in the last 72 hours. Thyroid Function Tests: No results for input(s): TSH, T4TOTAL, FREET4, T3FREE, THYROIDAB in the last 72 hours. Anemia Panel: No results for input(s): VITAMINB12, FOLATE, FERRITIN, TIBC, IRON, RETICCTPCT in the last 72 hours. Urine analysis:    Component Value Date/Time   COLORURINE AMBER (A) 12/24/2019 2346   APPEARANCEUR CLOUDY (A) 12/24/2019 2346   APPEARANCEUR  Clear 10/23/2017 1206   LABSPEC 1.021 12/24/2019 2346   PHURINE 5.0 12/24/2019 2346   GLUCOSEU NEGATIVE 12/24/2019 2346   HGBUR LARGE (A) 12/24/2019 2346   BILIRUBINUR SMALL (A) 12/24/2019 2346   BILIRUBINUR Negative 10/23/2017 1206   KETONESUR  5 (A) 12/24/2019 2346   PROTEINUR 100 (A) 12/24/2019 2346   UROBILINOGEN negative 04/28/2014 1252   NITRITE NEGATIVE 12/24/2019 2346   LEUKOCYTESUR NEGATIVE 12/24/2019 2346    Radiological Exams on Admission: DG Chest Portable 1 View  Result Date: 01/12/2020 CLINICAL DATA:  Hypoxia, syncope, fall EXAM: PORTABLE CHEST 1 VIEW COMPARISON:  Radiograph 03/02/2018, 10/14/2016, 04/28/2014 FINDINGS: Lung volumes are low with streaky bandlike opacities in the bases favoring atelectasis. There is mild central vascular crowding without frank congestion or features of edema. No consolidative opacity. No pneumothorax or visible effusion. Cardiomediastinal contours are similar to priors accounting for differences in technique. Aortic atherosclerosis is noted. Mild gaseous distention of the stomach is nonspecific. Additional air-filled loops of colon are present as well. No acute osseous abnormality. Degenerative changes are present in the imaged spine and shoulders. Telemetry leads overlie the chest. IMPRESSION: 1. Low lung volumes with streaky bandlike opacities in the bases favoring atelectasis. 2. Mild gaseous distention of the stomach and colon is nonspecific. Correlate with abdominal symptoms. 3.  Aortic Atherosclerosis (ICD10-I70.0). Electronically Signed   By: Lovena Le M.D.   On: 01-12-20 00:50      Assessment/Plan Principal Problem:   Acute respiratory failure with hypoxia (HCC) Patient was severely hypoxic and started on nonrebreather mask by EMS in route to the hospital. Patient is made DNR and comfort care only by the family and patient will be placed on nasal cannula.  Active Problems:   Unresponsive state   Failure to thrive (0-17)    Malignant melanoma (HCC) Hyperlipidemia Graves' disease  Patient is on comfort care   DVT prophylaxis: None  code Status: DNR/comfort care Family Communication: Patient's son present at the bedside Disposition Plan:  Consults called none Admission status: Observation/MedSurg   Edmonia Lynch MD [facility] Pager [phone]  If 7PM-7AM, please contact night-coverage [email or web address] Password [email or web address]  01/12/20, 4:51 AM

## 2020-01-19 NOTE — Death Summary Note (Signed)
Death Summary  Jacob Conner MAY:[id number] DOB: Dec 03, 1940 DOA: 2019-12-27  PCP: Claretta Fraise, MD  Admit date: 2019-12-27 Date of Death: 2019-12-28 Time of Death: 77 35 pm Notification: Claretta Fraise, MD notified of death of 2019/12/28   History of present illness:  Jacob Conner is a 79 y.o. male with a history of malignant melanoma, Graves' disease, GERD, BPH, hyperlipidemia brought in by EMS for altered mental status and severe hypoxia.  On arrival to ED he was not responsive to verbal commands.  And also had a syncopal episode at home.  Per family patient had significant drastic and continuous decline in his health and that patient did not want to be on life prolonging measures done and wanted to be kept comfortable and let nature takes its course.  Patient was admitted to the hospital as comfort care only.  Upon admission his PCO2 was greater than 120 with a pH of 7.15.  He was treated with morphine Zofran and Robinul  Final Diagnoses:  1.  Acute hypercapnic respiratory failure patient was made comfort care upon admission.  He passed away at 12:35 PM on 2019/12/28.   The results of significant diagnostics from this hospitalization (including imaging, microbiology, ancillary and laboratory) are listed below for reference.    Significant Diagnostic Studies: NM PET Image Restage (PS) Whole Body  Result Date: 12/21/2019 CLINICAL DATA:  Subsequent treatment strategy for melanoma of the upper back, excision in May 2020. EXAM: NUCLEAR MEDICINE PET WHOLE BODY TECHNIQUE: 7.7 mCi F-18 FDG was injected intravenously. Full-ring PET imaging was performed from the skull base to thigh after the radiotracer. CT data was obtained and used for attenuation correction and anatomic localization. Fasting blood glucose: 105 mg/dl COMPARISON:  10/09/2019 FINDINGS: Mediastinal blood pool activity: SUV max 2.2 HEAD/NECK: N/A Incidental CT findings: None CHEST: Worsening left axillary adenopathy and worsening  subcutaneous tumors along the left axilla and tracking posteriorly along the axilla into the subcutaneous tissues the left upper back. One of various hypermetabolic left axillary lymph nodes measures 2.5 cm in short axis on image 92/4 (formerly 1.5 cm) with maximum SUV 25.0 (formerly 16.2). An index mass posteriorly in the subcutaneous tissues the left back measures 1.7 cm in short axis on image 89/4, previously 1.3 cm, with maximum SUV 12.7 (formerly 17.5). A new oval-shaped subcutaneous mass more caudad along the left back region measures 3.7 by 1.7 cm on image 101/4 and has a maximum SUV of 13.9. Additional new and enlarged tumors extend along the left axilla and subcutaneous tissues around the left back. Incidental CT findings: Coronary, aortic arch, and branch vessel atherosclerotic vascular disease. Mild scarring in the right middle lobe. Mild atelectasis in the left lower lobe although reduced from previous. Faintly accentuated activity along the inferior wall of the right ventricle, likely incidental. Trace right pleural effusion. ABDOMEN/PELVIS: No significant abnormal hypermetabolic activity in this region. Incidental CT findings: Bilateral nonobstructive nephrolithiasis. Aortoiliac atherosclerotic vascular disease. Small left hydrocele. SKELETON: Subtle accentuated activity likely along a Schmorl's node in the thoracic spine at the T7 inferior endplate, maximum SUV 3.9, merits surveillance. This is most likely inflammatory. Incidental CT findings: Degenerative glenohumeral arthropathy, right greater than left. Postoperative findings in the lower lumbar spine. EXTREMITIES: No significant abnormal hypermetabolic activity in this region. Incidental CT findings: Superficial venous activity in the right upper extremity is likely incidental. Trace right knee joint effusion. Atherosclerosis. IMPRESSION: 1. Worsening hypermetabolic left axillary adenopathy and worsening confluent hypermetabolic subcutaneous  tumors tracking along the left posterior  axilla and into the left upper back subcutaneous tissues. 2. Faintly accentuated activity in the vicinity of a Schmorl's node along the inferior endplate of T7, likely inflammatory, merits surveillance. 3. Other imaging findings of potential clinical significance: Aortic Atherosclerosis (ICD10-I70.0). Coronary atherosclerosis. Trace right pleural effusion. Bilateral nonobstructive nephrolithiasis. Small left scrotal hydrocele. Right greater than left degenerative glenohumeral arthropathy. Electronically Signed   By: Van Clines M.D.   On: 12/21/2019 08:26   DG Chest Portable 1 View  Result Date: 2020-01-20 CLINICAL DATA:  Hypoxia, syncope, fall EXAM: PORTABLE CHEST 1 VIEW COMPARISON:  Radiograph 03/02/2018, 10/14/2016, 04/28/2014 FINDINGS: Lung volumes are low with streaky bandlike opacities in the bases favoring atelectasis. There is mild central vascular crowding without frank congestion or features of edema. No consolidative opacity. No pneumothorax or visible effusion. Cardiomediastinal contours are similar to priors accounting for differences in technique. Aortic atherosclerosis is noted. Mild gaseous distention of the stomach is nonspecific. Additional air-filled loops of colon are present as well. No acute osseous abnormality. Degenerative changes are present in the imaged spine and shoulders. Telemetry leads overlie the chest. IMPRESSION: 1. Low lung volumes with streaky bandlike opacities in the bases favoring atelectasis. 2. Mild gaseous distention of the stomach and colon is nonspecific. Correlate with abdominal symptoms. 3.  Aortic Atherosclerosis (ICD10-I70.0). Electronically Signed   By: Lovena Le M.D.   On: Jan 20, 2020 00:50    Microbiology: Recent Results (from the past 240 hour(s))  SARS Coronavirus 2 by RT PCR (hospital order, performed in [facility] hospital lab) Nasopharyngeal Nasopharyngeal Swab     Status: None   Collection Time:  01-20-2020  1:52 AM   Specimen: Nasopharyngeal Swab  Result Value Ref Range Status   SARS Coronavirus 2 NEGATIVE NEGATIVE Final    Comment: (NOTE) SARS-CoV-2 target nucleic acids are NOT DETECTED. The SARS-CoV-2 RNA is generally detectable in upper and lower respiratory specimens during the acute phase of infection. The lowest concentration of SARS-CoV-2 viral copies this assay can detect is 250 copies / mL. A negative result does not preclude SARS-CoV-2 infection and should not be used as the sole basis for treatment or other patient management decisions.  A negative result may occur with improper specimen collection / handling, submission of specimen other than nasopharyngeal swab, presence of viral mutation(s) within the areas targeted by this assay, and inadequate number of viral copies (<250 copies / mL). A negative result must be combined with clinical observations, patient history, and epidemiological information. Fact Sheet for Patients:   StrictlyIdeas.no Fact Sheet for Healthcare Providers: BankingDealers.co.za This test is not yet approved or cleared  by the [place] FDA and has been authorized for detection and/or diagnosis of SARS-CoV-2 by FDA under an Emergency Use Authorization (EUA).  This EUA will remain in effect (meaning this test can be used) for the duration of the COVID-19 declaration under Section 564(b)(1) of the Act, 21 U.S.C. section 360bbb-3(b)(1), unless the authorization is terminated or revoked sooner. Performed at [facility], [address], [place], [place] [address]      Labs: Basic Metabolic Panel: Recent Labs  Lab 12/24/19 2345  NA 141  K 5.0  CL 91*  CO2 38*  GLUCOSE 183*  BUN 37*  CREATININE 1.25*  CALCIUM 9.6   Liver Function Tests: Recent Labs  Lab 12/24/19 2345  AST 44*  ALT 35  ALKPHOS 67  BILITOT 0.8  PROT 6.4*  ALBUMIN 4.0   No results for input(s): LIPASE, AMYLASE in  the last 168 hours.  No results for input(s): AMMONIA in the last 168 hours. CBC: Recent Labs  Lab 12/24/19 2345  WBC 8.6  NEUTROABS 7.8*  HGB 16.5  HCT 53.3*  MCV 98.3  PLT 327   Cardiac Enzymes: No results for input(s): CKTOTAL, CKMB, CKMBINDEX, TROPONINI in the last 168 hours. D-Dimer No results for input(s): DDIMER in the last 72 hours. BNP: Invalid input(s): POCBNP CBG: Recent Labs  Lab 12/20/19 1251 12/24/19 2341  GLUCAP 105* 169*   Anemia work up No results for input(s): VITAMINB12, FOLATE, FERRITIN, TIBC, IRON, RETICCTPCT in the last 72 hours. Urinalysis    Component Value Date/Time   COLORURINE AMBER (A) 12/24/2019 2346   APPEARANCEUR CLOUDY (A) 12/24/2019 2346   APPEARANCEUR Clear 10/23/2017 1206   LABSPEC 1.021 12/24/2019 2346   PHURINE 5.0 12/24/2019 2346   GLUCOSEU NEGATIVE 12/24/2019 2346   HGBUR LARGE (A) 12/24/2019 2346   BILIRUBINUR SMALL (A) 12/24/2019 2346   BILIRUBINUR Negative 10/23/2017 1206   KETONESUR 5 (A) 12/24/2019 2346   PROTEINUR 100 (A) 12/24/2019 2346   UROBILINOGEN negative 04/28/2014 1252   NITRITE NEGATIVE 12/24/2019 2346   LEUKOCYTESUR NEGATIVE 12/24/2019 2346   Sepsis Labs Invalid input(s): PROCALCITONIN,  WBC,  LACTICIDVEN     SIGNED:  Georgette Shell, MD  [facility] 2020/01/12, 1:03 PM   If 7PM-7AM, please contact night-coverage [email or web address] Password [email or web address]

## 2020-01-19 DEATH — deceased

## 2020-01-27 ENCOUNTER — Ambulatory Visit: Payer: Medicare Other | Admitting: Nurse Practitioner

## 2020-01-27 ENCOUNTER — Other Ambulatory Visit: Payer: Medicare Other

## 2020-01-27 ENCOUNTER — Ambulatory Visit: Payer: Medicare Other

## 2020-03-15 NOTE — Addendum Note (Signed)
Addended by: Sandie Ano D on: 03/15/2020 02:01 PM   Modules accepted: Orders

## 2021-12-05 IMAGING — CT NM PET TUM IMG INITIAL (PI) WHOLE BODY
1 of 7 series · 3 of 25 positions shown · non-contrast
Comparison: CT abdomen/pelvis from 08/06/2003

CLINICAL DATA: Initial treatment strategy for melanoma of the left
mid upper back.

EXAM:
NUCLEAR MEDICINE PET WHOLE BODY
TECHNIQUE: 8.2 mCi F-18 FDG was injected intravenously. Full-ring PET imaging
was performed from the skull vertex through the feet after the
radiotracer. CT data was obtained and used for attenuation
correction and anatomic localization.
Fasting blood glucose: 95 mg/dl

[Series 4: ct wb 5.0 hd_fov · axial · 5.0mm · 1.17mm/px · z∈[+276,+1176]mm · 3 of 416 slices shown]
[im 104/416  soft-tissue]
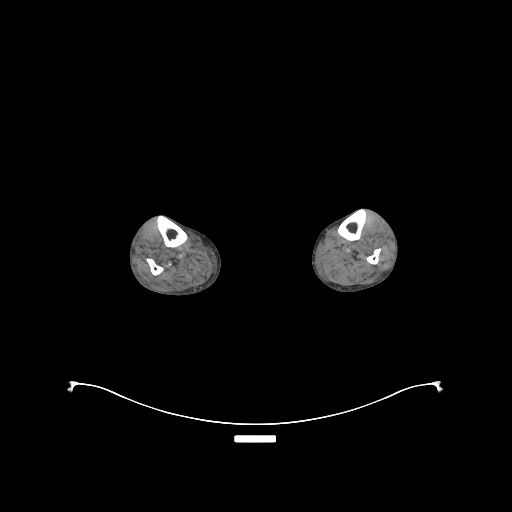
[im 208/416  soft-tissue]
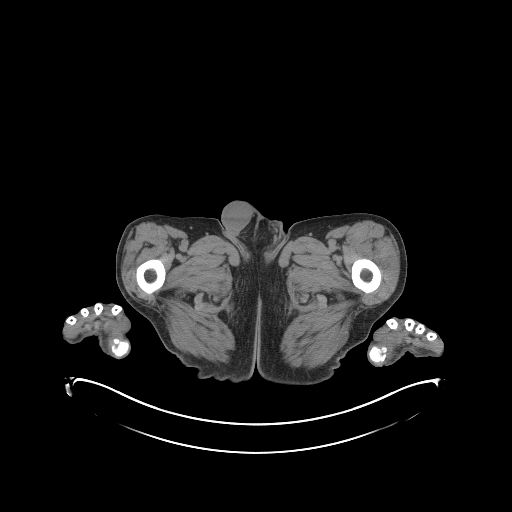
[im 312/416  soft-tissue]
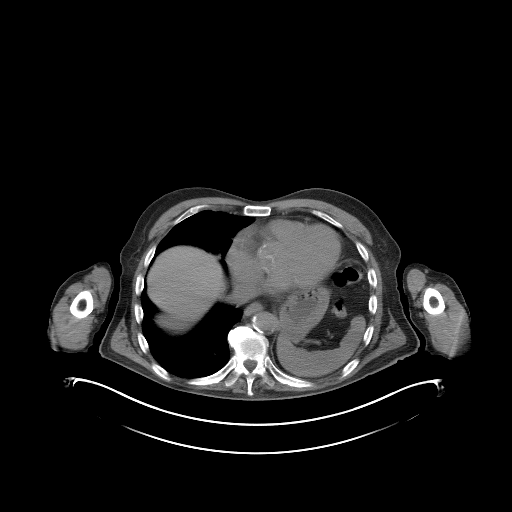

[3 of 25 positions shown; findings below may reference images not displayed]

FINDINGS: Mediastinal blood pool activity: SUV max

HEAD/NECK: No significant abnormal hypermetabolic activity in this
region.

Incidental CT findings: Right common carotid atherosclerotic
calcification.

CHEST: There is a bandlike collection of over 10 small
hypermetabolic subcutaneous tumors resembling a string of beads
extending from the left upper back around 2 the left axilla towards
several abnormally enlarged left axillary lymph nodes. The lower
medial subcutaneous tumor in this vicinity extends to the cutaneous
margin at measures 2.3 by 1.3 cm on image 89/4, with maximum SUV of
17.5. An index left axillary lymph node measuring 1.5 cm in short
axis also on image 89/4 has a maximum SUV of 16.2.

Incidental CT findings: Coronary, aortic arch, and branch vessel
atherosclerotic vascular disease. Mildly elevated left hemidiaphragm
with atelectasis in the left lower lobe. Linear subsegmental
atelectasis or scarring medially in the right middle lobe.

ABDOMEN/PELVIS: No significant abnormal hypermetabolic activity in
this region.

Incidental CT findings: Bilateral nonobstructive nephrolithiasis.
Aortoiliac atherosclerotic vascular disease. Small bilateral
indirect inguinal hernias contain adipose tissue.

SKELETON: No significant abnormal hypermetabolic activity in this
region.

Incidental CT findings: Degenerative arthropathy in the knees, with
lateral patellofemoral articular space narrowing, left greater than
right. Multilevel postoperative findings in the lumbar spine.

EXTREMITIES: No significant abnormal hypermetabolic activity in this
region.

Incidental CT findings: Small bilateral knee joint effusions.
Peripheral vascular atherosclerotic calcifications.
IMPRESSION: 1. Bandlike collection of over 10 small hypermetabolic subcutaneous
tumors along the left upper back extending around to the left axilla
where there are several abnormally hypermetabolic left axillary
lymph nodes. The pattern of distribution resembles a string of
beads. Appearance compatible with active malignancy with
subcutaneous tumors and axillary adenopathy. Please note that if the
patient has received a [id number] vaccine recently in the left upper
extremity, that can also cause false positive axillary adenopathy.
However, in this case, the presence of the subcutaneous lesions as
well as the history of a left upper back melanoma correlating to the
left upper back lesions appears definitive for malignant
involvement.
2. Other imaging findings of potential clinical significance: Aortic
Atherosclerosis ([id number].F). Coronary atherosclerosis. Atelectasis
in the left lower lobe with mildly elevated left hemidiaphragm.
Bilateral nonobstructive nephrolithiasis. Small bilateral indirect
inguinal hernias contain adipose tissue. Degenerative arthropathy of
both knees.

## 2023-06-10 NOTE — Telephone Encounter (Signed)
Telephone call
